# Patient Record
Sex: Male | Born: 1940 | Race: White | Hispanic: No | Marital: Married | Smoking: Former smoker
Health system: Southern US, Community
[De-identification: ages and names within clinical notes are randomized; demographics above are authoritative.]

## PROBLEM LIST (undated history)

## (undated) DIAGNOSIS — M1990 Unspecified osteoarthritis, unspecified site: Secondary | ICD-10-CM

## (undated) DIAGNOSIS — F909 Attention-deficit hyperactivity disorder, unspecified type: Secondary | ICD-10-CM

## (undated) DIAGNOSIS — F32A Depression, unspecified: Secondary | ICD-10-CM

## (undated) DIAGNOSIS — K635 Polyp of colon: Secondary | ICD-10-CM

## (undated) DIAGNOSIS — N200 Calculus of kidney: Secondary | ICD-10-CM

## (undated) DIAGNOSIS — I10 Essential (primary) hypertension: Secondary | ICD-10-CM

## (undated) DIAGNOSIS — R7301 Impaired fasting glucose: Secondary | ICD-10-CM

## (undated) DIAGNOSIS — R972 Elevated prostate specific antigen [PSA]: Secondary | ICD-10-CM

## (undated) DIAGNOSIS — C159 Malignant neoplasm of esophagus, unspecified: Secondary | ICD-10-CM

## (undated) DIAGNOSIS — I6529 Occlusion and stenosis of unspecified carotid artery: Secondary | ICD-10-CM

## (undated) DIAGNOSIS — H543 Unqualified visual loss, both eyes: Secondary | ICD-10-CM

## (undated) DIAGNOSIS — F329 Major depressive disorder, single episode, unspecified: Secondary | ICD-10-CM

## (undated) DIAGNOSIS — N400 Enlarged prostate without lower urinary tract symptoms: Secondary | ICD-10-CM

## (undated) DIAGNOSIS — E782 Mixed hyperlipidemia: Secondary | ICD-10-CM

## (undated) HISTORY — DX: Occlusion and stenosis of unspecified carotid artery: I65.29

## (undated) HISTORY — DX: Polyp of colon: K63.5

## (undated) HISTORY — DX: Calculus of kidney: N20.0

## (undated) HISTORY — DX: Impaired fasting glucose: R73.01

## (undated) HISTORY — DX: Essential (primary) hypertension: I10

## (undated) HISTORY — DX: Benign prostatic hyperplasia without lower urinary tract symptoms: N40.0

## (undated) HISTORY — DX: Major depressive disorder, single episode, unspecified: F32.9

## (undated) HISTORY — DX: Mixed hyperlipidemia: E78.2

## (undated) HISTORY — DX: Unqualified visual loss, both eyes: H54.3

## (undated) HISTORY — DX: Elevated prostate specific antigen (PSA): R97.20

## (undated) HISTORY — PX: COLONOSCOPY: SHX174

## (undated) HISTORY — DX: Attention-deficit hyperactivity disorder, unspecified type: F90.9

## (undated) HISTORY — DX: Depression, unspecified: F32.A

## (undated) HISTORY — DX: Unspecified osteoarthritis, unspecified site: M19.90

---
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
## 1995-01-09 HISTORY — PX: LITHOTRIPSY: SUR834

## 1996-01-09 HISTORY — PX: CORONARY ANGIOGRAPHY: CATH118303

## 2002-05-14 ENCOUNTER — Encounter: Admission: RE | Admit: 2002-05-14 | Discharge: 2002-05-14 | Payer: Self-pay | Admitting: Family Medicine

## 2002-05-14 ENCOUNTER — Encounter: Payer: Self-pay | Admitting: Family Medicine

## 2002-05-21 ENCOUNTER — Ambulatory Visit (HOSPITAL_COMMUNITY): Admission: RE | Admit: 2002-05-21 | Discharge: 2002-05-21 | Payer: Self-pay | Admitting: Gastroenterology

## 2002-05-21 ENCOUNTER — Encounter (INDEPENDENT_AMBULATORY_CARE_PROVIDER_SITE_OTHER): Payer: Self-pay | Admitting: Specialist

## 2002-05-23 ENCOUNTER — Ambulatory Visit (HOSPITAL_BASED_OUTPATIENT_CLINIC_OR_DEPARTMENT_OTHER): Admission: RE | Admit: 2002-05-23 | Discharge: 2002-05-23 | Payer: Self-pay | Admitting: Family Medicine

## 2002-05-26 ENCOUNTER — Encounter: Admission: RE | Admit: 2002-05-26 | Discharge: 2002-05-26 | Payer: Self-pay | Admitting: Family Medicine

## 2002-05-26 ENCOUNTER — Encounter: Payer: Self-pay | Admitting: Family Medicine

## 2002-12-17 ENCOUNTER — Encounter: Admission: RE | Admit: 2002-12-17 | Discharge: 2002-12-17 | Payer: Self-pay | Admitting: Family Medicine

## 2008-01-03 ENCOUNTER — Encounter: Admission: RE | Admit: 2008-01-03 | Discharge: 2008-01-03 | Payer: Self-pay | Admitting: Family Medicine

## 2008-01-09 HISTORY — PX: REPLACEMENT TOTAL KNEE: SUR1224

## 2008-09-22 ENCOUNTER — Inpatient Hospital Stay (HOSPITAL_COMMUNITY): Admission: RE | Admit: 2008-09-22 | Discharge: 2008-09-25 | Payer: Self-pay | Admitting: Orthopedic Surgery

## 2009-11-15 ENCOUNTER — Encounter: Admission: RE | Admit: 2009-11-15 | Discharge: 2009-11-15 | Payer: Self-pay | Admitting: Family Medicine

## 2010-04-14 LAB — CBC
HCT: 24.5 % — ABNORMAL LOW (ref 39.0–52.0)
HCT: 28.1 % — ABNORMAL LOW (ref 39.0–52.0)
HCT: 30.4 % — ABNORMAL LOW (ref 39.0–52.0)
HCT: 41.5 % (ref 39.0–52.0)
Hemoglobin: 10.6 g/dL — ABNORMAL LOW (ref 13.0–17.0)
Hemoglobin: 8.7 g/dL — ABNORMAL LOW (ref 13.0–17.0)
Hemoglobin: 9.8 g/dL — ABNORMAL LOW (ref 13.0–17.0)
Hemoglobin: 14.3 g/dL (ref 13.0–17.0)
MCHC: 34.9 g/dL (ref 30.0–36.0)
MCHC: 34.9 g/dL (ref 30.0–36.0)
MCHC: 35.5 g/dL (ref 30.0–36.0)
MCHC: 34.3 g/dL (ref 30.0–36.0)
MCV: 92.8 fL (ref 78.0–100.0)
MCV: 94.2 fL (ref 78.0–100.0)
MCV: 95.2 fL (ref 78.0–100.0)
MCV: 95.2 fL (ref 78.0–100.0)
Platelets: 137 10*3/uL — ABNORMAL LOW (ref 150–400)
Platelets: 146 10*3/uL — ABNORMAL LOW (ref 150–400)
Platelets: 159 10*3/uL (ref 150–400)
Platelets: 230 10*3/uL (ref 150–400)
RBC: 2.6 MIL/uL — ABNORMAL LOW (ref 4.22–5.81)
RBC: 3.03 MIL/uL — ABNORMAL LOW (ref 4.22–5.81)
RBC: 3.19 MIL/uL — ABNORMAL LOW (ref 4.22–5.81)
RBC: 4.36 MIL/uL (ref 4.22–5.81)
RDW: 13.4 % (ref 11.5–15.5)
RDW: 13.7 % (ref 11.5–15.5)
RDW: 14.4 % (ref 11.5–15.5)
RDW: 13.5 % (ref 11.5–15.5)
WBC: 10.2 10*3/uL (ref 4.0–10.5)
WBC: 8.8 10*3/uL (ref 4.0–10.5)
WBC: 8.9 10*3/uL (ref 4.0–10.5)
WBC: 6.8 10*3/uL (ref 4.0–10.5)

## 2010-04-14 LAB — URINALYSIS, ROUTINE W REFLEX MICROSCOPIC
Bilirubin Urine: NEGATIVE
Glucose, UA: NEGATIVE mg/dL
Hgb urine dipstick: NEGATIVE
Ketones, ur: NEGATIVE mg/dL
Nitrite: NEGATIVE
Protein, ur: NEGATIVE mg/dL
Specific Gravity, Urine: 1.016 (ref 1.005–1.030)
Urobilinogen, UA: 1 mg/dL (ref 0.0–1.0)
pH: 5.5 (ref 5.0–8.0)

## 2010-04-14 LAB — BASIC METABOLIC PANEL
BUN: 13 mg/dL (ref 6–23)
BUN: 14 mg/dL (ref 6–23)
BUN: 15 mg/dL (ref 6–23)
CO2: 28 mEq/L (ref 19–32)
CO2: 28 mEq/L (ref 19–32)
CO2: 28 mEq/L (ref 19–32)
Calcium: 8.1 mg/dL — ABNORMAL LOW (ref 8.4–10.5)
Calcium: 8.1 mg/dL — ABNORMAL LOW (ref 8.4–10.5)
Calcium: 8.5 mg/dL (ref 8.4–10.5)
Chloride: 102 mEq/L (ref 96–112)
Chloride: 104 mEq/L (ref 96–112)
Chloride: 96 mEq/L (ref 96–112)
Creatinine, Ser: 1.04 mg/dL (ref 0.4–1.5)
Creatinine, Ser: 1.11 mg/dL (ref 0.4–1.5)
Creatinine, Ser: 1.12 mg/dL (ref 0.4–1.5)
GFR calc Af Amer: >60 mL/min (ref >60)
GFR calc Af Amer: >60 mL/min (ref >60)
GFR calc Af Amer: >60 mL/min (ref >60)
GFR calc non Af Amer: >60 mL/min (ref >60)
GFR calc non Af Amer: >60 mL/min (ref >60)
GFR calc non Af Amer: >60 mL/min (ref >60)
Glucose, Bld: 122 mg/dL — ABNORMAL HIGH (ref 70–99)
Glucose, Bld: 138 mg/dL — ABNORMAL HIGH (ref 70–99)
Glucose, Bld: 146 mg/dL — ABNORMAL HIGH (ref 70–99)
Potassium: 3.3 mEq/L — ABNORMAL LOW (ref 3.5–5.1)
Potassium: 3.7 mEq/L (ref 3.5–5.1)
Potassium: 3.9 mEq/L (ref 3.5–5.1)
Sodium: 132 mEq/L — ABNORMAL LOW (ref 135–145)
Sodium: 138 mEq/L (ref 135–145)
Sodium: 138 mEq/L (ref 135–145)

## 2010-04-14 LAB — COMPREHENSIVE METABOLIC PANEL
ALT: 36 U/L (ref 0–53)
AST: 37 U/L (ref 0–37)
Albumin: 4.4 g/dL (ref 3.5–5.2)
Alkaline Phosphatase: 72 U/L (ref 39–117)
BUN: 12 mg/dL (ref 6–23)
CO2: 27 mEq/L (ref 19–32)
Calcium: 9.5 mg/dL (ref 8.4–10.5)
Chloride: 103 mEq/L (ref 96–112)
Creatinine, Ser: 0.99 mg/dL (ref 0.4–1.5)
GFR calc Af Amer: >60 mL/min (ref >60)
GFR calc non Af Amer: >60 mL/min (ref >60)
Glucose, Bld: 116 mg/dL — ABNORMAL HIGH (ref 70–99)
Potassium: 4.4 mEq/L (ref 3.5–5.1)
Sodium: 137 mEq/L (ref 135–145)
Total Bilirubin: 0.7 mg/dL (ref 0.3–1.2)
Total Protein: 7.1 g/dL (ref 6.0–8.3)

## 2010-04-14 LAB — TYPE AND SCREEN
ABO/RH(D): O NEG
Antibody Screen: NEGATIVE

## 2010-04-14 LAB — PREPARE RBC (CROSSMATCH)

## 2010-04-14 LAB — PROTIME-INR
INR: 1.1 (ref 0.00–1.49)
Prothrombin Time: 14.2 seconds (ref 11.6–15.2)

## 2010-04-14 LAB — APTT: aPTT: 30 seconds (ref 24–37)

## 2010-04-14 LAB — ABO/RH: ABO/RH(D): O NEG

## 2010-05-26 NOTE — Op Note (Signed)
NAME:  Colin Villa, Colin Villa                          ACCOUNT NO.:  [id number]   MEDICAL RECORD NO.:  [id number]                   PATIENT TYPE:  AMB   LOCATION:  ENDO                                 FACILITY:  [facility]   PHYSICIAN:  Petra Kuba, M.D.                 DATE OF BIRTH:  November 06, 1940   DATE OF PROCEDURE:  05/21/2002  DATE OF DISCHARGE:                                 OPERATIVE REPORT   PROCEDURE:  Colonoscopy and polypectomy.   INDICATION:  Mild anemia.  Consent was signed after risks, benefits,  methods, options thoroughly discussed in the office.   MEDICINES USED:  1. Demerol 50.  2. Versed 5.   DESCRIPTION OF PROCEDURE:  Rectal inspection was pertinent for external  hemorrhoids, small.  Digital exam was negative.  The video colonoscope was  inserted, easily advanced around the colon to the cecum.  This did require  some left-sided pressure but no position changes.  No obvious abnormalities  were seen on insertion.  The cecum was identified by the appendiceal orifice  as well as the ileocecal valve.  In fact, the scope was inserted a short  ways into the terminal ileum which was normal.  Photodocumentation was  obtained.  The scope was slowly withdrawn.  Prep was adequate.  There was  some liquid stool that required washing and suctioning but on slow  withdrawal through the colon, the cecum, ascending, and transverse were  normal.  The scope was withdrawn around the descending.  Three tiny to small  polyps were seen and were all hot biopsied x 1.  The scope was further  withdrawn.  No sigmoid abnormalities were seen.  However, in the proximal  rectum, another small polyp was seen and was hot biopsied x 2.  The scope  was then retroflexed, pertinent for some internal hemorrhoids.  The scope  was straightened and readvanced a short ways up the left side of the colon;  air was suctioned and the scope removed.  The patient tolerated the  procedure well.  There was no obvious  immediate complication.   ENDOSCOPIC DIAGNOSES:  1. Internal-external hemorrhoids.  2. Four tiny to small polyps, hot biopsied; one in the proximal rectum,     three in the descending.  3. Otherwise within normal limits to the terminal ileum.    PLAN:  1. Await pathology to determine future colonic screening.  2. Follow up p.r.n. or in two months to recheck guaiacs, CBC, symptoms, make     sure no further work-up plans are needed.  In the meantime, await CAT     scan to be done next week for his abnormal kidney ultrasound.  Petra Kuba, M.D.    MEM/MEDQ  D:  05/21/2002  T:  05/21/2002  Job:  [id number]   cc:   Chales Salmon. Abigail Miyamoto, M.D.  [address]  [place]  [place] [address]  Fax: [phone]

## 2011-01-31 DIAGNOSIS — F322 Major depressive disorder, single episode, severe without psychotic features: Secondary | ICD-10-CM | POA: Diagnosis not present

## 2011-03-07 DIAGNOSIS — F322 Major depressive disorder, single episode, severe without psychotic features: Secondary | ICD-10-CM | POA: Diagnosis not present

## 2011-04-04 DIAGNOSIS — R7301 Impaired fasting glucose: Secondary | ICD-10-CM | POA: Diagnosis not present

## 2011-04-20 DIAGNOSIS — T8489XA Other specified complication of internal orthopedic prosthetic devices, implants and grafts, initial encounter: Secondary | ICD-10-CM | POA: Diagnosis not present

## 2011-05-22 DIAGNOSIS — T8489XA Other specified complication of internal orthopedic prosthetic devices, implants and grafts, initial encounter: Secondary | ICD-10-CM | POA: Diagnosis not present

## 2011-05-22 DIAGNOSIS — F322 Major depressive disorder, single episode, severe without psychotic features: Secondary | ICD-10-CM | POA: Diagnosis not present

## 2011-06-11 DIAGNOSIS — R972 Elevated prostate specific antigen [PSA]: Secondary | ICD-10-CM | POA: Diagnosis not present

## 2011-07-18 DIAGNOSIS — N401 Enlarged prostate with lower urinary tract symptoms: Secondary | ICD-10-CM | POA: Diagnosis not present

## 2011-07-18 DIAGNOSIS — R972 Elevated prostate specific antigen [PSA]: Secondary | ICD-10-CM | POA: Diagnosis not present

## 2011-08-14 DIAGNOSIS — F322 Major depressive disorder, single episode, severe without psychotic features: Secondary | ICD-10-CM | POA: Diagnosis not present

## 2011-08-22 DIAGNOSIS — F322 Major depressive disorder, single episode, severe without psychotic features: Secondary | ICD-10-CM | POA: Diagnosis not present

## 2011-09-25 DIAGNOSIS — R7301 Impaired fasting glucose: Secondary | ICD-10-CM | POA: Diagnosis not present

## 2011-09-25 DIAGNOSIS — Z125 Encounter for screening for malignant neoplasm of prostate: Secondary | ICD-10-CM | POA: Diagnosis not present

## 2011-09-25 DIAGNOSIS — R799 Abnormal finding of blood chemistry, unspecified: Secondary | ICD-10-CM | POA: Diagnosis not present

## 2011-09-25 DIAGNOSIS — I10 Essential (primary) hypertension: Secondary | ICD-10-CM | POA: Diagnosis not present

## 2011-09-26 DIAGNOSIS — Z0000 Encounter for general adult medical examination without abnormal findings: Secondary | ICD-10-CM | POA: Diagnosis not present

## 2011-09-26 DIAGNOSIS — R5381 Other malaise: Secondary | ICD-10-CM | POA: Diagnosis not present

## 2011-09-26 DIAGNOSIS — I10 Essential (primary) hypertension: Secondary | ICD-10-CM | POA: Diagnosis not present

## 2011-09-26 DIAGNOSIS — Z23 Encounter for immunization: Secondary | ICD-10-CM | POA: Diagnosis not present

## 2011-09-26 DIAGNOSIS — F322 Major depressive disorder, single episode, severe without psychotic features: Secondary | ICD-10-CM | POA: Diagnosis not present

## 2011-09-26 DIAGNOSIS — E782 Mixed hyperlipidemia: Secondary | ICD-10-CM | POA: Diagnosis not present

## 2011-09-26 DIAGNOSIS — R972 Elevated prostate specific antigen [PSA]: Secondary | ICD-10-CM | POA: Diagnosis not present

## 2011-09-26 DIAGNOSIS — R7301 Impaired fasting glucose: Secondary | ICD-10-CM | POA: Diagnosis not present

## 2012-01-16 DIAGNOSIS — R972 Elevated prostate specific antigen [PSA]: Secondary | ICD-10-CM | POA: Diagnosis not present

## 2012-03-13 DIAGNOSIS — F322 Major depressive disorder, single episode, severe without psychotic features: Secondary | ICD-10-CM | POA: Diagnosis not present

## 2012-04-10 DIAGNOSIS — H2510 Age-related nuclear cataract, unspecified eye: Secondary | ICD-10-CM | POA: Diagnosis not present

## 2012-04-30 DIAGNOSIS — N200 Calculus of kidney: Secondary | ICD-10-CM | POA: Diagnosis not present

## 2012-04-30 DIAGNOSIS — R5383 Other fatigue: Secondary | ICD-10-CM | POA: Diagnosis not present

## 2012-04-30 DIAGNOSIS — E782 Mixed hyperlipidemia: Secondary | ICD-10-CM | POA: Diagnosis not present

## 2012-04-30 DIAGNOSIS — N400 Enlarged prostate without lower urinary tract symptoms: Secondary | ICD-10-CM | POA: Diagnosis not present

## 2012-04-30 DIAGNOSIS — I10 Essential (primary) hypertension: Secondary | ICD-10-CM | POA: Diagnosis not present

## 2012-04-30 DIAGNOSIS — R7301 Impaired fasting glucose: Secondary | ICD-10-CM | POA: Diagnosis not present

## 2012-04-30 DIAGNOSIS — R5381 Other malaise: Secondary | ICD-10-CM | POA: Diagnosis not present

## 2012-08-13 DIAGNOSIS — L821 Other seborrheic keratosis: Secondary | ICD-10-CM | POA: Diagnosis not present

## 2012-08-13 DIAGNOSIS — L738 Other specified follicular disorders: Secondary | ICD-10-CM | POA: Diagnosis not present

## 2012-08-13 DIAGNOSIS — L219 Seborrheic dermatitis, unspecified: Secondary | ICD-10-CM | POA: Diagnosis not present

## 2012-08-13 DIAGNOSIS — L570 Actinic keratosis: Secondary | ICD-10-CM | POA: Diagnosis not present

## 2012-08-13 DIAGNOSIS — D485 Neoplasm of uncertain behavior of skin: Secondary | ICD-10-CM | POA: Diagnosis not present

## 2012-09-03 DIAGNOSIS — F322 Major depressive disorder, single episode, severe without psychotic features: Secondary | ICD-10-CM | POA: Diagnosis not present

## 2012-10-10 DIAGNOSIS — N200 Calculus of kidney: Secondary | ICD-10-CM | POA: Diagnosis not present

## 2012-10-10 DIAGNOSIS — I10 Essential (primary) hypertension: Secondary | ICD-10-CM | POA: Diagnosis not present

## 2012-10-10 DIAGNOSIS — M1990 Unspecified osteoarthritis, unspecified site: Secondary | ICD-10-CM | POA: Diagnosis not present

## 2012-10-10 DIAGNOSIS — Z0000 Encounter for general adult medical examination without abnormal findings: Secondary | ICD-10-CM | POA: Diagnosis not present

## 2012-10-10 DIAGNOSIS — Z125 Encounter for screening for malignant neoplasm of prostate: Secondary | ICD-10-CM | POA: Diagnosis not present

## 2012-10-10 DIAGNOSIS — Z1331 Encounter for screening for depression: Secondary | ICD-10-CM | POA: Diagnosis not present

## 2012-10-10 DIAGNOSIS — D126 Benign neoplasm of colon, unspecified: Secondary | ICD-10-CM | POA: Diagnosis not present

## 2012-10-10 DIAGNOSIS — E782 Mixed hyperlipidemia: Secondary | ICD-10-CM | POA: Diagnosis not present

## 2012-10-10 DIAGNOSIS — R7301 Impaired fasting glucose: Secondary | ICD-10-CM | POA: Diagnosis not present

## 2012-10-10 DIAGNOSIS — F329 Major depressive disorder, single episode, unspecified: Secondary | ICD-10-CM | POA: Diagnosis not present

## 2012-10-16 DIAGNOSIS — Z23 Encounter for immunization: Secondary | ICD-10-CM | POA: Diagnosis not present

## 2012-10-28 DIAGNOSIS — R972 Elevated prostate specific antigen [PSA]: Secondary | ICD-10-CM | POA: Diagnosis not present

## 2012-11-18 DIAGNOSIS — R972 Elevated prostate specific antigen [PSA]: Secondary | ICD-10-CM | POA: Diagnosis not present

## 2012-11-27 DIAGNOSIS — R972 Elevated prostate specific antigen [PSA]: Secondary | ICD-10-CM | POA: Diagnosis not present

## 2012-12-23 DIAGNOSIS — Z299 Encounter for prophylactic measures, unspecified: Secondary | ICD-10-CM | POA: Diagnosis not present

## 2012-12-23 DIAGNOSIS — R972 Elevated prostate specific antigen [PSA]: Secondary | ICD-10-CM | POA: Diagnosis not present

## 2013-02-05 DIAGNOSIS — F322 Major depressive disorder, single episode, severe without psychotic features: Secondary | ICD-10-CM | POA: Diagnosis not present

## 2013-05-19 DIAGNOSIS — F322 Major depressive disorder, single episode, severe without psychotic features: Secondary | ICD-10-CM | POA: Diagnosis not present

## 2013-06-16 DIAGNOSIS — R972 Elevated prostate specific antigen [PSA]: Secondary | ICD-10-CM | POA: Diagnosis not present

## 2013-06-16 DIAGNOSIS — N401 Enlarged prostate with lower urinary tract symptoms: Secondary | ICD-10-CM | POA: Diagnosis not present

## 2013-06-16 DIAGNOSIS — N139 Obstructive and reflux uropathy, unspecified: Secondary | ICD-10-CM | POA: Diagnosis not present

## 2013-06-23 DIAGNOSIS — N401 Enlarged prostate with lower urinary tract symptoms: Secondary | ICD-10-CM | POA: Diagnosis not present

## 2013-06-23 DIAGNOSIS — N139 Obstructive and reflux uropathy, unspecified: Secondary | ICD-10-CM | POA: Diagnosis not present

## 2013-06-23 DIAGNOSIS — R351 Nocturia: Secondary | ICD-10-CM | POA: Diagnosis not present

## 2013-07-12 DIAGNOSIS — R05 Cough: Secondary | ICD-10-CM | POA: Diagnosis not present

## 2013-07-12 DIAGNOSIS — R059 Cough, unspecified: Secondary | ICD-10-CM | POA: Diagnosis not present

## 2013-07-23 DIAGNOSIS — F322 Major depressive disorder, single episode, severe without psychotic features: Secondary | ICD-10-CM | POA: Diagnosis not present

## 2013-08-11 DIAGNOSIS — H2510 Age-related nuclear cataract, unspecified eye: Secondary | ICD-10-CM | POA: Diagnosis not present

## 2013-10-06 DIAGNOSIS — D126 Benign neoplasm of colon, unspecified: Secondary | ICD-10-CM | POA: Diagnosis not present

## 2013-10-06 DIAGNOSIS — Z09 Encounter for follow-up examination after completed treatment for conditions other than malignant neoplasm: Secondary | ICD-10-CM | POA: Diagnosis not present

## 2013-10-06 DIAGNOSIS — Z86010 Personal history of colonic polyps: Secondary | ICD-10-CM | POA: Diagnosis not present

## 2013-10-06 DIAGNOSIS — Z1211 Encounter for screening for malignant neoplasm of colon: Secondary | ICD-10-CM | POA: Diagnosis not present

## 2013-10-21 DIAGNOSIS — N200 Calculus of kidney: Secondary | ICD-10-CM | POA: Diagnosis not present

## 2013-10-21 DIAGNOSIS — K635 Polyp of colon: Secondary | ICD-10-CM | POA: Diagnosis not present

## 2013-10-21 DIAGNOSIS — M1990 Unspecified osteoarthritis, unspecified site: Secondary | ICD-10-CM | POA: Diagnosis not present

## 2013-10-21 DIAGNOSIS — E782 Mixed hyperlipidemia: Secondary | ICD-10-CM | POA: Diagnosis not present

## 2013-10-21 DIAGNOSIS — I10 Essential (primary) hypertension: Secondary | ICD-10-CM | POA: Diagnosis not present

## 2013-10-21 DIAGNOSIS — R7301 Impaired fasting glucose: Secondary | ICD-10-CM | POA: Diagnosis not present

## 2013-10-21 DIAGNOSIS — Z0000 Encounter for general adult medical examination without abnormal findings: Secondary | ICD-10-CM | POA: Diagnosis not present

## 2013-10-21 DIAGNOSIS — F329 Major depressive disorder, single episode, unspecified: Secondary | ICD-10-CM | POA: Diagnosis not present

## 2013-10-21 DIAGNOSIS — Z23 Encounter for immunization: Secondary | ICD-10-CM | POA: Diagnosis not present

## 2013-11-12 DIAGNOSIS — F322 Major depressive disorder, single episode, severe without psychotic features: Secondary | ICD-10-CM | POA: Diagnosis not present

## 2013-12-15 DIAGNOSIS — L853 Xerosis cutis: Secondary | ICD-10-CM | POA: Diagnosis not present

## 2013-12-15 DIAGNOSIS — L218 Other seborrheic dermatitis: Secondary | ICD-10-CM | POA: Diagnosis not present

## 2013-12-15 DIAGNOSIS — L821 Other seborrheic keratosis: Secondary | ICD-10-CM | POA: Diagnosis not present

## 2013-12-15 DIAGNOSIS — Z85828 Personal history of other malignant neoplasm of skin: Secondary | ICD-10-CM | POA: Diagnosis not present

## 2013-12-15 DIAGNOSIS — L570 Actinic keratosis: Secondary | ICD-10-CM | POA: Diagnosis not present

## 2013-12-31 ENCOUNTER — Encounter (HOSPITAL_BASED_OUTPATIENT_CLINIC_OR_DEPARTMENT_OTHER): Payer: Self-pay

## 2013-12-31 DIAGNOSIS — Y998 Other external cause status: Secondary | ICD-10-CM | POA: Diagnosis not present

## 2013-12-31 DIAGNOSIS — Y9289 Other specified places as the place of occurrence of the external cause: Secondary | ICD-10-CM | POA: Insufficient documentation

## 2013-12-31 DIAGNOSIS — Y9389 Activity, other specified: Secondary | ICD-10-CM | POA: Diagnosis not present

## 2013-12-31 DIAGNOSIS — S6991XA Unspecified injury of right wrist, hand and finger(s), initial encounter: Secondary | ICD-10-CM | POA: Diagnosis present

## 2013-12-31 DIAGNOSIS — I10 Essential (primary) hypertension: Secondary | ICD-10-CM | POA: Insufficient documentation

## 2013-12-31 DIAGNOSIS — S61200A Unspecified open wound of right index finger without damage to nail, initial encounter: Secondary | ICD-10-CM | POA: Diagnosis not present

## 2013-12-31 DIAGNOSIS — W452XXA Lid of can entering through skin, initial encounter: Secondary | ICD-10-CM | POA: Diagnosis not present

## 2013-12-31 DIAGNOSIS — S61210A Laceration without foreign body of right index finger without damage to nail, initial encounter: Secondary | ICD-10-CM | POA: Diagnosis not present

## 2013-12-31 NOTE — ED Notes (Addendum)
Right index finger lac-cut on can approx 30 min PTA-slight bleeding noted-gauze/kling dsg applied in triage

## 2014-01-01 ENCOUNTER — Emergency Department (HOSPITAL_BASED_OUTPATIENT_CLINIC_OR_DEPARTMENT_OTHER)
Admission: EM | Admit: 2014-01-01 | Discharge: 2014-01-01 | Disposition: A | Discharge status: Home / Self Care | Payer: Medicare Other | Attending: Emergency Medicine | Admitting: Emergency Medicine

## 2014-01-01 DIAGNOSIS — S61210A Laceration without foreign body of right index finger without damage to nail, initial encounter: Secondary | ICD-10-CM | POA: Diagnosis not present

## 2014-01-01 HISTORY — DX: Essential (primary) hypertension: I10

## 2014-01-01 MED ORDER — LIDOCAINE HCL (PF) 1 % IJ SOLN
5.0000 mL | Freq: Once | INTRAMUSCULAR | Status: AC
  Administered 2014-01-01: 5 mL

## 2014-01-01 MED ORDER — LIDOCAINE HCL (PF) 1 % IJ SOLN
INTRAMUSCULAR | Status: AC
  Administered 2014-01-01: 5 mL
  Filled 2014-01-01: qty 5

## 2014-01-01 NOTE — ED Provider Notes (Signed)
CSN: [id number]     Arrival date & time 12/31/13  2234 History   First MD Initiated Contact with Patient 01/01/14 0133     Chief Complaint  Patient presents with  . Extremity Laceration     (Consider location/radiation/quality/duration/timing/severity/associated sxs/prior Treatment) The history is provided by the patient.   73 year old male suffered a laceration to his right second finger from the lid of a can. He is up-to-date on tetanus immunizations. He was unable to get bleeding controlled at home so he came to the ED.  Past Medical History  Diagnosis Date  . Hypertension    Past Surgical History  Procedure Laterality Date  . Knee surgery     No family history on file. History  Substance Use Topics  . Smoking status: Never Smoker   . Smokeless tobacco: Not on file  . Alcohol Use: No    Review of Systems  All other systems reviewed and are negative.     Allergies  Sulfa antibiotics  Home Medications   Prior to Admission medications   Medication Sig Start Date End Date Taking? Authorizing Provider  AMLODIPINE BESYLATE PO Take by mouth.   Yes Historical Provider, MD  CLONIDINE HCL PO Take by mouth.   Yes Historical Provider, MD  PARoxetine HCl (PAXIL PO) Take by mouth.   Yes Historical Provider, MD   BP 146/74 mmHg  Pulse 56  Temp(Src) 97.9 F (36.6 C) (Oral)  Resp 16  Ht 5\' 7"  (1.702 m)  Wt 185 lb (83.915 kg)  BMI 28.97 kg/m2  SpO2 99% Physical Exam  Nursing note and vitals reviewed.  73 year old male, resting comfortably and in no acute distress. Vital signs are significant for mild hypertension and mild bradycardia. Oxygen saturation is 99%, which is normal. Head is normocephalic and atraumatic. PERRLA, EOMI. Oropharynx is clear. Neck is nontender and supple without adenopathy or JVD. Back is nontender and there is no CVA tenderness. Lungs are clear without rales, wheezes, or rhonchi. Chest is nontender. Heart has regular rate and rhythm without  murmur. Abdomen is soft, flat, nontender without masses or hepatosplenomegaly and peristalsis is normoactive. Extremities laceration is present over the volar surface of the right second finger middle phalanx. Laceration is oriented transversely. Distal sensation is normal and flexor tendon function is normal. Skin is warm and dry without rash. Neurologic: Mental status is normal, cranial nerves are intact, there are no motor or sensory deficits.  ED Course  Procedures (including critical care time) LACERATION REPAIR Performed by: TOIZT,IWPYK Authorized by: DXIPJ,ASNKN Consent: Verbal consent obtained. Risks and benefits: risks, benefits and alternatives were discussed Consent given by: patient Patient identity confirmed: provided demographic data Prepped and Draped in normal sterile fashion Wound explored  Laceration Location: Left second finger  Laceration Length: 4.0 cm  No Foreign Bodies seen or palpated  Anesthesia: local infiltration  Local anesthetic: lidocaine 1% without epinephrine  Anesthetic total: 3 ml  IAmount of cleaning: standard  Skin closure: Close   Number of sutures: 9  Technique: Simple interrupted sutures with 5-0 nylon   Patient tolerance: Patient tolerated the procedure well with no immediate complications.   MDM   Final diagnoses:  Laceration of second finger, right, initial encounter    Laceration of right second finger with suture repair.    Delora Fuel, MD 39/76/73 [id number]

## 2014-01-01 NOTE — Discharge Instructions (Signed)
Stitches need to be removed in 7-10 days. This can be done at her doctor's office, at an urgent care center, or at the emergency department.  Laceration Care, Adult A laceration is a cut or lesion that goes through all layers of the skin and into the tissue just beneath the skin. TREATMENT  Some lacerations may not require closure. Some lacerations may not be able to be closed due to an increased risk of infection. It is important to see your caregiver as soon as possible after an injury to minimize the risk of infection and maximize the opportunity for successful closure. If closure is appropriate, pain medicines may be given, if needed. The wound will be cleaned to help prevent infection. Your caregiver will use stitches (sutures), staples, wound glue (adhesive), or skin adhesive strips to repair the laceration. These tools bring the skin edges together to allow for faster healing and a better cosmetic outcome. However, all wounds will heal with a scar. Once the wound has healed, scarring can be minimized by covering the wound with sunscreen during the day for 1 full year. HOME CARE INSTRUCTIONS  For sutures or staples:  Keep the wound clean and dry.  If you were given a bandage (dressing), you should change it at least once a day. Also, change the dressing if it becomes wet or dirty, or as directed by your caregiver.  Wash the wound with soap and water 2 times a day. Rinse the wound off with water to remove all soap. Pat the wound dry with a clean towel.  After cleaning, apply a thin layer of the antibiotic ointment as recommended by your caregiver. This will help prevent infection and keep the dressing from sticking.  You may shower as usual after the first 24 hours. Do not soak the wound in water until the sutures are removed.  Only take over-the-counter or prescription medicines for pain, discomfort, or fever as directed by your caregiver.  Get your sutures or staples removed as directed  by your caregiver. For skin adhesive strips:  Keep the wound clean and dry.  Do not get the skin adhesive strips wet. You may bathe carefully, using caution to keep the wound dry.  If the wound gets wet, pat it dry with a clean towel.  Skin adhesive strips will fall off on their own. You may trim the strips as the wound heals. Do not remove skin adhesive strips that are still stuck to the wound. They will fall off in time. For wound adhesive:  You may briefly wet your wound in the shower or bath. Do not soak or scrub the wound. Do not swim. Avoid periods of heavy perspiration until the skin adhesive has fallen off on its own. After showering or bathing, gently pat the wound dry with a clean towel.  Do not apply liquid medicine, cream medicine, or ointment medicine to your wound while the skin adhesive is in place. This may loosen the film before your wound is healed.  If a dressing is placed over the wound, be careful not to apply tape directly over the skin adhesive. This may cause the adhesive to be pulled off before the wound is healed.  Avoid prolonged exposure to sunlight or tanning lamps while the skin adhesive is in place. Exposure to ultraviolet light in the first year will darken the scar.  The skin adhesive will usually remain in place for 5 to 10 days, then naturally fall off the skin. Do not pick at the  adhesive film. You may need a tetanus shot if:  You cannot remember when you had your last tetanus shot.  You have never had a tetanus shot. If you get a tetanus shot, your arm may swell, get red, and feel warm to the touch. This is common and not a problem. If you need a tetanus shot and you choose not to have one, there is a rare chance of getting tetanus. Sickness from tetanus can be serious. SEEK MEDICAL CARE IF:   You have redness, swelling, or increasing pain in the wound.  You see a red line that goes away from the wound.  You have yellowish-white fluid (pus) coming  from the wound.  You have a fever.  You notice a bad smell coming from the wound or dressing.  Your wound breaks open before or after sutures have been removed.  You notice something coming out of the wound such as wood or glass.  Your wound is on your hand or foot and you cannot move a finger or toe. SEEK IMMEDIATE MEDICAL CARE IF:   Your pain is not controlled with prescribed medicine.  You have severe swelling around the wound causing pain and numbness or a change in color in your arm, hand, leg, or foot.  Your wound splits open and starts bleeding.  You have worsening numbness, weakness, or loss of function of any joint around or beyond the wound.  You develop painful lumps near the wound or on the skin anywhere on your body. MAKE SURE YOU:   Understand these instructions.  Will watch your condition.  Will get help right away if you are not doing well or get worse. Document Released: 12/25/2004 Document Revised: 03/19/2011 Document Reviewed: 06/20/2010 [facility] Patient Information 2015 [place], [place]. This information is not intended to replace advice given to you by your health care provider. Make sure you discuss any questions you have with your health care provider.

## 2014-01-01 NOTE — ED Notes (Signed)
MD at bedside. 

## 2014-01-04 DIAGNOSIS — Z23 Encounter for immunization: Secondary | ICD-10-CM | POA: Diagnosis not present

## 2014-05-19 DIAGNOSIS — F322 Major depressive disorder, single episode, severe without psychotic features: Secondary | ICD-10-CM | POA: Diagnosis not present

## 2014-06-17 DIAGNOSIS — R351 Nocturia: Secondary | ICD-10-CM | POA: Diagnosis not present

## 2014-06-17 DIAGNOSIS — N401 Enlarged prostate with lower urinary tract symptoms: Secondary | ICD-10-CM | POA: Diagnosis not present

## 2014-06-17 DIAGNOSIS — Z125 Encounter for screening for malignant neoplasm of prostate: Secondary | ICD-10-CM | POA: Diagnosis not present

## 2014-06-24 DIAGNOSIS — R972 Elevated prostate specific antigen [PSA]: Secondary | ICD-10-CM | POA: Diagnosis not present

## 2014-06-24 DIAGNOSIS — N401 Enlarged prostate with lower urinary tract symptoms: Secondary | ICD-10-CM | POA: Diagnosis not present

## 2014-06-24 DIAGNOSIS — R351 Nocturia: Secondary | ICD-10-CM | POA: Diagnosis not present

## 2014-09-07 DIAGNOSIS — F322 Major depressive disorder, single episode, severe without psychotic features: Secondary | ICD-10-CM | POA: Diagnosis not present

## 2014-11-09 DIAGNOSIS — H40012 Open angle with borderline findings, low risk, left eye: Secondary | ICD-10-CM | POA: Diagnosis not present

## 2014-11-09 DIAGNOSIS — H2513 Age-related nuclear cataract, bilateral: Secondary | ICD-10-CM | POA: Diagnosis not present

## 2014-11-09 DIAGNOSIS — D2312 Other benign neoplasm of skin of left eyelid, including canthus: Secondary | ICD-10-CM | POA: Diagnosis not present

## 2014-11-09 DIAGNOSIS — H43811 Vitreous degeneration, right eye: Secondary | ICD-10-CM | POA: Diagnosis not present

## 2014-11-09 DIAGNOSIS — H25013 Cortical age-related cataract, bilateral: Secondary | ICD-10-CM | POA: Diagnosis not present

## 2014-11-09 DIAGNOSIS — H40011 Open angle with borderline findings, low risk, right eye: Secondary | ICD-10-CM | POA: Diagnosis not present

## 2014-11-17 DIAGNOSIS — Z0000 Encounter for general adult medical examination without abnormal findings: Secondary | ICD-10-CM | POA: Diagnosis not present

## 2014-11-17 DIAGNOSIS — N200 Calculus of kidney: Secondary | ICD-10-CM | POA: Diagnosis not present

## 2014-11-17 DIAGNOSIS — N400 Enlarged prostate without lower urinary tract symptoms: Secondary | ICD-10-CM | POA: Diagnosis not present

## 2014-11-17 DIAGNOSIS — I10 Essential (primary) hypertension: Secondary | ICD-10-CM | POA: Diagnosis not present

## 2014-11-17 DIAGNOSIS — Z23 Encounter for immunization: Secondary | ICD-10-CM | POA: Diagnosis not present

## 2014-11-17 DIAGNOSIS — R7301 Impaired fasting glucose: Secondary | ICD-10-CM | POA: Diagnosis not present

## 2014-11-17 DIAGNOSIS — F329 Major depressive disorder, single episode, unspecified: Secondary | ICD-10-CM | POA: Diagnosis not present

## 2014-11-17 DIAGNOSIS — L570 Actinic keratosis: Secondary | ICD-10-CM | POA: Diagnosis not present

## 2014-11-17 DIAGNOSIS — E786 Lipoprotein deficiency: Secondary | ICD-10-CM | POA: Diagnosis not present

## 2014-11-17 DIAGNOSIS — Z8052 Family history of malignant neoplasm of bladder: Secondary | ICD-10-CM | POA: Diagnosis not present

## 2014-11-17 DIAGNOSIS — D126 Benign neoplasm of colon, unspecified: Secondary | ICD-10-CM | POA: Diagnosis not present

## 2014-11-17 DIAGNOSIS — F909 Attention-deficit hyperactivity disorder, unspecified type: Secondary | ICD-10-CM | POA: Diagnosis not present

## 2014-11-29 DIAGNOSIS — F322 Major depressive disorder, single episode, severe without psychotic features: Secondary | ICD-10-CM | POA: Diagnosis not present

## 2014-12-21 DIAGNOSIS — L218 Other seborrheic dermatitis: Secondary | ICD-10-CM | POA: Diagnosis not present

## 2014-12-21 DIAGNOSIS — L281 Prurigo nodularis: Secondary | ICD-10-CM | POA: Diagnosis not present

## 2014-12-21 DIAGNOSIS — L720 Epidermal cyst: Secondary | ICD-10-CM | POA: Diagnosis not present

## 2014-12-21 DIAGNOSIS — L821 Other seborrheic keratosis: Secondary | ICD-10-CM | POA: Diagnosis not present

## 2014-12-21 DIAGNOSIS — L570 Actinic keratosis: Secondary | ICD-10-CM | POA: Diagnosis not present

## 2014-12-21 DIAGNOSIS — L814 Other melanin hyperpigmentation: Secondary | ICD-10-CM | POA: Diagnosis not present

## 2014-12-21 DIAGNOSIS — Z85828 Personal history of other malignant neoplasm of skin: Secondary | ICD-10-CM | POA: Diagnosis not present

## 2014-12-21 DIAGNOSIS — L853 Xerosis cutis: Secondary | ICD-10-CM | POA: Diagnosis not present

## 2015-02-21 DIAGNOSIS — H40013 Open angle with borderline findings, low risk, bilateral: Secondary | ICD-10-CM | POA: Diagnosis not present

## 2015-02-21 DIAGNOSIS — F322 Major depressive disorder, single episode, severe without psychotic features: Secondary | ICD-10-CM | POA: Diagnosis not present

## 2015-03-24 DIAGNOSIS — E782 Mixed hyperlipidemia: Secondary | ICD-10-CM | POA: Diagnosis not present

## 2015-03-24 DIAGNOSIS — Z79899 Other long term (current) drug therapy: Secondary | ICD-10-CM | POA: Diagnosis not present

## 2015-03-24 DIAGNOSIS — R7301 Impaired fasting glucose: Secondary | ICD-10-CM | POA: Diagnosis not present

## 2015-08-22 DIAGNOSIS — F322 Major depressive disorder, single episode, severe without psychotic features: Secondary | ICD-10-CM | POA: Diagnosis not present

## 2015-10-17 DIAGNOSIS — F322 Major depressive disorder, single episode, severe without psychotic features: Secondary | ICD-10-CM | POA: Diagnosis not present

## 2015-11-10 DIAGNOSIS — I10 Essential (primary) hypertension: Secondary | ICD-10-CM | POA: Diagnosis not present

## 2015-11-10 DIAGNOSIS — R42 Dizziness and giddiness: Secondary | ICD-10-CM | POA: Diagnosis not present

## 2015-11-10 DIAGNOSIS — Z23 Encounter for immunization: Secondary | ICD-10-CM | POA: Diagnosis not present

## 2015-11-25 ENCOUNTER — Ambulatory Visit: Payer: Self-pay | Admitting: Diagnostic Neuroimaging

## 2015-12-14 DIAGNOSIS — Z0000 Encounter for general adult medical examination without abnormal findings: Secondary | ICD-10-CM | POA: Diagnosis not present

## 2015-12-14 DIAGNOSIS — F338 Other recurrent depressive disorders: Secondary | ICD-10-CM | POA: Diagnosis not present

## 2015-12-14 DIAGNOSIS — Z86010 Personal history of colonic polyps: Secondary | ICD-10-CM | POA: Diagnosis not present

## 2015-12-14 DIAGNOSIS — I10 Essential (primary) hypertension: Secondary | ICD-10-CM | POA: Diagnosis not present

## 2015-12-14 DIAGNOSIS — N401 Enlarged prostate with lower urinary tract symptoms: Secondary | ICD-10-CM | POA: Diagnosis not present

## 2015-12-14 DIAGNOSIS — L570 Actinic keratosis: Secondary | ICD-10-CM | POA: Diagnosis not present

## 2015-12-14 DIAGNOSIS — E782 Mixed hyperlipidemia: Secondary | ICD-10-CM | POA: Diagnosis not present

## 2015-12-14 DIAGNOSIS — Z131 Encounter for screening for diabetes mellitus: Secondary | ICD-10-CM | POA: Diagnosis not present

## 2015-12-14 DIAGNOSIS — R8290 Unspecified abnormal findings in urine: Secondary | ICD-10-CM | POA: Diagnosis not present

## 2015-12-14 DIAGNOSIS — R972 Elevated prostate specific antigen [PSA]: Secondary | ICD-10-CM | POA: Diagnosis not present

## 2015-12-14 DIAGNOSIS — R7301 Impaired fasting glucose: Secondary | ICD-10-CM | POA: Diagnosis not present

## 2015-12-14 DIAGNOSIS — F909 Attention-deficit hyperactivity disorder, unspecified type: Secondary | ICD-10-CM | POA: Diagnosis not present

## 2015-12-14 DIAGNOSIS — Z87442 Personal history of urinary calculi: Secondary | ICD-10-CM | POA: Diagnosis not present

## 2015-12-15 ENCOUNTER — Ambulatory Visit: Payer: Self-pay | Admitting: Neurology

## 2015-12-28 DIAGNOSIS — L570 Actinic keratosis: Secondary | ICD-10-CM | POA: Diagnosis not present

## 2015-12-28 DIAGNOSIS — L853 Xerosis cutis: Secondary | ICD-10-CM | POA: Diagnosis not present

## 2015-12-28 DIAGNOSIS — D485 Neoplasm of uncertain behavior of skin: Secondary | ICD-10-CM | POA: Diagnosis not present

## 2015-12-28 DIAGNOSIS — L821 Other seborrheic keratosis: Secondary | ICD-10-CM | POA: Diagnosis not present

## 2015-12-28 DIAGNOSIS — L814 Other melanin hyperpigmentation: Secondary | ICD-10-CM | POA: Diagnosis not present

## 2015-12-28 DIAGNOSIS — D692 Other nonthrombocytopenic purpura: Secondary | ICD-10-CM | POA: Diagnosis not present

## 2015-12-28 DIAGNOSIS — C44519 Basal cell carcinoma of skin of other part of trunk: Secondary | ICD-10-CM | POA: Diagnosis not present

## 2015-12-29 ENCOUNTER — Ambulatory Visit: Payer: Self-pay | Admitting: Neurology

## 2016-01-04 ENCOUNTER — Telehealth: Payer: Self-pay | Admitting: Neurology

## 2016-01-04 ENCOUNTER — Telehealth: Payer: Self-pay | Admitting: Diagnostic Neuroimaging

## 2016-01-04 NOTE — Telephone Encounter (Signed)
appt scheduled for 01/27/16  [facility]

## 2016-01-04 NOTE — Telephone Encounter (Signed)
Per Preston Fleeting, new pt coordinator, patient can be rescheduled if he has not been dismissed from the practice by a provider.  New pt appt scheduled by phone staff with Dr Leta Baptist, 01/27/16.

## 2016-01-04 NOTE — Telephone Encounter (Signed)
error 

## 2016-01-04 NOTE — Telephone Encounter (Signed)
Pt called to r/s appt. He was advised he c/a 2 appt's. He said 12/7 was c/a by the office as Dr Viona Gilmore was out of the office that day.  He said he's had some unforseen circumstances that have resolved. He would like to r/s with Dr Mamie Nick. Is this ok?

## 2016-01-10 DIAGNOSIS — C44519 Basal cell carcinoma of skin of other part of trunk: Secondary | ICD-10-CM | POA: Diagnosis not present

## 2016-01-27 ENCOUNTER — Encounter: Payer: Self-pay | Admitting: Diagnostic Neuroimaging

## 2016-01-27 ENCOUNTER — Ambulatory Visit (INDEPENDENT_AMBULATORY_CARE_PROVIDER_SITE_OTHER): Payer: Medicare Other | Admitting: Diagnostic Neuroimaging

## 2016-01-27 VITALS — BP 129/69 | HR 74 | Ht 67.5 in | Wt 184.0 lb

## 2016-01-27 DIAGNOSIS — R454 Irritability and anger: Secondary | ICD-10-CM

## 2016-01-27 DIAGNOSIS — R42 Dizziness and giddiness: Secondary | ICD-10-CM

## 2016-01-27 DIAGNOSIS — R292 Abnormal reflex: Secondary | ICD-10-CM

## 2016-01-27 DIAGNOSIS — R7309 Other abnormal glucose: Secondary | ICD-10-CM | POA: Diagnosis not present

## 2016-01-27 DIAGNOSIS — R269 Unspecified abnormalities of gait and mobility: Secondary | ICD-10-CM | POA: Diagnosis not present

## 2016-01-27 DIAGNOSIS — R4689 Other symptoms and signs involving appearance and behavior: Secondary | ICD-10-CM
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
## 2016-01-27 NOTE — Patient Instructions (Signed)
Thank you for coming to see [place] at [facility]. I hope we have been able to provide you high quality care today.  You may receive a patient satisfaction survey over the next few weeks. We would appreciate your feedback and comments so that we may continue to improve ourselves and the health of our patients.  - I will check MRI brain and cervical spine  - consider physical therapy   ~~~~~~~~~~~~~~~~~~~~~~~~~~~~~~~~~~~~~~~~~~~~~~~~~~~~~~~~~~~~~~~~~  DR. Brisa Auth'S GUIDE TO HAPPY AND HEALTHY LIVING These are some of my general health and wellness recommendations. Some of them may apply to you better than others. Please use common sense as you try these suggestions and feel free to ask me any questions.   ACTIVITY/FITNESS Mental, social, emotional and physical stimulation are very important for brain and body health. Try learning a new activity (arts, music, language, sports, games).  Keep moving your body to the best of your abilities. You can do this at home, inside or outside, the park, community center, gym or anywhere you like. Consider a physical therapist or personal trainer to get started. Consider the app Sworkit. Fitness trackers such as smart-watches, smart-phones or Fitbits can help as well.   NUTRITION Eat more plants: colorful vegetables, nuts, seeds and berries.  Eat less sugar, salt, preservatives and processed foods.  Avoid toxins such as cigarettes and alcohol.  Drink water when you are thirsty. Warm water with a slice of lemon is an excellent morning drink to start the day.  Consider these websites for more information The Nutrition Source (https://www.henry-hernandez.biz/) Precision Nutrition (WindowBlog.ch)   RELAXATION Consider practicing mindfulness meditation or other relaxation techniques such as deep breathing, prayer, yoga, tai chi, massage. See website mindful.org or the apps Headspace or Calm to  help get started.   SLEEP Try to get at least 7-8+ hours sleep per day. Regular exercise and reduced caffeine will help you sleep better. Practice good sleep hygeine techniques. See website sleep.org for more information.   PLANNING Prepare estate planning, living will, healthcare POA documents. Sometimes this is best planned with the help of an attorney. Theconversationproject.org and agingwithdignity.org are excellent resources.

## 2016-01-28 LAB — VITAMIN B12: VITAMIN B 12: 475 pg/mL (ref 232–1245)

## 2016-01-28 LAB — HEMOGLOBIN A1C
ESTIMATED AVERAGE GLUCOSE: 88 mg/dL
HEMOGLOBIN A1C: 4.7 % — AB (ref 4.8–5.6)

## 2016-01-31 ENCOUNTER — Ambulatory Visit
Admission: RE | Admit: 2016-01-31 | Discharge: 2016-01-31 | Disposition: A | Discharge status: Home / Self Care | Payer: Medicare Other | Source: Ambulatory Visit | Attending: Diagnostic Neuroimaging | Admitting: Diagnostic Neuroimaging

## 2016-01-31 ENCOUNTER — Telehealth: Payer: Self-pay | Admitting: *Deleted

## 2016-01-31 DIAGNOSIS — R42 Dizziness and giddiness: Secondary | ICD-10-CM

## 2016-01-31 DIAGNOSIS — R4689 Other symptoms and signs involving appearance and behavior: Secondary | ICD-10-CM

## 2016-01-31 DIAGNOSIS — M4802 Spinal stenosis, cervical region: Secondary | ICD-10-CM | POA: Diagnosis not present

## 2016-01-31 DIAGNOSIS — R454 Irritability and anger: Secondary | ICD-10-CM

## 2016-01-31 DIAGNOSIS — R269 Unspecified abnormalities of gait and mobility: Secondary | ICD-10-CM

## 2016-01-31 DIAGNOSIS — R292 Abnormal reflex: Secondary | ICD-10-CM

## 2016-01-31 DIAGNOSIS — R2689 Other abnormalities of gait and mobility: Secondary | ICD-10-CM | POA: Diagnosis not present

## 2016-01-31 NOTE — Telephone Encounter (Signed)
Spoke with patient and informed him his lab results are unremarkable. He had MRIs today; advised him he will get a call after images are read, in several days. He verbalized understanding, appreciation.

## 2016-02-04 ENCOUNTER — Other Ambulatory Visit: Payer: Medicare Other

## 2016-02-06 ENCOUNTER — Other Ambulatory Visit: Payer: Medicare Other

## 2016-02-08 ENCOUNTER — Telehealth: Payer: Self-pay | Admitting: Diagnostic Neuroimaging

## 2016-02-08 NOTE — Telephone Encounter (Signed)
Patient requesting MRI results

## 2016-02-09 ENCOUNTER — Telehealth: Payer: Self-pay | Admitting: *Deleted

## 2016-02-09 NOTE — Telephone Encounter (Signed)
Per Dr Leta Baptist, spoke with patient and informed him his MRI brain results show some mild spots of scar tissue, but no major findings. 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 Dr Leta Baptist recommends conservative management for now such as physical therapy and pain management. Advised him that he could consider spine surgeon evaluation in the future if needed. Advised him Dr Leta Baptist will continue with his current treatment plan. Reminded him of his FU in April and advised he call for any concerns prior to that date. He verbalized understanding, appreciation of call.

## 2016-04-02 DIAGNOSIS — F322 Major depressive disorder, single episode, severe without psychotic features: Secondary | ICD-10-CM | POA: Diagnosis not present

## 2016-04-09 DIAGNOSIS — H25043 Posterior subcapsular polar age-related cataract, bilateral: Secondary | ICD-10-CM | POA: Diagnosis not present

## 2016-05-07 ENCOUNTER — Ambulatory Visit: Payer: Medicare Other | Admitting: Diagnostic Neuroimaging

## 2016-07-07 DIAGNOSIS — Z882 Allergy status to sulfonamides status: Secondary | ICD-10-CM | POA: Diagnosis not present

## 2016-07-07 DIAGNOSIS — Z833 Family history of diabetes mellitus: Secondary | ICD-10-CM | POA: Diagnosis not present

## 2016-07-07 DIAGNOSIS — R0602 Shortness of breath: Secondary | ICD-10-CM | POA: Diagnosis not present

## 2016-07-07 DIAGNOSIS — Z8249 Family history of ischemic heart disease and other diseases of the circulatory system: Secondary | ICD-10-CM | POA: Diagnosis not present

## 2016-07-07 DIAGNOSIS — R0789 Other chest pain: Secondary | ICD-10-CM | POA: Diagnosis not present

## 2016-07-07 DIAGNOSIS — E119 Type 2 diabetes mellitus without complications: Secondary | ICD-10-CM | POA: Diagnosis not present

## 2016-07-07 DIAGNOSIS — R202 Paresthesia of skin: Secondary | ICD-10-CM | POA: Diagnosis not present

## 2016-07-07 DIAGNOSIS — Z79899 Other long term (current) drug therapy: Secondary | ICD-10-CM | POA: Diagnosis not present

## 2016-07-07 DIAGNOSIS — F329 Major depressive disorder, single episode, unspecified: Secondary | ICD-10-CM | POA: Diagnosis not present

## 2016-07-07 DIAGNOSIS — I517 Cardiomegaly: Secondary | ICD-10-CM | POA: Diagnosis not present

## 2016-07-07 DIAGNOSIS — I10 Essential (primary) hypertension: Secondary | ICD-10-CM | POA: Diagnosis not present

## 2016-07-07 DIAGNOSIS — R9431 Abnormal electrocardiogram [ECG] [EKG]: Secondary | ICD-10-CM | POA: Diagnosis not present

## 2016-07-07 DIAGNOSIS — F419 Anxiety disorder, unspecified: Secondary | ICD-10-CM | POA: Diagnosis not present

## 2016-07-07 DIAGNOSIS — I959 Hypotension, unspecified: Secondary | ICD-10-CM | POA: Diagnosis not present

## 2016-07-07 DIAGNOSIS — N400 Enlarged prostate without lower urinary tract symptoms: Secondary | ICD-10-CM | POA: Diagnosis not present

## 2016-07-25 DIAGNOSIS — F4323 Adjustment disorder with mixed anxiety and depressed mood: Secondary | ICD-10-CM | POA: Diagnosis not present

## 2016-07-26 DIAGNOSIS — Z6828 Body mass index (BMI) 28.0-28.9, adult: Secondary | ICD-10-CM | POA: Diagnosis not present

## 2016-07-26 DIAGNOSIS — E663 Overweight: Secondary | ICD-10-CM | POA: Diagnosis not present

## 2016-07-26 DIAGNOSIS — R0609 Other forms of dyspnea: Secondary | ICD-10-CM | POA: Diagnosis not present

## 2016-07-26 DIAGNOSIS — M5412 Radiculopathy, cervical region: Secondary | ICD-10-CM | POA: Diagnosis not present

## 2016-07-27 ENCOUNTER — Telehealth: Payer: Self-pay

## 2016-07-27 NOTE — Telephone Encounter (Signed)
SENT NOTES TO SCHEDULING 

## 2016-07-30 DIAGNOSIS — M1990 Unspecified osteoarthritis, unspecified site: Secondary | ICD-10-CM | POA: Insufficient documentation

## 2016-07-31 ENCOUNTER — Encounter (INDEPENDENT_AMBULATORY_CARE_PROVIDER_SITE_OTHER): Payer: Self-pay

## 2016-07-31 ENCOUNTER — Ambulatory Visit (INDEPENDENT_AMBULATORY_CARE_PROVIDER_SITE_OTHER): Payer: Medicare Other | Admitting: Cardiology

## 2016-07-31 ENCOUNTER — Encounter: Payer: Self-pay | Admitting: Cardiology

## 2016-07-31 VITALS — BP 138/76 | HR 66 | Ht 67.5 in | Wt 186.4 lb

## 2016-07-31 DIAGNOSIS — R0600 Dyspnea, unspecified: Secondary | ICD-10-CM | POA: Diagnosis not present

## 2016-07-31 DIAGNOSIS — R0609 Other forms of dyspnea: Secondary | ICD-10-CM

## 2016-07-31 DIAGNOSIS — I10 Essential (primary) hypertension: Secondary | ICD-10-CM

## 2016-07-31 DIAGNOSIS — E7800 Pure hypercholesterolemia, unspecified: Secondary | ICD-10-CM | POA: Diagnosis not present

## 2016-07-31 NOTE — Patient Instructions (Signed)
Medication Instructions:  Your physician recommends that you continue on your current medications as directed. Please refer to the Current Medication list given to you today.   Labwork: None  Testing/Procedures: Your physician has requested that you have an echocardiogram. Echocardiography is a painless test that uses sound waves to create images of your heart. It provides your doctor with information about the size and shape of your heart and how well your heart's chambers and valves are working. This procedure takes approximately one hour. There are no restrictions for this procedure.  Your physician has requested that you have en exercise stress myoview. For further information please visit [email or web address]. Please follow instruction sheet, as given.    Follow-Up: As needed.      If you need a refill on your cardiac medications before your next appointment, please call your pharmacy.

## 2016-07-31 NOTE — Progress Notes (Signed)
Cardiology Office Note:    Date:  07/31/2016   ID:  Colin Villa, DOB 03-03-1940, MRN [id number]  PCP:  Hulan Fess, MD  Cardiologist:  Candee Furbish, MD    Referring MD: Hulan Fess, MD     History of Present Illness:    Colin Villa is a 76 y.o. male with a hx of Hypertension, hyperlipidemia with mother who died of myocardial infarction and former smoker quit 1986 here for evaluation of dyspnea on exertion at the request of Dr. Hulan Fess.  Over the last several weeks he is noted more shortness of breath especially when performing exertional activity. This [place], dragging it and after he stood up he felt quite short of breath, no associated chest pain, no diaphoresis. Took longer to get breath back. Overnight stay, neg troponin.  When he was there he ended up going to the emergency room because of his concerns and he was told that he had a normal EKG. Over 30 years ago he had a stress test which was normal.  Tingling in left arm - nerve. Little dizzy this AM.   Paxil for depression. Dr. Clovis Pu.   Many years ago he smoked, started 72 and quit in 1986 after 2 pack-a-day history. He has a daughter who battles with schizophrenia. One son is in good health.  Past Medical History:  Diagnosis Date  . ADHD   . BPH (benign prostatic hyperplasia)   . Colonic polyp   . Depression   . Elevated fasting glucose   . Elevated PSA   . Essential hypertension, benign   . Hypertension   . Kidney stones   . Mixed dyslipidemia   . Osteoarthritis    Especially of the left kneee which end stage     Past Surgical History:  Procedure Laterality Date  . COLONOSCOPY    . CORONARY ANGIOGRAPHY  1998  . KNEE SURGERY  1959   medial collateral ligament  . LITHOTRIPSY  1997  . REPLACEMENT TOTAL KNEE Left 2010    Current Medications: Current Meds  Medication Sig  . AMLODIPINE BESYLATE PO Take by mouth.  . clonazePAM (KLONOPIN) 0.5  MG tablet Take 0.5 mg by mouth 2 (two) times daily.  Marland Kitchen CLONIDINE HCL PO Take by mouth.  . modafinil (PROVIGIL) 200 MG tablet Take 200 mg by mouth daily.  Marland Kitchen PARoxetine HCl (PAXIL PO) Take 1 tablet by mouth daily.   . rosuvastatin (CRESTOR) 5 MG tablet Take 5 mg by mouth daily.  . tamsulosin (FLOMAX) 0.4 MG CAPS capsule 0.4 mg daily.     Allergies:   Sulfa antibiotics   Social History   Social History  . Marital status: Married    Spouse name: [name]  . Number of children: 2  . Years of education: 13   Occupational History  .      retired   Social History Main Topics  . Smoking status: Former Smoker    Quit date: 01/27/1996  . Smokeless tobacco: Never Used  . Alcohol use Yes     Comment: 4 beers weekly  . Drug use: No  . Sexual activity: Not Asked   Other Topics Concern  . None   Social History Narrative  . None     Family History: The patient's family history includes Cancer in his brother; Diabetes in his brother; Heart failure in his father and mother; Melanoma in his mother; Mental illness in his mother. No  early heart disease ROS:   Please see the history of present illness.   Denies any fevers, chills, orthopnea, PND, syncope, bleeding  All other systems reviewed and are negative.  EKGs/Labs/Other Studies Reviewed:    The following studies were reviewed today: Prior office notes reviewed, LDL 55, TSH normal at 2.13, creatinine 0.83, sodium 140, potassium 4.4, ALT 17, hemoglobin 15.4, hemoglobin A1c 4.8.  EKG:  EKG is  ordered today.  The ekg ordered today demonstrates Sinus rhythm 66 with subtle J-point elevation personally viewed, 07/31/2016. EKG performed on 07/26/16 demonstrates sinus rhythm with subtle J-point elevation noted in the anterior precordial leads, likely repolarization abnormality. Personally viewed. Recent Labs: No results found for requested labs within last 8760 hours.  Recent Lipid Panel No results found for: CHOL, TRIG, HDL, CHOLHDL, VLDL,  LDLCALC, LDLDIRECT  Physical Exam:    VS:  BP 138/76   Pulse 66   Ht 5' 7.5" (1.715 m)   Wt 186 lb 6.4 oz (84.6 kg)   SpO2 96%   BMI 28.76 kg/m     Wt Readings from Last 3 Encounters:  07/31/16 186 lb 6.4 oz (84.6 kg)  01/27/16 184 lb (83.5 kg)  12/31/13 185 lb (83.9 kg)     GEN:  Well nourished, well developed in no acute distress HEENT: Normal NECK: No JVD; No carotid bruits LYMPHATICS: No lymphadenopathy CARDIAC: RRR, no murmurs, rubs, gallops RESPIRATORY:  Clear to auscultation without rales, wheezing or rhonchi  ABDOMEN: Soft, non-tender, non-distended MUSCULOSKELETAL:  No edema; No deformity  SKIN: Warm and dry NEUROLOGIC:  Alert and oriented x 3 PSYCHIATRIC:  Normal affect   ASSESSMENT:    1. Dyspnea on exertion   2. Essential hypertension   3. Pure hypercholesterolemia   4. Dyspnea, unspecified type    PLAN:    In order of problems listed above:  Dyspnea on exertion  - Former smoker, hypertension, well-controlled hyperlipidemia. His dyspnea on exertion may be an anginal equivalent. We will go ahead and check a nuclear stress test to ensure that he does not have any high risk ischemia. I will also check an echocardiogram to ensure proper structure of his heart. EKG shows subtle J-point elevation in the anterior precordial leads however this is likely normal variant with repolarization abnormality. Thankfully, troponins were normal apparently at outside hospital in [facility]. To complete the workup, we will proceed with testing as above.  - If cardiac workup is unremarkable, consider further evaluation for pulmonary condition, perhaps check PFTs if not already performed given his prior extensive tobacco history.  Former smoker  - Quit many years ago after extensive history as above.  Essential hypertension  - Very well controlled on medications reviewed as above. No changes.  Hyperlipidemia  - Crestor. Excellent LDL. 50s.  Medication Adjustments/Labs and  Tests Ordered: Current medicines are reviewed at length with the patient today.  Concerns regarding medicines are outlined above.  Orders Placed This Encounter  Procedures  . Myocardial Perfusion Imaging  . EKG 12-Lead  . ECHOCARDIOGRAM COMPLETE   No orders of the defined types were placed in this encounter.   Signed, Candee Furbish, MD  07/31/2016 9:14 AM    [facility]

## 2016-08-01 DIAGNOSIS — D485 Neoplasm of uncertain behavior of skin: Secondary | ICD-10-CM | POA: Diagnosis not present

## 2016-08-01 DIAGNOSIS — C44729 Squamous cell carcinoma of skin of left lower limb, including hip: Secondary | ICD-10-CM | POA: Diagnosis not present

## 2016-08-02 ENCOUNTER — Telehealth (HOSPITAL_COMMUNITY): Payer: Self-pay | Admitting: *Deleted

## 2016-08-02 NOTE — Telephone Encounter (Signed)
Patient given detailed instructions per Myocardial Perfusion Study Information Sheet for the test on 08/07/16. Patient notified to arrive 15 minutes early and that it is imperative to arrive on time for appointment to keep from having the test rescheduled.  If you need to cancel or reschedule your appointment, please call the office within 24 hours of your appointment. . Patient verbalized understanding. Kirstie Peri

## 2016-08-07 ENCOUNTER — Other Ambulatory Visit: Payer: Self-pay

## 2016-08-07 ENCOUNTER — Ambulatory Visit (HOSPITAL_COMMUNITY): Payer: Medicare Other | Attending: Cardiology

## 2016-08-07 ENCOUNTER — Ambulatory Visit (HOSPITAL_BASED_OUTPATIENT_CLINIC_OR_DEPARTMENT_OTHER): Payer: Medicare Other

## 2016-08-07 DIAGNOSIS — R0609 Other forms of dyspnea: Secondary | ICD-10-CM | POA: Insufficient documentation

## 2016-08-07 DIAGNOSIS — E7800 Pure hypercholesterolemia, unspecified: Secondary | ICD-10-CM | POA: Insufficient documentation

## 2016-08-07 DIAGNOSIS — I351 Nonrheumatic aortic (valve) insufficiency: Secondary | ICD-10-CM | POA: Insufficient documentation

## 2016-08-07 DIAGNOSIS — R0600 Dyspnea, unspecified: Secondary | ICD-10-CM | POA: Diagnosis not present

## 2016-08-07 DIAGNOSIS — I10 Essential (primary) hypertension: Secondary | ICD-10-CM | POA: Insufficient documentation

## 2016-08-07 DIAGNOSIS — I253 Aneurysm of heart: Secondary | ICD-10-CM | POA: Insufficient documentation

## 2016-08-07 LAB — ECHOCARDIOGRAM COMPLETE
Height: 67.5 in
Weight: 2976 oz

## 2016-08-07 LAB — MYOCARDIAL PERFUSION IMAGING
CHL CUP MPHR: 146 {beats}/min
Estimated workload: 10 METS
Exercise duration (min): 8 min
LHR: 0.33
LV sys vol: 35 mL
LVDIAVOL: 109 mL (ref 62–150)
NUC STRESS TID: 1.03
Peak HR: 134 {beats}/min
Percent HR: 91 %
RPE: 15
Rest HR: 58 {beats}/min
SDS: 0
SRS: 2
SSS: 2

## 2016-08-07 MED ORDER — TECHNETIUM TC 99M TETROFOSMIN IV KIT
32.5000 | PACK | Freq: Once | INTRAVENOUS | Status: AC | PRN
  Administered 2016-08-07: 32.5 via INTRAVENOUS
  Filled 2016-08-07: qty 33

## 2016-08-07 MED ORDER — TECHNETIUM TC 99M TETROFOSMIN IV KIT
11.0000 | PACK | Freq: Once | INTRAVENOUS | Status: AC | PRN
  Administered 2016-08-07: 11 via INTRAVENOUS
  Filled 2016-08-07: qty 11

## 2016-08-08 ENCOUNTER — Encounter: Payer: Self-pay | Admitting: *Deleted

## 2016-08-08 ENCOUNTER — Telehealth: Payer: Self-pay | Admitting: *Deleted

## 2016-08-08 DIAGNOSIS — Z01812 Encounter for preprocedural laboratory examination: Secondary | ICD-10-CM

## 2016-08-08 DIAGNOSIS — F4323 Adjustment disorder with mixed anxiety and depressed mood: Secondary | ICD-10-CM | POA: Diagnosis not present

## 2016-08-08 NOTE — Telephone Encounter (Signed)
F/u message ° °Pt returning Rn call .please call back to discuss  °

## 2016-08-08 NOTE — Telephone Encounter (Signed)
-----   Message from Jerline Pain, MD sent at 08/07/2016  6:00 PM EDT ----- Even though perfusion portion of nuclear study did not show any imaging defects, his stress test was markedly abnormal with diffuse ST segment depression concerning for severe coronary artery disease. Given his symptoms of worsening shortness of breath with activity, I would like for him to get set up for a cardiac catheterization. I tried to call with result but went straight to voicemail. Candee Furbish, MD'

## 2016-08-08 NOTE — Telephone Encounter (Signed)
Cath scheduled for 08-10-16 at 1:30 pt to be at short stay at 11:30  Labs will be done in am  Of 08-09-16 ./cy

## 2016-08-09 ENCOUNTER — Other Ambulatory Visit: Payer: Medicare Other

## 2016-08-09 ENCOUNTER — Telehealth: Payer: Self-pay

## 2016-08-09 NOTE — Telephone Encounter (Signed)
Patient contacted pre-catheterization at [facility] scheduled for:  08/10/2016 @ 1330 Verified arrival time and place:  NT @ 1130 Confirmed AM meds to be taken pre-cath with sip of water:  Notified Pt to take a ASA 81 mg prior to arrival Confirmed patient has responsible person to drive home post procedure and observe patient for 24 hours:  yes Addl concerns:   None noted

## 2016-08-10 ENCOUNTER — Encounter (HOSPITAL_COMMUNITY)
Admission: RE | Disposition: A | Discharge status: Home / Self Care | Payer: Self-pay | Source: Ambulatory Visit | Attending: Cardiovascular Disease

## 2016-08-10 ENCOUNTER — Ambulatory Visit (HOSPITAL_COMMUNITY)
Admission: RE | Admit: 2016-08-10 | Discharge: 2016-08-10 | Disposition: A | Discharge status: Home / Self Care | Payer: Medicare Other | Source: Ambulatory Visit | Attending: Cardiovascular Disease | Admitting: Cardiovascular Disease

## 2016-08-10 DIAGNOSIS — R0609 Other forms of dyspnea: Secondary | ICD-10-CM | POA: Diagnosis not present

## 2016-08-10 DIAGNOSIS — R9439 Abnormal result of other cardiovascular function study: Secondary | ICD-10-CM

## 2016-08-10 DIAGNOSIS — I2510 Atherosclerotic heart disease of native coronary artery without angina pectoris: Secondary | ICD-10-CM | POA: Insufficient documentation

## 2016-08-10 HISTORY — PX: THORACIC AORTOGRAM: CATH118269

## 2016-08-10 HISTORY — PX: LEFT HEART CATH AND CORONARY ANGIOGRAPHY: CATH118249

## 2016-08-10 LAB — CBC
HEMATOCRIT: 39.2 % (ref 39.0–52.0)
HEMOGLOBIN: 14.3 g/dL (ref 13.0–17.0)
MCH: 33.2 pg (ref 26.0–34.0)
MCHC: 36.5 g/dL — AB (ref 30.0–36.0)
MCV: 91 fL (ref 78.0–100.0)
Platelets: 114 10*3/uL — ABNORMAL LOW (ref 150–400)
RBC: 4.31 MIL/uL (ref 4.22–5.81)
RDW: 13.4 % (ref 11.5–15.5)
WBC: 6.3 10*3/uL (ref 4.0–10.5)

## 2016-08-10 LAB — PROTIME-INR
INR: 1.05
Prothrombin Time: 13.7 seconds (ref 11.4–15.2)

## 2016-08-10 LAB — BASIC METABOLIC PANEL
ANION GAP: 7 (ref 5–15)
BUN: 9 mg/dL (ref 6–20)
CHLORIDE: 106 mmol/L (ref 101–111)
CO2: 24 mmol/L (ref 22–32)
CREATININE: 0.9 mg/dL (ref 0.61–1.24)
Calcium: 9.1 mg/dL (ref 8.9–10.3)
GFR calc non Af Amer: >60 mL/min (ref >60)
Glucose, Bld: 138 mg/dL — ABNORMAL HIGH (ref 65–99)
Potassium: 4.1 mmol/L (ref 3.5–5.1)
Sodium: 137 mmol/L (ref 135–145)

## 2016-08-10 SURGERY — LEFT HEART CATH AND CORONARY ANGIOGRAPHY
Anesthesia: LOCAL

## 2016-08-10 MED ORDER — ROSUVASTATIN CALCIUM 40 MG PO TABS: 40.0000 mg | ORAL_TABLET | Freq: Every day | ORAL | Status: DC

## 2016-08-10 MED ORDER — IOPAMIDOL (ISOVUE-370) INJECTION 76%
INTRAVENOUS | Status: DC | PRN
  Administered 2016-08-10: 100 mL via INTRA_ARTERIAL

## 2016-08-10 MED ORDER — IOPAMIDOL (ISOVUE-370) INJECTION 76%
INTRAVENOUS | Status: AC
  Filled 2016-08-10: qty 100

## 2016-08-10 MED ORDER — FENTANYL CITRATE (PF) 100 MCG/2ML IJ SOLN
INTRAMUSCULAR | Status: DC | PRN
  Administered 2016-08-10: 25 ug via INTRAVENOUS

## 2016-08-10 MED ORDER — LIDOCAINE HCL (PF) 1 % IJ SOLN
INTRAMUSCULAR | Status: AC
  Filled 2016-08-10: qty 30

## 2016-08-10 MED ORDER — SODIUM CHLORIDE 0.9% FLUSH: 3.0000 mL | INTRAVENOUS | Status: DC | PRN

## 2016-08-10 MED ORDER — ASPIRIN 81 MG PO CHEW
CHEWABLE_TABLET | ORAL | Status: AC
  Filled 2016-08-10: qty 1

## 2016-08-10 MED ORDER — MIDAZOLAM HCL 2 MG/2ML IJ SOLN
INTRAMUSCULAR | Status: AC
  Filled 2016-08-10: qty 2

## 2016-08-10 MED ORDER — SODIUM CHLORIDE 0.9% FLUSH
3.0000 mL | INTRAVENOUS | Status: DC | PRN
Start: 2016-08-10 — End: 2016-08-12

## 2016-08-10 MED ORDER — SODIUM CHLORIDE 0.9 % WEIGHT BASED INFUSION: 1.0000 mL/kg/h | INTRAVENOUS | Status: DC

## 2016-08-10 MED ORDER — CARVEDILOL 3.125 MG PO TABS: 3.1250 mg | ORAL_TABLET | Freq: Two times a day (BID) | ORAL | Status: DC

## 2016-08-10 MED ORDER — ONDANSETRON HCL 4 MG/2ML IJ SOLN: 4.0000 mg | Freq: Four times a day (QID) | INTRAMUSCULAR | Status: DC | PRN

## 2016-08-10 MED ORDER — SODIUM CHLORIDE 0.9 % IV SOLN: 250.0000 mL | INTRAVENOUS | Status: DC | PRN

## 2016-08-10 MED ORDER — SODIUM CHLORIDE 0.9 % IV SOLN: INTRAVENOUS | Status: AC

## 2016-08-10 MED ORDER — ASPIRIN 81 MG PO CHEW: 81.0000 mg | CHEWABLE_TABLET | Freq: Every day | ORAL | Status: DC

## 2016-08-10 MED ORDER — SODIUM CHLORIDE 0.9 % WEIGHT BASED INFUSION
3.0000 mL/kg/h | INTRAVENOUS | Status: AC
  Administered 2016-08-10: 3 mL/kg/h via INTRAVENOUS

## 2016-08-10 MED ORDER — SODIUM CHLORIDE 0.9% FLUSH: 3.0000 mL | Freq: Two times a day (BID) | INTRAVENOUS | Status: DC

## 2016-08-10 MED ORDER — HEPARIN (PORCINE) IN NACL 2-0.9 UNIT/ML-% IJ SOLN
INTRAMUSCULAR | Status: AC | PRN
  Administered 2016-08-10: 1000 mL

## 2016-08-10 MED ORDER — LIDOCAINE HCL (PF) 1 % IJ SOLN
INTRAMUSCULAR | Status: DC | PRN
  Administered 2016-08-10: 15 mL
  Administered 2016-08-10: 2 mL

## 2016-08-10 MED ORDER — HEPARIN SODIUM (PORCINE) 1000 UNIT/ML IJ SOLN
INTRAMUSCULAR | Status: AC
  Filled 2016-08-10: qty 1

## 2016-08-10 MED ORDER — VERAPAMIL HCL 2.5 MG/ML IV SOLN
INTRAVENOUS | Status: AC
  Filled 2016-08-10: qty 2

## 2016-08-10 MED ORDER — FENTANYL CITRATE (PF) 100 MCG/2ML IJ SOLN
INTRAMUSCULAR | Status: AC
  Filled 2016-08-10: qty 2

## 2016-08-10 MED ORDER — MIDAZOLAM HCL 2 MG/2ML IJ SOLN
INTRAMUSCULAR | Status: DC | PRN
  Administered 2016-08-10: 2 mg via INTRAVENOUS

## 2016-08-10 MED ORDER — ACETAMINOPHEN 325 MG PO TABS: 650.0000 mg | ORAL_TABLET | ORAL | Status: DC | PRN

## 2016-08-10 MED ORDER — ASPIRIN 81 MG PO CHEW
81.0000 mg | CHEWABLE_TABLET | ORAL | Status: AC
  Administered 2016-08-10: 81 mg via ORAL

## 2016-08-10 MED ORDER — HEPARIN (PORCINE) IN NACL 2-0.9 UNIT/ML-% IJ SOLN
INTRAMUSCULAR | Status: AC
  Filled 2016-08-10: qty 1000

## 2016-08-10 SURGICAL SUPPLY — 11 items
CATH INFINITI 5FR MULTPACK ANG (CATHETERS) ×2 IMPLANT
GLIDESHEATH SLEND SS 6F .021 (SHEATH) ×2 IMPLANT
GUIDEWIRE INQWIRE 1.5J.035X260 (WIRE) ×1 IMPLANT
INQWIRE 1.5J .035X260CM (WIRE) ×2
KIT HEART LEFT (KITS) ×2 IMPLANT
PACK CARDIAC CATHETERIZATION (CUSTOM PROCEDURE TRAY) ×2 IMPLANT
SHEATH PINNACLE 5F 10CM (SHEATH) ×2 IMPLANT
SYR MEDRAD MARK V 150ML (SYRINGE) ×2 IMPLANT
TRANSDUCER W/STOPCOCK (MISCELLANEOUS) ×2 IMPLANT
TUBING CIL FLEX 10 FLL-RA (TUBING) ×2 IMPLANT
WIRE EMERALD 3MM-J .035X150CM (WIRE) ×2 IMPLANT

## 2016-08-10 NOTE — Interval H&P Note (Signed)
Cath Lab Visit (complete for each Cath Lab visit)  Clinical Evaluation Leading to the Procedure:   ACS: No.  Non-ACS:    Anginal Classification: CCS II  Anti-ischemic medical therapy: Minimal Therapy (1 class of medications)  Non-Invasive Test Results: Low-risk stress test findings: cardiac mortality <1%/year  Prior CABG: No previous CABG      History and Physical Interval Note:  08/10/2016 2:58 PM  [name]  has presented today for surgery, with the diagnosis of abn myoview   sob  The various methods of treatment have been discussed with the patient and family. After consideration of risks, benefits and other options for treatment, the patient has consented to  Procedure(s): LEFT HEART CATH AND CORONARY ANGIOGRAPHY (N/A) as a surgical intervention .  The patient's history has been reviewed, patient examined, no change in status, stable for surgery.  I have reviewed the patient's chart and labs.  Questions were answered to the patient's satisfaction.     Colin Villa

## 2016-08-10 NOTE — Progress Notes (Signed)
71fr sheath aspirated and removed from rfa, manual pressure applied for 20 minutes. Groin level 0 right dp and pt easily palpable. Left dp and pt weaker but palpable.  Tegaderm dressing applied, bedrest instructions given.   Bedrest begins at 17:05:00

## 2016-08-10 NOTE — Progress Notes (Addendum)
Pt states that he has been throwing up his morning pills this past week.  States that he is not nauseated, but shortly after he takes his medicines he "feels like they are stuck in his esophogus or something" and then he throws them up 15-20 minutes after taking them.  This has happened 3 or 4 times this past week including this morning. I spoke with Rennis Harding RN from cath lab and we felt he should have another aspirin in case he did not get a full dose in.  Pt plans on talking with his MD about what might be the cause of this.

## 2016-08-10 NOTE — Discharge Instructions (Signed)

## 2016-08-10 NOTE — H&P (View-Only) (Signed)
Pt states that he has been throwing up his morning pills this past week.  States that he is not nauseated, but shortly after he takes his medicines he "feels like they are stuck in his esophogus or something" and then he throws them up 15-20 minutes after taking them.  This has happened 3 or 4 times this past week including this morning. I spoke with Rennis Harding RN from cath lab and we felt he should have another aspirin in case he did not get a full dose in.  Pt plans on talking with his MD about what might be the cause of this.

## 2016-08-11 MED ORDER — ROSUVASTATIN CALCIUM 40 MG PO TABS: 40.0000 mg | ORAL_TABLET | ORAL | 5 refills | Status: DC

## 2016-08-11 MED ORDER — CARVEDILOL 3.125 MG PO TABS: 3.1250 mg | ORAL_TABLET | Freq: Two times a day (BID) | ORAL | 5 refills | Status: DC

## 2016-08-13 ENCOUNTER — Encounter (HOSPITAL_COMMUNITY): Payer: Self-pay | Admitting: Cardiovascular Disease

## 2016-08-13 ENCOUNTER — Telehealth: Payer: Self-pay

## 2016-08-13 NOTE — Telephone Encounter (Signed)
Call placed to Pt to notify of post cath f/u appt.  Left detailed message per DPR.   Scheduled appt with [name] 09/05/2016 at 1:45 pm at [facility] office.   This was first available APP appt @ [facility] office.  Per [employer] nurse, Pt to f/u 2-3 weeks post cath. Left office # if Pt has any questions or needs to reschedule.

## 2016-08-15 ENCOUNTER — Telehealth: Payer: Self-pay | Admitting: *Deleted

## 2016-08-15 DIAGNOSIS — C44729 Squamous cell carcinoma of skin of left lower limb, including hip: Secondary | ICD-10-CM | POA: Diagnosis not present

## 2016-08-15 DIAGNOSIS — L821 Other seborrheic keratosis: Secondary | ICD-10-CM | POA: Diagnosis not present

## 2016-08-15 NOTE — Telephone Encounter (Signed)
-----   Message from Consuelo Pandy, [place] sent at 08/12/2016  3:55 PM EDT ----- Regarding: contact pt re med changes Geronimo Boot,  I was on call this weekend and was notified by a hospital pharmacist that this patient was released from short stay without discharge instruction or new prescriptions. He was still in the hospital pharmacist's work que and he caught it. Pt came in for an outpatient cath with Dr. Claiborne Billings. He has CAD but nonobstructive. Dr. Claiborne Billings recommended increasing his Crestor to 40 mg and also recommended that we add Coreg, 3.25 mg BID. I sent Rx to his outpatient pharmacy electronically and tried contacting the patient regarding Dr. Evette Georges recs but could not reach him. Can you please f/u and contact pt. Rx should be at his pharmacy. I'm not sure how this got mixed up with short stay on Friday.  Thanks

## 2016-08-15 NOTE — Telephone Encounter (Signed)
Patient returned a call informing me that he did pick up the prescriptions from the pharmacy.

## 2016-08-15 NOTE — Telephone Encounter (Signed)
Left message that I am following up on his discharge to making sure he has picked up his prescriptions ordered at discharge. If not please contact the office. Otherwise no need to return a call.

## 2016-08-16 ENCOUNTER — Telehealth: Payer: Self-pay | Admitting: Cardiovascular Disease

## 2016-08-16 NOTE — Telephone Encounter (Signed)
New message    Pt called yesterday during the time when Epic was down. He was returning call to [place].

## 2016-08-16 NOTE — Telephone Encounter (Signed)
Left message to call back if needed-- from message yesterday - making sure medication was picked up .   will forward in case another matter is needed to be addresss

## 2016-08-16 NOTE — Telephone Encounter (Signed)
Spoke with pt he states that they told him no heavy lifting  For 5 days he states that he was just calling to make sure. I informed pt that anything hurts or does not "feel right" stop and continue the next day or when he feels better and to call if needed. Pt verbalizes understanding.

## 2016-08-16 NOTE — Telephone Encounter (Signed)
Colin Villa is calling to see if he can spread some gravel down . Wants to know if that will be too much activity after his cath . Does he needs to wait a little while longer . Please call

## 2016-08-22 DIAGNOSIS — F4323 Adjustment disorder with mixed anxiety and depressed mood: Secondary | ICD-10-CM | POA: Diagnosis not present

## 2016-09-05 ENCOUNTER — Ambulatory Visit (INDEPENDENT_AMBULATORY_CARE_PROVIDER_SITE_OTHER): Payer: Medicare Other | Admitting: Physician Assistant

## 2016-09-05 ENCOUNTER — Encounter: Payer: Self-pay | Admitting: Physician Assistant

## 2016-09-05 VITALS — BP 136/54 | HR 70 | Ht 68.0 in | Wt 189.4 lb

## 2016-09-05 DIAGNOSIS — I2510 Atherosclerotic heart disease of native coronary artery without angina pectoris: Secondary | ICD-10-CM | POA: Insufficient documentation

## 2016-09-05 DIAGNOSIS — R0609 Other forms of dyspnea: Secondary | ICD-10-CM | POA: Diagnosis not present

## 2016-09-05 DIAGNOSIS — E785 Hyperlipidemia, unspecified: Secondary | ICD-10-CM | POA: Diagnosis not present

## 2016-09-05 DIAGNOSIS — I10 Essential (primary) hypertension: Secondary | ICD-10-CM | POA: Diagnosis not present

## 2016-09-05 NOTE — Patient Instructions (Addendum)
Medication Instructions:  1. Your physician recommends that you continue on your current medications as directed. Please refer to the Current Medication list given to you today.   Labwork: FASTING LIPID AND LIVER PANEL TO BE DONE ON 12/06/16, COME IN ANYTIME FIRST THING IN THE MORNING FASTING , NOTHING TO EAT OR DRINK AFTER MIDNIGHT THE NIGHT BEFORE LAB WORK   Testing/Procedures: NONE ORDERED   Follow-Up: Your physician wants you to follow-up in: 6 MONTHS WITH DR. Marlou Porch You will receive a reminder letter in the mail two months in advance. If you don't receive a letter, please call our office to schedule the follow-up appointment.   Any Other Special Instructions Will Be Listed Below (If Applicable).     If you need a refill on your cardiac medications before your next appointment, please call your pharmacy.

## 2016-09-05 NOTE — Progress Notes (Signed)
Cardiology Office Note:    Date:  09/05/2016   ID:  Colin Villa, DOB 1940-07-29, MRN [id number]  PCP:  Colin Fess, MD  Cardiologist:  Dr. Candee Furbish    Referring MD: Colin Fess, MD   Chief Complaint  Patient presents with  . Follow-up    s/p cardiac cath    History of Present Illness:    Colin Villa is a 76 y.o. male with a hx of HTN, HL, FHx of CAD, former smoker.  He was evaluated by Dr. Candee Furbish last month for dyspnea on exertion.  Echo was obtained and demonstrated EF 55-60 with mild diastolic dysfunction.  A Nuclear stress test demonstrated normal perfusion but he had 2 mm inf-lat ST depression. There was a concern for balanced ischemia.  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  Med Rx was recommended.    [name] returns for follow up.  He is here with his wife.  He has been doing well since the procedure.  He denies any chest pain.  He only notices dyspnea with more extreme activities.  He denies wheezing or cough. He denies orthopnea, paroxysmal nocturnal dyspnea, edema, syncope.    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 ADHD   . BPH (benign prostatic hyperplasia)   . Colonic polyp   . Depression   . Elevated fasting glucose   . Elevated PSA   . Essential hypertension, benign   . Hypertension   . Kidney stones   . Mixed dyslipidemia   . Osteoarthritis    Especially of  the left kneee which end stage     Past Surgical History:  Procedure Laterality Date  . COLONOSCOPY    . CORONARY ANGIOGRAPHY  1998  . KNEE SURGERY  1959   medial collateral ligament  . LEFT HEART CATH AND CORONARY ANGIOGRAPHY N/A 08/10/2016   Procedure: LEFT HEART CATH AND CORONARY ANGIOGRAPHY;  Surgeon: Troy Sine, MD;  Location: [facility];  Service: Cardiovascular;  Laterality: N/A;  . LITHOTRIPSY  1997  . REPLACEMENT TOTAL KNEE Left 2010  . THORACIC AORTOGRAM N/A 08/10/2016   Procedure: Thoracic Aortogram;  Surgeon: Troy Sine, MD;  Location: [facility];  Service: Cardiovascular;  Laterality: N/A;    Current Medications: Current Meds  Medication Sig  . amLODipine (NORVASC) 5 MG tablet Take 5 mg by mouth daily.   Marland Kitchen aspirin 81 MG tablet Take 81 mg by mouth daily.  . carvedilol (COREG) 3.125 MG tablet Take 1 tablet (3.125 mg total) by mouth 2 (two) times daily with a meal.  . clonazePAM (KLONOPIN) 0.5 MG tablet Take 0.5 mg by mouth 2 (two) times daily.  . cloNIDine (CATAPRES) 0.1 MG tablet Take 0.1 mg by mouth 2 (two) times daily.   . modafinil (PROVIGIL) 200 MG tablet Take 300 mg by mouth daily.   Marland Kitchen PARoxetine (PAXIL) 30 MG tablet Take 2 tablets by mouth  daily.   . rosuvastatin (CRESTOR) 40 MG tablet Take 1 tablet (40 mg total) by mouth every morning.  . tamsulosin (FLOMAX) 0.4 MG CAPS capsule Take 0.4 mg by mouth daily.      Allergies:   Sulfa antibiotics   Social History   Social History  . Marital status: Married    Spouse name: [name]  . Number of children: 2  . Years of education: 13   Occupational History  .      retired   Social History Main Topics  . Smoking status: Former Smoker    Quit date: 01/27/1996  . Smokeless tobacco: Never Used  . Alcohol use Yes     Comment: 4 beers weekly  . Drug use: No  . Sexual activity: Not Asked   Other Topics Concern  . None   Social History Narrative  . None     Family Hx: The patient's  family history includes Cancer in his brother; Diabetes in his brother; Heart failure in his father and mother; Melanoma in his mother; Mental illness in his mother.  ROS:   Please see the history of present illness.    Review of Systems  Cardiovascular: Positive for dyspnea on exertion.  Neurological: Positive for dizziness.  Psychiatric/Behavioral: Positive for depression. The patient is nervous/anxious.    All other systems reviewed and are negative.   EKGs/Labs/Other Test Reviewed:    EKG:  EKG is  ordered today.  The ekg ordered today demonstrates NSR, HR 70, normal axis, QTc 412 ms  Recent Labs: 08/10/2016: BUN 9; Creatinine, Ser 0.90; Hemoglobin 14.3; Platelets 114; Potassium 4.1; Sodium 137   Recent Lipid Panel No results found for: CHOL, TRIG, HDL, CHOLHDL, LDLCALC, LDLDIRECT  Physical Exam:    VS:  BP (!) 136/54   Pulse 70   Ht _0  (1.727 m)   Wt 189 lb 6.4 oz (85.9 kg)   BMI 28.80 kg/m     Wt Readings from Last 3 Encounters:  09/05/16 189 lb 6.4 oz (85.9 kg)  08/10/16 185 lb (83.9 kg)  08/07/16 186 lb (84.4 kg)     Physical Exam  Constitutional: He is oriented to person, place, and time. He appears well-developed and well-nourished. No distress.  HENT:  Head: Normocephalic and atraumatic.  Eyes: No scleral icterus.  Neck: No JVD present.  Cardiovascular: Normal rate and regular rhythm.   No murmur heard. Pulmonary/Chest: Effort normal. He has no rales.  Abdominal: Soft. There is no tenderness.  Musculoskeletal: He exhibits no edema or deformity (R wrist and R FA site without hematoma; no R FA bruit noted).  Neurological: He is alert and oriented to person, place, and time.  Skin: Skin is warm and dry.  Psychiatric: He has a normal mood and affect.    ASSESSMENT:    1. Coronary artery disease involving native coronary artery of native heart without angina pectoris   2. Exertional dyspnea   3. Essential hypertension   4. Hyperlipidemia, unspecified  hyperlipidemia type    PLAN:    In order of problems listed above:  1. Coronary artery disease involving native coronary artery of native heart without angina pectoris  Mild to mod non-obs CAD noted on Cardiac Catheterization.  Med Rx recommended.  He denies angina. Continue ASA, statin, beta-blocker.  2. Exertional dyspnea This is overall mild.  He has a significant smoking hx.  However, he really does not have a lot of symptoms c/w COPD.  If his breathing worsens, he could  have PFTs with his PCP to investigate for COPD/emphysema.  3. Essential hypertension The patient's blood pressure is controlled on his current regimen.  Continue current therapy.    4. Hyperlipidemia, unspecified hyperlipidemia type  Continue higher dose statin.  Check Lipids and LFTs in 3 mos.   Dispo:  Return in about 6 months (around 03/07/2017) for Routine Follow Up w/ Dr. Marlou Porch.   Medication Adjustments/Labs and Tests Ordered: Current medicines are reviewed at length with the patient today.  Concerns regarding medicines are outlined above.  Tests Ordered: Orders Placed This Encounter  Procedures  . Lipid panel  . Hepatic function panel  . EKG 12-Lead   Medication Changes: No orders of the defined types were placed in this encounter.   Signed, [name], PA-C  09/05/2016 2:26 PM    [facility] [place], [place],   [address] Phone: [phone]; Fax: [phone]

## 2016-10-03 DIAGNOSIS — F4323 Adjustment disorder with mixed anxiety and depressed mood: Secondary | ICD-10-CM | POA: Diagnosis not present

## 2016-10-17 DIAGNOSIS — F322 Major depressive disorder, single episode, severe without psychotic features: Secondary | ICD-10-CM | POA: Diagnosis not present

## 2016-10-26 DIAGNOSIS — H2513 Age-related nuclear cataract, bilateral: Secondary | ICD-10-CM | POA: Diagnosis not present

## 2016-11-05 DIAGNOSIS — Z23 Encounter for immunization: Secondary | ICD-10-CM | POA: Diagnosis not present

## 2016-11-19 DIAGNOSIS — I10 Essential (primary) hypertension: Secondary | ICD-10-CM | POA: Diagnosis not present

## 2016-12-06 ENCOUNTER — Encounter (INDEPENDENT_AMBULATORY_CARE_PROVIDER_SITE_OTHER): Payer: Self-pay

## 2016-12-06 ENCOUNTER — Other Ambulatory Visit: Payer: Medicare Other | Admitting: *Deleted

## 2016-12-06 DIAGNOSIS — E785 Hyperlipidemia, unspecified: Secondary | ICD-10-CM | POA: Diagnosis not present

## 2016-12-06 DIAGNOSIS — I2510 Atherosclerotic heart disease of native coronary artery without angina pectoris: Secondary | ICD-10-CM | POA: Diagnosis not present

## 2016-12-06 LAB — LIPID PANEL
CHOLESTEROL TOTAL: 115 mg/dL (ref 100–199)
Chol/HDL Ratio: 3.3 ratio (ref 0.0–5.0)
HDL: 35 mg/dL — AB (ref >39)
LDL CALC: 50 mg/dL (ref 0–99)
TRIGLYCERIDES: 151 mg/dL — AB (ref 0–149)
VLDL Cholesterol Cal: 30 mg/dL (ref 5–40)

## 2016-12-06 LAB — HEPATIC FUNCTION PANEL
ALK PHOS: 77 IU/L (ref 39–117)
ALT: 21 IU/L (ref 0–44)
AST: 24 IU/L (ref 0–40)
Albumin: 4.4 g/dL (ref 3.5–4.8)
Bilirubin Total: 0.6 mg/dL (ref 0.0–1.2)
Bilirubin, Direct: 0.14 mg/dL (ref 0.00–0.40)
TOTAL PROTEIN: 6.6 g/dL (ref 6.0–8.5)

## 2016-12-07 ENCOUNTER — Telehealth: Payer: Self-pay | Admitting: *Deleted

## 2016-12-07 NOTE — Telephone Encounter (Signed)
Left message to go over lab results.  

## 2016-12-07 NOTE — Telephone Encounter (Signed)
-----   Message from Burtis Junes, NP sent at 12/07/2016  7:30 AM EST ----- Reviewed for [name], PA Ok to report. Labs are stable - lipids are great. LFTS normal. No change in current regimen.

## 2016-12-10 NOTE — Telephone Encounter (Signed)
Lmtcb x 2 to go over lab results 

## 2016-12-10 NOTE — Telephone Encounter (Signed)
Follow Up:; ° ° °Returning your call. °

## 2016-12-10 NOTE — Telephone Encounter (Signed)
-----   Message from Burtis Junes, NP sent at 12/07/2016  7:30 AM EST ----- Reviewed for [name], PA Ok to report. Labs are stable - lipids are great. LFTS normal. No change in current regimen.

## 2016-12-11 NOTE — Telephone Encounter (Signed)
-----   Message from Burtis Junes, NP sent at 12/07/2016  7:30 AM EST ----- Reviewed for [name], PA Ok to report. Labs are stable - lipids are great. LFTS normal. No change in current regimen.

## 2016-12-11 NOTE — Telephone Encounter (Signed)
Pt has been notified of lab results by phone with verbal understanding. Pt thanked me for my call today.   

## 2017-01-11 DIAGNOSIS — F338 Other recurrent depressive disorders: Secondary | ICD-10-CM | POA: Diagnosis not present

## 2017-01-11 DIAGNOSIS — E782 Mixed hyperlipidemia: Secondary | ICD-10-CM | POA: Diagnosis not present

## 2017-01-11 DIAGNOSIS — Z125 Encounter for screening for malignant neoplasm of prostate: Secondary | ICD-10-CM | POA: Diagnosis not present

## 2017-01-11 DIAGNOSIS — E663 Overweight: Secondary | ICD-10-CM | POA: Diagnosis not present

## 2017-01-11 DIAGNOSIS — R7301 Impaired fasting glucose: Secondary | ICD-10-CM | POA: Diagnosis not present

## 2017-01-11 DIAGNOSIS — Z87442 Personal history of urinary calculi: Secondary | ICD-10-CM | POA: Diagnosis not present

## 2017-01-11 DIAGNOSIS — N401 Enlarged prostate with lower urinary tract symptoms: Secondary | ICD-10-CM | POA: Diagnosis not present

## 2017-01-11 DIAGNOSIS — F909 Attention-deficit hyperactivity disorder, unspecified type: Secondary | ICD-10-CM | POA: Diagnosis not present

## 2017-01-11 DIAGNOSIS — R8290 Unspecified abnormal findings in urine: Secondary | ICD-10-CM | POA: Diagnosis not present

## 2017-01-11 DIAGNOSIS — Z0000 Encounter for general adult medical examination without abnormal findings: Secondary | ICD-10-CM | POA: Diagnosis not present

## 2017-01-11 DIAGNOSIS — Z86010 Personal history of colonic polyps: Secondary | ICD-10-CM | POA: Diagnosis not present

## 2017-01-11 DIAGNOSIS — I10 Essential (primary) hypertension: Secondary | ICD-10-CM | POA: Diagnosis not present

## 2017-01-15 DIAGNOSIS — H2513 Age-related nuclear cataract, bilateral: Secondary | ICD-10-CM | POA: Diagnosis not present

## 2017-01-15 DIAGNOSIS — H25013 Cortical age-related cataract, bilateral: Secondary | ICD-10-CM | POA: Diagnosis not present

## 2017-01-15 DIAGNOSIS — H2512 Age-related nuclear cataract, left eye: Secondary | ICD-10-CM | POA: Diagnosis not present

## 2017-01-15 DIAGNOSIS — H25043 Posterior subcapsular polar age-related cataract, bilateral: Secondary | ICD-10-CM | POA: Diagnosis not present

## 2017-01-15 DIAGNOSIS — H02839 Dermatochalasis of unspecified eye, unspecified eyelid: Secondary | ICD-10-CM | POA: Diagnosis not present

## 2017-02-06 ENCOUNTER — Other Ambulatory Visit: Payer: Self-pay | Admitting: Cardiology

## 2017-02-06 NOTE — Telephone Encounter (Signed)
REFILL 

## 2017-02-11 DIAGNOSIS — H2513 Age-related nuclear cataract, bilateral: Secondary | ICD-10-CM | POA: Diagnosis not present

## 2017-02-11 DIAGNOSIS — H2512 Age-related nuclear cataract, left eye: Secondary | ICD-10-CM | POA: Diagnosis not present

## 2017-02-12 DIAGNOSIS — H2511 Age-related nuclear cataract, right eye: Secondary | ICD-10-CM | POA: Diagnosis not present

## 2017-02-13 ENCOUNTER — Other Ambulatory Visit: Payer: Self-pay | Admitting: Internal Medicine

## 2017-03-04 DIAGNOSIS — H2513 Age-related nuclear cataract, bilateral: Secondary | ICD-10-CM | POA: Diagnosis not present

## 2017-03-04 DIAGNOSIS — H2511 Age-related nuclear cataract, right eye: Secondary | ICD-10-CM | POA: Diagnosis not present

## 2017-04-07 ENCOUNTER — Other Ambulatory Visit: Payer: Self-pay | Admitting: Cardiology

## 2017-04-08 ENCOUNTER — Other Ambulatory Visit: Payer: Self-pay | Admitting: Cardiology

## 2017-04-08 NOTE — Telephone Encounter (Signed)
REFILL 

## 2017-05-02 DIAGNOSIS — L821 Other seborrheic keratosis: Secondary | ICD-10-CM | POA: Diagnosis not present

## 2017-05-02 DIAGNOSIS — Z85828 Personal history of other malignant neoplasm of skin: Secondary | ICD-10-CM | POA: Diagnosis not present

## 2017-05-02 DIAGNOSIS — L820 Inflamed seborrheic keratosis: Secondary | ICD-10-CM | POA: Diagnosis not present

## 2017-05-02 DIAGNOSIS — L570 Actinic keratosis: Secondary | ICD-10-CM | POA: Diagnosis not present

## 2017-05-02 DIAGNOSIS — L738 Other specified follicular disorders: Secondary | ICD-10-CM | POA: Diagnosis not present

## 2017-05-03 ENCOUNTER — Other Ambulatory Visit: Payer: Self-pay

## 2017-05-03 MED ORDER — ROSUVASTATIN CALCIUM 40 MG PO TABS: ORAL_TABLET | ORAL | 0 refills | Status: DC

## 2017-06-17 DIAGNOSIS — F322 Major depressive disorder, single episode, severe without psychotic features: Secondary | ICD-10-CM | POA: Diagnosis not present

## 2017-08-23 DIAGNOSIS — F338 Other recurrent depressive disorders: Secondary | ICD-10-CM | POA: Diagnosis not present

## 2017-09-06 ENCOUNTER — Other Ambulatory Visit: Payer: Self-pay | Admitting: Cardiology

## 2017-09-11 DIAGNOSIS — F419 Anxiety disorder, unspecified: Secondary | ICD-10-CM | POA: Diagnosis not present

## 2017-09-19 DIAGNOSIS — F419 Anxiety disorder, unspecified: Secondary | ICD-10-CM | POA: Diagnosis not present

## 2017-09-25 DIAGNOSIS — F419 Anxiety disorder, unspecified: Secondary | ICD-10-CM | POA: Diagnosis not present

## 2017-10-01 DIAGNOSIS — Z85828 Personal history of other malignant neoplasm of skin: Secondary | ICD-10-CM | POA: Diagnosis not present

## 2017-10-01 DIAGNOSIS — D1801 Hemangioma of skin and subcutaneous tissue: Secondary | ICD-10-CM | POA: Diagnosis not present

## 2017-10-01 DIAGNOSIS — L821 Other seborrheic keratosis: Secondary | ICD-10-CM | POA: Diagnosis not present

## 2017-10-01 DIAGNOSIS — L570 Actinic keratosis: Secondary | ICD-10-CM | POA: Diagnosis not present

## 2017-10-10 ENCOUNTER — Other Ambulatory Visit: Payer: Self-pay | Admitting: Psychiatry

## 2017-10-10 ENCOUNTER — Other Ambulatory Visit: Payer: Self-pay

## 2017-10-10 MED ORDER — MODAFINIL 200 MG PO TABS: 300.0000 mg | ORAL_TABLET | Freq: Every day | ORAL | 0 refills | Status: DC

## 2017-10-10 NOTE — Progress Notes (Signed)
Tried to refill modafinil but it is a stimulant. Will wait for nurse to refill.

## 2017-10-10 NOTE — Telephone Encounter (Signed)
Called into pharmacy, controlled substance.

## 2017-10-11 ENCOUNTER — Other Ambulatory Visit: Payer: Self-pay

## 2017-10-11 MED ORDER — MODAFINIL 200 MG PO TABS: 300.0000 mg | ORAL_TABLET | Freq: Every day | ORAL | 0 refills | Status: DC

## 2017-10-24 ENCOUNTER — Other Ambulatory Visit: Payer: Self-pay | Admitting: Cardiology

## 2017-10-26 ENCOUNTER — Other Ambulatory Visit: Payer: Self-pay | Admitting: Cardiology

## 2017-10-28 ENCOUNTER — Other Ambulatory Visit: Payer: Self-pay | Admitting: Cardiology

## 2017-10-28 MED ORDER — CARVEDILOL 3.125 MG PO TABS: 3.1250 mg | ORAL_TABLET | Freq: Two times a day (BID) | ORAL | 0 refills | Status: DC

## 2017-10-28 NOTE — Telephone Encounter (Signed)
New Message:       *STAT* If patient is at the pharmacy, call can be transferred to refill team.   1. Which medications need to be refilled? (please list name of each medication and dose if known) carvedilol (COREG) 3.125 MG tablet  2. Which pharmacy/location (including street and city if local pharmacy) is medication to be sent to?[facility] [place], [place] - [address]  3. Do they need a 30 day or 90 day supply? Enough to make it to appointment on 11/14/17

## 2017-10-30 DIAGNOSIS — F419 Anxiety disorder, unspecified: Secondary | ICD-10-CM | POA: Diagnosis not present

## 2017-10-30 DIAGNOSIS — R1310 Dysphagia, unspecified: Secondary | ICD-10-CM | POA: Diagnosis not present

## 2017-10-30 DIAGNOSIS — Z23 Encounter for immunization: Secondary | ICD-10-CM | POA: Diagnosis not present

## 2017-11-06 DIAGNOSIS — F419 Anxiety disorder, unspecified: Secondary | ICD-10-CM | POA: Diagnosis not present

## 2017-11-08 ENCOUNTER — Encounter: Payer: Self-pay | Admitting: Emergency Medicine

## 2017-11-08 DIAGNOSIS — F988 Other specified behavioral and emotional disorders with onset usually occurring in childhood and adolescence: Secondary | ICD-10-CM

## 2017-11-08 DIAGNOSIS — F411 Generalized anxiety disorder: Secondary | ICD-10-CM | POA: Insufficient documentation

## 2017-11-14 ENCOUNTER — Ambulatory Visit (INDEPENDENT_AMBULATORY_CARE_PROVIDER_SITE_OTHER): Payer: Medicare Other | Admitting: Cardiology

## 2017-11-14 ENCOUNTER — Encounter: Payer: Self-pay | Admitting: Cardiology

## 2017-11-14 VITALS — BP 140/70 | HR 68 | Ht 68.0 in | Wt 182.2 lb

## 2017-11-14 DIAGNOSIS — R0609 Other forms of dyspnea: Secondary | ICD-10-CM | POA: Diagnosis not present

## 2017-11-14 DIAGNOSIS — I2510 Atherosclerotic heart disease of native coronary artery without angina pectoris: Secondary | ICD-10-CM | POA: Diagnosis not present

## 2017-11-14 DIAGNOSIS — I10 Essential (primary) hypertension: Secondary | ICD-10-CM

## 2017-11-14 NOTE — Patient Instructions (Signed)
Medication Instructions:  The current medical regimen is effective;  continue present plan and medications.  If you need a refill on your cardiac medications before your next appointment, please call your pharmacy.   Follow-Up: At [facility], you and your health needs are our priority.  As part of our continuing mission to provide you with exceptional heart care, we have created designated Provider Care Teams.  These Care Teams include your primary Cardiologist (physician) and Advanced Practice Providers (APPs -  Physician Assistants and Nurse Practitioners) who all work together to provide you with the care you need, when you need it. You will need a follow up appointment in 12 months.  Please call our office 2 months in advance to schedule this appointment.  You may see Dr Skains. or one of the following Advanced Practice Providers on your designated Care Team:   Lori Gerhardt, NP Laura Ingold, NP . Jill McDaniel, NP  Thank you for choosing [facility]!!      

## 2017-11-14 NOTE — Progress Notes (Signed)
Cardiology Office Note:    Date:  11/14/2017   ID:  Colin Villa, DOB 09/20/1940, MRN [id number]  PCP:  Hulan Fess, MD  Cardiologist:  Candee Furbish, MD  Electrophysiologist:  None   Referring MD: Hulan Fess, MD     History of Present Illness:    Colin Villa is a 77 y.o. male here for follow-up of hyperlipidemia with family history of CAD, former smoker.  Last seen on 09/04/2016 by Colin Villa.  Previously there was a nuclear stress test that showed normal perfusion but inferolateral ST segment depression on exercise treadmill test.  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  Medical therapy.  Overall been doing quite well.  No fevers chills nausea vomiting syncope bleeding. Did have some trouble swallowing. Dr. Watt Climes. Feels better.  Thought maybe it was GERD.  He also stopped on his own volition his aspirin 81 mg.  He does have occasional left upper shoulder/chest discomfort.  Mild exertional dyspnea as well.  Has exceptionally dry skin.   Past Medical History:  Diagnosis Date  . ADHD   . BPH (benign prostatic hyperplasia)   . Colonic polyp   . Depression   . Elevated fasting glucose   . Elevated PSA   . Essential hypertension, benign   . Hypertension   . Kidney stones   . Mixed dyslipidemia   . Osteoarthritis    Especially of the left kneee which end stage     Past Surgical History:  Procedure Laterality Date  . COLONOSCOPY    . CORONARY ANGIOGRAPHY  1998  . KNEE SURGERY  1959   medial collateral ligament  . LEFT HEART CATH AND CORONARY ANGIOGRAPHY N/A 08/10/2016   Procedure: LEFT HEART CATH AND CORONARY ANGIOGRAPHY;  Surgeon: Troy Sine, MD;  Location: [place] [facility] [facility];  Service: Cardiovascular;  Laterality: N/A;  . LITHOTRIPSY  1997  . REPLACEMENT TOTAL KNEE Left 2010  . THORACIC AORTOGRAM N/A 08/10/2016   Procedure: Thoracic Aortogram;  Surgeon: Troy Sine, MD;  Location: [place] [facility];  Service: Cardiovascular;  Laterality: N/A;    Current Medications: Current Meds  Medication Sig  . amLODipine (NORVASC) 5 MG tablet Take 5 mg by mouth daily.   . carvedilol (COREG) 3.125 MG tablet Take 1 tablet (3.125 mg total) by mouth 2 (two) times daily. Please keep upcoming appt in November for future refills. Thank you  . clonazePAM (KLONOPIN) 0.5 MG tablet Take 0.5 mg by mouth 2 (two) times daily.  . cloNIDine (CATAPRES) 0.1 MG tablet Take 0.1 mg by mouth 2 (two) times daily.   . modafinil (PROVIGIL) 200 MG tablet Take 200 mg by mouth daily.  Marland Kitchen PARoxetine (PAXIL) 30 MG tablet Take 2 tablets by mouth daily.   . rosuvastatin (CRESTOR) 40 MG tablet TAKE 1 TABLET BY MOUTH DAILY  . tamsulosin (FLOMAX) 0.4 MG CAPS capsule Take 0.4 mg by mouth daily.      Allergies:   Sulfa antibiotics   Social History   Socioeconomic History  . Marital status: Married    Spouse name: Colin Villa  . Number of children: 2  . Years of education: 64  . Highest education level: Not on file  Occupational History    Comment: retired  Scientific laboratory technician  . Financial resource strain: Not on file  . Food insecurity:    Worry: Not on file    Inability: Not on file  .  Transportation needs:    Medical: Not on file    Non-medical: Not on file  Tobacco Use  . Smoking status: Former Smoker    Last attempt to quit: 01/27/1996    Years since quitting: 21.8  . Smokeless tobacco: Never Used  Substance and Sexual Activity  . Alcohol use: Yes    Comment: 4 beers weekly  . Drug use: No  . Sexual activity: Not on file  Lifestyle  . Physical activity:    Days per week: Not on file    Minutes per session: Not on file  . Stress: Not on file  Relationships  . Social connections:    Talks on phone: Not on file    Gets together: Not on file    Attends religious service: Not on file    Active member of club or organization: Not on file    Attends meetings of clubs or  organizations: Not on file    Relationship status: Not on file  Other Topics Concern  . Not on file  Social History Narrative  . Not on file     Family History: The patient's family history includes Cancer in his brother; Diabetes in his brother; Heart failure in his father and mother; Melanoma in his mother; Mental illness in his mother.  ROS:   Please see the history of present illness.     All other systems reviewed and are negative.  EKGs/Labs/Other Studies Reviewed:    The following studies were reviewed today:  Cardiac Catheterization 08/10/16 LAD proximal 15 LCx diminutive and <1 mm arose from ostial region of the RCA RCA proximal 50 LVEDP 22  Nuclear stress test 08/07/16 2 mm inferolateral ST depression, normal perfusion, EF 68 - false positive versus balanced ischemia  Echocardiogram 08/07/16 EF 55-60, normal wall motion, grade 1 diastolic dysfunction, mild AI, mildly dilated aortic root (4.2 cm), MAC, atrial septal aneurysm  EKG:  EKG is  ordered today.  The ekg ordered today demonstrates 11/14/2017-sinus rhythm 68 with no other significant abnormalities.  Recent Labs: 12/06/2016: ALT 21  Recent Lipid Panel    Component Value Date/Time   CHOL 115 12/06/2016 0812   TRIG 151 (H) 12/06/2016 0812   HDL 35 (L) 12/06/2016 0812   CHOLHDL 3.3 12/06/2016 0812   LDLCALC 50 12/06/2016 0812    Physical Exam:    VS:  BP 140/70   Pulse 68   Ht _0  (1.727 m)   Wt 182 lb 3.2 oz (82.6 kg)   BMI 27.70 kg/m     Wt Readings from Last 3 Encounters:  11/14/17 182 lb 3.2 oz (82.6 kg)  09/05/16 189 lb 6.4 oz (85.9 kg)  08/10/16 185 lb (83.9 kg)     GEN:  Well nourished, well developed in no acute distress HEENT: Normal NECK: No JVD; No carotid bruits LYMPHATICS: No lymphadenopathy CARDIAC: RRR, no murmurs, rubs, gallops RESPIRATORY:  Clear to auscultation without rales, wheezing or rhonchi  ABDOMEN: Soft, non-tender, non-distended MUSCULOSKELETAL:  No edema; No  deformity  SKIN: Warm and dry, very dry NEUROLOGIC:  Alert and oriented x 3 PSYCHIATRIC:  Normal affect   ASSESSMENT:    1. Coronary artery disease involving native coronary artery of native heart without angina pectoris   2. Essential hypertension   3. Exertional dyspnea    PLAN:    In order of problems listed above:  Mild nonobstructive CAD -Continue with aggressive secondary risk factor prevention.  Aspirin statin beta-blocker.  Essential hypertension -Currently doing well.  Mildly elevated today.  Continue with current medicines.  Hyperlipidemia - Doing well on current statin therapy.  Crestor 40 mg.  High intensity dose.  LDL 50.  Exertional dyspnea - Significant prior smoking history.  Consider PFTs in the future.  Reassuring heart catheterization   Medication Adjustments/Labs and Tests Ordered: Current medicines are reviewed at length with the patient today.  Concerns regarding medicines are outlined above.  Orders Placed This Encounter  Procedures  . EKG 12-Lead   No orders of the defined types were placed in this encounter.   Patient Instructions  Medication Instructions:  The current medical regimen is effective;  continue present plan and medications.  If you need a refill on your cardiac medications before your next appointment, please call your pharmacy.   Follow-Up: At [facility], you and your health needs are our priority.  As part of our continuing mission to provide you with exceptional heart care, we have created designated Provider Care Teams.  These Care Teams include your primary Cardiologist (physician) and Advanced Practice Providers (APPs -  Physician Assistants and Nurse Practitioners) who all work together to provide you with the care you need, when you need it. You will need a follow up appointment in 12 months.  Please call our office 2 months in advance to schedule this appointment.  You may see Dr Marlou Porch or one of the following Advanced  Practice Providers on your designated Care Team:   Truitt Merle, NP Cecilie Kicks, NP . Kathyrn Drown, NP  Thank you for choosing [facility]!!         Signed, Candee Furbish, MD  11/14/2017 3:42 PM    [place]

## 2017-11-25 ENCOUNTER — Encounter: Payer: Self-pay | Admitting: Psychiatry

## 2017-11-25 ENCOUNTER — Ambulatory Visit (INDEPENDENT_AMBULATORY_CARE_PROVIDER_SITE_OTHER): Payer: Medicare Other | Admitting: Psychiatry

## 2017-11-25 DIAGNOSIS — I2510 Atherosclerotic heart disease of native coronary artery without angina pectoris: Secondary | ICD-10-CM

## 2017-11-25 DIAGNOSIS — F900 Attention-deficit hyperactivity disorder, predominantly inattentive type: Secondary | ICD-10-CM

## 2017-11-25 DIAGNOSIS — F331 Major depressive disorder, recurrent, moderate: Secondary | ICD-10-CM

## 2017-11-25 DIAGNOSIS — F411 Generalized anxiety disorder: Secondary | ICD-10-CM

## 2017-11-25 NOTE — Patient Instructions (Addendum)
Reduce the morning clonazepam to 1/2 tablet each morning.  Give it a couple of weeks to see if the anxiety remains under good control with the paroxetine.  Also try 1/2 modafinil in the morninig to evaluate it's effectiveness for alertness and concentration.  If it is not effective for either then stop it.  Call back if you have any uncertainty as to the effects or how to proceed with the use medicine changes or if there are any other new problems.  Screening for dementia does not suggest any evidence for dementia.

## 2017-11-25 NOTE — Progress Notes (Signed)
Colin Villa [id number] 23-Aug-1940 77 y.o.  Subjective:   Patient ID:  Colin Villa is a 77 y.o. (DOB [birth year]) male.  Chief Complaint:  Chief Complaint  Patient presents with  . Family Problem    chronic marital stress and conflict  . Anxiety  . Medication Problem    medication questions    HPI Colin Villa presents to the office today for follow-up of chronic anxiety and marital stress.  Disc phone call from wife from last week.  Over the years she's voiced chronic complaints of his anger, no physical violence.  She's very provocative and chronically critical.  Dr. Clovis Pu has informed patient's wife after several fruitless conversations and meetings that he would not speak with her again.  If there were emergencies to dial 911.  His wife chronically complains about him.  Do to chronic marital problems he lives off  And on with wife or son.  D says he should not live with  Wife.  His wife alleges that he has dementia.  Does not allege any physical violence.  They do continue to argue.  Taking modafinil accidentally at night did not interfere with sleep.  Switched it back to the am.  Not sure how well it is working for daytime alertness and focus. Not sleepy daytime but less energy mid afternoon.    Patient reports stable mood and denies depressed or irritable moods.  Patient denies any recent difficulty with anxiety other than related to periodic conflict with wife..  Patient denies difficulty with sleep initiation or maintenance. Denies appetite disturbance.  Patient reports that energy and motivation have been good.  Patient denies any difficulty with concentration.  Patient denies any suicidal ideation.   No accidents.  Got lost once going to a school that he had not been to before.  No concerns about his driving.  No driving tickets.  No fender benders.  Says memory is not impaired more than others at 77 yo.  Drinking less than 2 beers once or twice a week.  Prior  psychiatric medication trials include Wellbutrin 400 mg a day, olanzapine, buspirone, Ritalin, lithium 900 mg daily, Depakote, and Strattera.  Review of Systems:  Review of Systems  Gastrointestinal: Positive for abdominal pain.  Neurological: Negative for tremors and weakness.  Psychiatric/Behavioral: Negative for agitation, behavioral problems, confusion, decreased concentration, dysphoric mood, hallucinations, self-injury, sleep disturbance and suicidal ideas. The patient is nervous/anxious. The patient is not hyperactive.    Sees PCP tomorrow. Medications: I have reviewed the patient's current medications.  Current Outpatient Medications  Medication Sig Dispense Refill  . PARoxetine (PAXIL) 30 MG tablet Take 2 tablets by mouth daily.     Marland Kitchen amLODipine (NORVASC) 5 MG tablet Take 5 mg by mouth daily.     . carvedilol (COREG) 3.125 MG tablet Take 1 tablet (3.125 mg total) by mouth 2 (two) times daily. Please keep upcoming appt in November for future refills. Thank you 30 tablet 0  . clonazePAM (KLONOPIN) 0.5 MG tablet Take 0.5 mg by mouth 2 (two) times daily.    . cloNIDine (CATAPRES) 0.1 MG tablet Take 0.1 mg by mouth 2 (two) times daily.     . modafinil (PROVIGIL) 200 MG tablet Take 200 mg by mouth daily.    . rosuvastatin (CRESTOR) 40 MG tablet TAKE 1 TABLET BY MOUTH DAILY 90 tablet 0  . tamsulosin (FLOMAX) 0.4 MG CAPS capsule Take 0.4 mg by mouth daily.   11   No current facility-administered medications  for this visit.     Medication Side Effects: None, no sleepiness.  Allergies:  Allergies  Allergen Reactions  . Sulfa Antibiotics Other (See Comments)    Childhood allergy    Past Medical History:  Diagnosis Date  . ADHD   . BPH (benign prostatic hyperplasia)   . Colonic polyp   . Depression   . Elevated fasting glucose   . Elevated PSA   . Essential hypertension, benign   . Hypertension   . Kidney stones   . Mixed dyslipidemia   . Osteoarthritis    Especially of  the left kneee which end stage     Family History  Problem Relation Age of Onset  . Heart failure Mother   . Melanoma Mother   . Mental illness Mother   . Heart failure Father   . Cancer Brother        bladder  . Diabetes Brother     Social History   Socioeconomic History  . Marital status: Married    Spouse name: [name]  . Number of children: 2  . Years of education: 67  . Highest education level: Not on file  Occupational History    Comment: retired  Scientific laboratory technician  . Financial resource strain: Not on file  . Food insecurity:    Worry: Not on file    Inability: Not on file  . Transportation needs:    Medical: Not on file    Non-medical: Not on file  Tobacco Use  . Smoking status: Former Smoker    Last attempt to quit: 01/27/1996    Years since quitting: 21.8  . Smokeless tobacco: Never Used  Substance and Sexual Activity  . Alcohol use: Yes    Comment: 4 beers weekly  . Drug use: No  . Sexual activity: Not on file  Lifestyle  . Physical activity:    Days per week: Not on file    Minutes per session: Not on file  . Stress: Not on file  Relationships  . Social connections:    Talks on phone: Not on file    Gets together: Not on file    Attends religious service: Not on file    Active member of club or organization: Not on file    Attends meetings of clubs or organizations: Not on file    Relationship status: Not on file  . Intimate partner violence:    Fear of current or ex partner: Not on file    Emotionally abused: Not on file    Physically abused: Not on file    Forced sexual activity: Not on file  Other Topics Concern  . Not on file  Social History Narrative  . Not on file    Past Medical History, Surgical history, Social history, and Family history were reviewed and updated as appropriate.   Please see review of systems for further details on the patient's review from today.   Objective:   Physical Exam:  There were no vitals taken for this  visit.  Physical Exam  Constitutional: He is oriented to person, place, and time. He appears well-developed. No distress.  Musculoskeletal: He exhibits no deformity.  Neurological: He is alert and oriented to person, place, and time. He displays no tremor. Coordination and gait normal.  Psychiatric: He has a normal mood and affect. His speech is normal and behavior is normal. Judgment and thought content normal. His mood appears not anxious. His affect is not angry, not blunt, not labile and  not inappropriate. Cognition and memory are normal. He does not exhibit a depressed mood. He expresses no homicidal and no suicidal ideation. He expresses no suicidal plans and no homicidal plans.  Insight intact. No auditory or visual hallucinations. No delusions.  Brief periods of depression, not sustained and usually reactive. Oriented in all spheres.  Immed memory 3/3.  Recall at 2'=3/3. Serial 7's quickly and correctly. He is attentive.    Lab Review:     Component Value Date/Time   NA 137 08/10/2016 1202   K 4.1 08/10/2016 1202   CL 106 08/10/2016 1202   CO2 24 08/10/2016 1202   GLUCOSE 138 (H) 08/10/2016 1202   BUN 9 08/10/2016 1202   CREATININE 0.90 08/10/2016 1202   CALCIUM 9.1 08/10/2016 1202   PROT 6.6 12/06/2016 0812   ALBUMIN 4.4 12/06/2016 0812   AST 24 12/06/2016 0812   ALT 21 12/06/2016 0812   ALKPHOS 77 12/06/2016 0812   BILITOT 0.6 12/06/2016 0812   GFRNONAA >60 08/10/2016 1202   GFRAA >60 08/10/2016 1202       Component Value Date/Time   WBC 6.3 08/10/2016 1202   RBC 4.31 08/10/2016 1202   HGB 14.3 08/10/2016 1202   HCT 39.2 08/10/2016 1202   PLT 114 (L) 08/10/2016 1202   MCV 91.0 08/10/2016 1202   MCH 33.2 08/10/2016 1202   MCHC 36.5 (H) 08/10/2016 1202   RDW 13.4 08/10/2016 1202    No results found for: POCLITH, LITHIUM   No results found for: PHENYTOIN, PHENOBARB, VALPROATE, CBMZ   .res Assessment: Plan:    Major depressive disorder, recurrent episode,  moderate (HCC)  Generalized anxiety disorder  Attention deficit hyperactivity disorder (ADHD), predominantly inattentive type   Disc W's desire to meet with me and my refusal to meet with her again.  I have made this clear for several years.  I have met with her and the patient together several times in the past and it has never been useful.  They are welcome to seek another psychiatrist is they are not satisfied with this arrangement.  I have offered this multiple times.  I do not believe it is in the patient's interest for me to meet with his wife.  Disc his codependence and chronic marital problems.  "I don't know why I keep trying.  When I'm there she doesn't want me, when I'm gone, she does."  They are in therapy again.    Disc timing of the modafinil and the dosage.    We discussed the short-term risks associated with benzodiazepines including sedation and increased fall risk among others.  Discussed long-term side effect risk including dependence, potential withdrawal symptoms, and the potential eventual dose-related risk of dementia.  Recommend reducing the am dosage of clonazepam to help protect memory.  Try to reduce the morning clonazepam to 1/2 tablet in the morning.  Be careful about any alcohol with the clonazepam.  Disc high dosage paroxetine.  He tolerates it and benefits.  He remains under a lot of stress.  Screening for dementia was entirely negative.  FU 6 mos.  This appt was 35 mins.  Please see After Visit Summary for patient specific instructions.: Reduce the morning clonazepam to 1/2 tablet each morning.  Give it a couple of weeks to see if the anxiety remains under good control with the paroxetine.  Also try 1/2 modafinil in the morninig to evaluate it's effectiveness for alertness and concentration.  If it is not effective for either then stop it.  Call back if you have any uncertainty as to the effects or how to proceed with the use medicine changes or if there are  any other new problems.  Screening for dementia does not suggest any evidence for dementia.  Lynder Parents, MD, DFAPA   Future Appointments  Date Time Provider [place]  05/26/2018  9:00 AM Cottle, Billey Co., MD CP-CP None    No orders of the defined types were placed in this encounter.     -------------------------------

## 2017-11-26 DIAGNOSIS — R1310 Dysphagia, unspecified: Secondary | ICD-10-CM | POA: Diagnosis not present

## 2017-11-26 DIAGNOSIS — F419 Anxiety disorder, unspecified: Secondary | ICD-10-CM | POA: Diagnosis not present

## 2017-11-27 ENCOUNTER — Other Ambulatory Visit: Payer: Self-pay | Admitting: Cardiology

## 2017-11-29 ENCOUNTER — Other Ambulatory Visit: Payer: Self-pay | Admitting: Gastroenterology

## 2017-11-29 DIAGNOSIS — C159 Malignant neoplasm of esophagus, unspecified: Secondary | ICD-10-CM

## 2017-11-29 DIAGNOSIS — R1310 Dysphagia, unspecified: Secondary | ICD-10-CM | POA: Diagnosis not present

## 2017-12-02 ENCOUNTER — Ambulatory Visit
Admission: RE | Admit: 2017-12-02 | Discharge: 2017-12-02 | Disposition: A | Discharge status: Home / Self Care | Payer: Medicare Other | Source: Ambulatory Visit | Attending: Gastroenterology | Admitting: Gastroenterology

## 2017-12-02 ENCOUNTER — Other Ambulatory Visit: Payer: Self-pay | Admitting: Gastroenterology

## 2017-12-02 ENCOUNTER — Ambulatory Visit (HOSPITAL_COMMUNITY): Payer: Medicare Other

## 2017-12-02 DIAGNOSIS — C159 Malignant neoplasm of esophagus, unspecified: Secondary | ICD-10-CM

## 2017-12-02 MED ORDER — IOPAMIDOL (ISOVUE-300) INJECTION 61%
100.0000 mL | Freq: Once | INTRAVENOUS | Status: AC | PRN
  Administered 2017-12-02: 100 mL via INTRAVENOUS

## 2017-12-10 DIAGNOSIS — C159 Malignant neoplasm of esophagus, unspecified: Secondary | ICD-10-CM | POA: Diagnosis not present

## 2017-12-13 DIAGNOSIS — C155 Malignant neoplasm of lower third of esophagus: Secondary | ICD-10-CM | POA: Diagnosis not present

## 2017-12-13 DIAGNOSIS — R1310 Dysphagia, unspecified: Secondary | ICD-10-CM | POA: Diagnosis not present

## 2017-12-13 DIAGNOSIS — Z87891 Personal history of nicotine dependence: Secondary | ICD-10-CM | POA: Diagnosis not present

## 2017-12-13 DIAGNOSIS — Z8052 Family history of malignant neoplasm of bladder: Secondary | ICD-10-CM | POA: Diagnosis not present

## 2017-12-13 DIAGNOSIS — Z808 Family history of malignant neoplasm of other organs or systems: Secondary | ICD-10-CM | POA: Diagnosis not present

## 2017-12-17 DIAGNOSIS — C159 Malignant neoplasm of esophagus, unspecified: Secondary | ICD-10-CM | POA: Diagnosis not present

## 2017-12-17 DIAGNOSIS — D125 Benign neoplasm of sigmoid colon: Secondary | ICD-10-CM | POA: Diagnosis not present

## 2017-12-17 DIAGNOSIS — D122 Benign neoplasm of ascending colon: Secondary | ICD-10-CM | POA: Diagnosis not present

## 2017-12-17 DIAGNOSIS — D120 Benign neoplasm of cecum: Secondary | ICD-10-CM | POA: Diagnosis not present

## 2017-12-18 DIAGNOSIS — C155 Malignant neoplasm of lower third of esophagus: Secondary | ICD-10-CM | POA: Diagnosis not present

## 2017-12-18 DIAGNOSIS — Z87891 Personal history of nicotine dependence: Secondary | ICD-10-CM | POA: Diagnosis not present

## 2017-12-18 DIAGNOSIS — K228 Other specified diseases of esophagus: Secondary | ICD-10-CM | POA: Diagnosis not present

## 2017-12-18 DIAGNOSIS — D490 Neoplasm of unspecified behavior of digestive system: Secondary | ICD-10-CM | POA: Diagnosis not present

## 2017-12-23 DIAGNOSIS — C155 Malignant neoplasm of lower third of esophagus: Secondary | ICD-10-CM | POA: Diagnosis not present

## 2017-12-25 DIAGNOSIS — C155 Malignant neoplasm of lower third of esophagus: Secondary | ICD-10-CM | POA: Diagnosis not present

## 2018-01-06 DIAGNOSIS — C155 Malignant neoplasm of lower third of esophagus: Secondary | ICD-10-CM | POA: Diagnosis not present

## 2018-01-09 DIAGNOSIS — C155 Malignant neoplasm of lower third of esophagus: Secondary | ICD-10-CM | POA: Diagnosis not present

## 2018-01-10 DIAGNOSIS — Z5111 Encounter for antineoplastic chemotherapy: Secondary | ICD-10-CM | POA: Diagnosis not present

## 2018-01-10 DIAGNOSIS — C155 Malignant neoplasm of lower third of esophagus: Secondary | ICD-10-CM | POA: Diagnosis not present

## 2018-01-10 DIAGNOSIS — C154 Malignant neoplasm of middle third of esophagus: Secondary | ICD-10-CM | POA: Diagnosis not present

## 2018-01-10 DIAGNOSIS — Z510 Encounter for antineoplastic radiation therapy: Secondary | ICD-10-CM | POA: Diagnosis not present

## 2018-01-13 DIAGNOSIS — C155 Malignant neoplasm of lower third of esophagus: Secondary | ICD-10-CM | POA: Diagnosis not present

## 2018-01-14 DIAGNOSIS — C155 Malignant neoplasm of lower third of esophagus: Secondary | ICD-10-CM | POA: Diagnosis not present

## 2018-01-15 DIAGNOSIS — C155 Malignant neoplasm of lower third of esophagus: Secondary | ICD-10-CM | POA: Diagnosis not present

## 2018-01-16 DIAGNOSIS — Z85828 Personal history of other malignant neoplasm of skin: Secondary | ICD-10-CM | POA: Diagnosis not present

## 2018-01-16 DIAGNOSIS — R110 Nausea: Secondary | ICD-10-CM | POA: Diagnosis not present

## 2018-01-16 DIAGNOSIS — Z79899 Other long term (current) drug therapy: Secondary | ICD-10-CM | POA: Diagnosis not present

## 2018-01-16 DIAGNOSIS — Z5111 Encounter for antineoplastic chemotherapy: Secondary | ICD-10-CM | POA: Diagnosis not present

## 2018-01-16 DIAGNOSIS — Z87891 Personal history of nicotine dependence: Secondary | ICD-10-CM | POA: Diagnosis not present

## 2018-01-16 DIAGNOSIS — C155 Malignant neoplasm of lower third of esophagus: Secondary | ICD-10-CM | POA: Diagnosis not present

## 2018-01-16 DIAGNOSIS — C159 Malignant neoplasm of esophagus, unspecified: Secondary | ICD-10-CM | POA: Diagnosis not present

## 2018-01-16 DIAGNOSIS — C154 Malignant neoplasm of middle third of esophagus: Secondary | ICD-10-CM | POA: Diagnosis not present

## 2018-01-17 DIAGNOSIS — C155 Malignant neoplasm of lower third of esophagus: Secondary | ICD-10-CM | POA: Diagnosis not present

## 2018-01-20 DIAGNOSIS — C155 Malignant neoplasm of lower third of esophagus: Secondary | ICD-10-CM | POA: Diagnosis not present

## 2018-01-21 DIAGNOSIS — C159 Malignant neoplasm of esophagus, unspecified: Secondary | ICD-10-CM | POA: Diagnosis not present

## 2018-01-21 DIAGNOSIS — I2510 Atherosclerotic heart disease of native coronary artery without angina pectoris: Secondary | ICD-10-CM | POA: Diagnosis not present

## 2018-01-21 DIAGNOSIS — I319 Disease of pericardium, unspecified: Secondary | ICD-10-CM | POA: Diagnosis not present

## 2018-01-21 DIAGNOSIS — C155 Malignant neoplasm of lower third of esophagus: Secondary | ICD-10-CM | POA: Diagnosis not present

## 2018-01-21 DIAGNOSIS — J9811 Atelectasis: Secondary | ICD-10-CM | POA: Diagnosis not present

## 2018-01-21 DIAGNOSIS — E785 Hyperlipidemia, unspecified: Secondary | ICD-10-CM | POA: Diagnosis not present

## 2018-01-21 DIAGNOSIS — R590 Localized enlarged lymph nodes: Secondary | ICD-10-CM | POA: Diagnosis not present

## 2018-01-21 DIAGNOSIS — I10 Essential (primary) hypertension: Secondary | ICD-10-CM | POA: Diagnosis not present

## 2018-01-21 DIAGNOSIS — R0789 Other chest pain: Secondary | ICD-10-CM | POA: Diagnosis not present

## 2018-01-21 DIAGNOSIS — Z87891 Personal history of nicotine dependence: Secondary | ICD-10-CM | POA: Diagnosis not present

## 2018-01-22 DIAGNOSIS — C159 Malignant neoplasm of esophagus, unspecified: Secondary | ICD-10-CM | POA: Diagnosis not present

## 2018-01-22 DIAGNOSIS — J9811 Atelectasis: Secondary | ICD-10-CM | POA: Diagnosis not present

## 2018-01-22 DIAGNOSIS — C155 Malignant neoplasm of lower third of esophagus: Secondary | ICD-10-CM | POA: Diagnosis not present

## 2018-01-22 DIAGNOSIS — R590 Localized enlarged lymph nodes: Secondary | ICD-10-CM | POA: Diagnosis not present

## 2018-01-23 DIAGNOSIS — C155 Malignant neoplasm of lower third of esophagus: Secondary | ICD-10-CM | POA: Diagnosis not present

## 2018-01-24 DIAGNOSIS — R1310 Dysphagia, unspecified: Secondary | ICD-10-CM | POA: Diagnosis not present

## 2018-01-24 DIAGNOSIS — Z87891 Personal history of nicotine dependence: Secondary | ICD-10-CM | POA: Diagnosis not present

## 2018-01-24 DIAGNOSIS — R110 Nausea: Secondary | ICD-10-CM | POA: Diagnosis not present

## 2018-01-24 DIAGNOSIS — Z79899 Other long term (current) drug therapy: Secondary | ICD-10-CM | POA: Diagnosis not present

## 2018-01-24 DIAGNOSIS — C155 Malignant neoplasm of lower third of esophagus: Secondary | ICD-10-CM | POA: Diagnosis not present

## 2018-01-24 DIAGNOSIS — Z5111 Encounter for antineoplastic chemotherapy: Secondary | ICD-10-CM | POA: Diagnosis not present

## 2018-01-24 DIAGNOSIS — R079 Chest pain, unspecified: Secondary | ICD-10-CM | POA: Diagnosis not present

## 2018-01-24 DIAGNOSIS — I309 Acute pericarditis, unspecified: Secondary | ICD-10-CM | POA: Diagnosis not present

## 2018-01-24 DIAGNOSIS — I319 Disease of pericardium, unspecified: Secondary | ICD-10-CM | POA: Diagnosis not present

## 2018-01-24 DIAGNOSIS — C154 Malignant neoplasm of middle third of esophagus: Secondary | ICD-10-CM | POA: Diagnosis not present

## 2018-01-27 ENCOUNTER — Telehealth: Payer: Self-pay

## 2018-01-27 MED ORDER — PAROXETINE HCL 30 MG PO TABS: 60.0000 mg | ORAL_TABLET | Freq: Every day | ORAL | 1 refills | Status: DC

## 2018-01-27 NOTE — Telephone Encounter (Signed)
Refill request from [facility] for pt's paroxetine 30 mg two tablets daily #180, 1 refill Escribed per request      Last OV 11/25/2017 Next OV 05/26/2018

## 2018-01-28 DIAGNOSIS — C155 Malignant neoplasm of lower third of esophagus: Secondary | ICD-10-CM | POA: Diagnosis not present

## 2018-01-29 ENCOUNTER — Other Ambulatory Visit: Payer: Self-pay | Admitting: Cardiology

## 2018-01-29 DIAGNOSIS — C155 Malignant neoplasm of lower third of esophagus: Secondary | ICD-10-CM | POA: Diagnosis not present

## 2018-01-30 DIAGNOSIS — C155 Malignant neoplasm of lower third of esophagus: Secondary | ICD-10-CM | POA: Diagnosis not present

## 2018-01-31 DIAGNOSIS — R071 Chest pain on breathing: Secondary | ICD-10-CM | POA: Diagnosis not present

## 2018-01-31 DIAGNOSIS — Z5111 Encounter for antineoplastic chemotherapy: Secondary | ICD-10-CM | POA: Diagnosis not present

## 2018-01-31 DIAGNOSIS — Z8501 Personal history of malignant neoplasm of esophagus: Secondary | ICD-10-CM | POA: Diagnosis not present

## 2018-01-31 DIAGNOSIS — Z87891 Personal history of nicotine dependence: Secondary | ICD-10-CM | POA: Diagnosis not present

## 2018-01-31 DIAGNOSIS — C154 Malignant neoplasm of middle third of esophagus: Secondary | ICD-10-CM | POA: Diagnosis not present

## 2018-01-31 DIAGNOSIS — R590 Localized enlarged lymph nodes: Secondary | ICD-10-CM | POA: Diagnosis not present

## 2018-01-31 DIAGNOSIS — R42 Dizziness and giddiness: Secondary | ICD-10-CM | POA: Diagnosis not present

## 2018-01-31 DIAGNOSIS — R0781 Pleurodynia: Secondary | ICD-10-CM | POA: Diagnosis not present

## 2018-01-31 DIAGNOSIS — C155 Malignant neoplasm of lower third of esophagus: Secondary | ICD-10-CM | POA: Diagnosis not present

## 2018-01-31 DIAGNOSIS — Z510 Encounter for antineoplastic radiation therapy: Secondary | ICD-10-CM | POA: Diagnosis not present

## 2018-02-03 DIAGNOSIS — C155 Malignant neoplasm of lower third of esophagus: Secondary | ICD-10-CM | POA: Diagnosis not present

## 2018-02-04 DIAGNOSIS — C155 Malignant neoplasm of lower third of esophagus: Secondary | ICD-10-CM | POA: Diagnosis not present

## 2018-02-05 DIAGNOSIS — Z510 Encounter for antineoplastic radiation therapy: Secondary | ICD-10-CM | POA: Diagnosis not present

## 2018-02-05 DIAGNOSIS — C155 Malignant neoplasm of lower third of esophagus: Secondary | ICD-10-CM | POA: Diagnosis not present

## 2018-02-06 DIAGNOSIS — C155 Malignant neoplasm of lower third of esophagus: Secondary | ICD-10-CM | POA: Diagnosis not present

## 2018-02-07 DIAGNOSIS — C154 Malignant neoplasm of middle third of esophagus: Secondary | ICD-10-CM | POA: Diagnosis not present

## 2018-02-07 DIAGNOSIS — Z510 Encounter for antineoplastic radiation therapy: Secondary | ICD-10-CM | POA: Diagnosis not present

## 2018-02-07 DIAGNOSIS — R1310 Dysphagia, unspecified: Secondary | ICD-10-CM | POA: Diagnosis not present

## 2018-02-07 DIAGNOSIS — Z87891 Personal history of nicotine dependence: Secondary | ICD-10-CM | POA: Diagnosis not present

## 2018-02-07 DIAGNOSIS — E860 Dehydration: Secondary | ICD-10-CM | POA: Diagnosis not present

## 2018-02-07 DIAGNOSIS — C155 Malignant neoplasm of lower third of esophagus: Secondary | ICD-10-CM | POA: Diagnosis not present

## 2018-02-07 DIAGNOSIS — Z5111 Encounter for antineoplastic chemotherapy: Secondary | ICD-10-CM | POA: Diagnosis not present

## 2018-02-10 DIAGNOSIS — C155 Malignant neoplasm of lower third of esophagus: Secondary | ICD-10-CM | POA: Diagnosis not present

## 2018-02-11 DIAGNOSIS — C155 Malignant neoplasm of lower third of esophagus: Secondary | ICD-10-CM | POA: Diagnosis not present

## 2018-02-11 DIAGNOSIS — C154 Malignant neoplasm of middle third of esophagus: Secondary | ICD-10-CM | POA: Diagnosis not present

## 2018-02-14 DIAGNOSIS — C154 Malignant neoplasm of middle third of esophagus: Secondary | ICD-10-CM | POA: Diagnosis not present

## 2018-02-17 DIAGNOSIS — C154 Malignant neoplasm of middle third of esophagus: Secondary | ICD-10-CM | POA: Diagnosis not present

## 2018-02-17 DIAGNOSIS — R1310 Dysphagia, unspecified: Secondary | ICD-10-CM | POA: Diagnosis not present

## 2018-02-24 DIAGNOSIS — R1310 Dysphagia, unspecified: Secondary | ICD-10-CM | POA: Diagnosis not present

## 2018-02-24 DIAGNOSIS — C154 Malignant neoplasm of middle third of esophagus: Secondary | ICD-10-CM | POA: Diagnosis not present

## 2018-02-28 DIAGNOSIS — R739 Hyperglycemia, unspecified: Secondary | ICD-10-CM | POA: Diagnosis not present

## 2018-02-28 DIAGNOSIS — R531 Weakness: Secondary | ICD-10-CM | POA: Diagnosis not present

## 2018-02-28 DIAGNOSIS — Z85828 Personal history of other malignant neoplasm of skin: Secondary | ICD-10-CM | POA: Diagnosis not present

## 2018-02-28 DIAGNOSIS — Z923 Personal history of irradiation: Secondary | ICD-10-CM | POA: Diagnosis not present

## 2018-02-28 DIAGNOSIS — Z96652 Presence of left artificial knee joint: Secondary | ICD-10-CM | POA: Diagnosis present

## 2018-02-28 DIAGNOSIS — Z87891 Personal history of nicotine dependence: Secondary | ICD-10-CM | POA: Diagnosis not present

## 2018-02-28 DIAGNOSIS — R071 Chest pain on breathing: Secondary | ICD-10-CM | POA: Diagnosis not present

## 2018-02-28 DIAGNOSIS — G893 Neoplasm related pain (acute) (chronic): Secondary | ICD-10-CM | POA: Diagnosis present

## 2018-02-28 DIAGNOSIS — C154 Malignant neoplasm of middle third of esophagus: Secondary | ICD-10-CM | POA: Diagnosis not present

## 2018-02-28 DIAGNOSIS — E871 Hypo-osmolality and hyponatremia: Secondary | ICD-10-CM | POA: Diagnosis not present

## 2018-02-28 DIAGNOSIS — I10 Essential (primary) hypertension: Secondary | ICD-10-CM | POA: Diagnosis present

## 2018-02-28 DIAGNOSIS — F419 Anxiety disorder, unspecified: Secondary | ICD-10-CM | POA: Diagnosis not present

## 2018-02-28 DIAGNOSIS — R7989 Other specified abnormal findings of blood chemistry: Secondary | ICD-10-CM | POA: Diagnosis not present

## 2018-02-28 DIAGNOSIS — R062 Wheezing: Secondary | ICD-10-CM | POA: Diagnosis not present

## 2018-02-28 DIAGNOSIS — E785 Hyperlipidemia, unspecified: Secondary | ICD-10-CM | POA: Diagnosis present

## 2018-02-28 DIAGNOSIS — D696 Thrombocytopenia, unspecified: Secondary | ICD-10-CM | POA: Diagnosis not present

## 2018-02-28 DIAGNOSIS — Z8501 Personal history of malignant neoplasm of esophagus: Secondary | ICD-10-CM | POA: Diagnosis not present

## 2018-02-28 DIAGNOSIS — F418 Other specified anxiety disorders: Secondary | ICD-10-CM | POA: Diagnosis present

## 2018-02-28 DIAGNOSIS — I308 Other forms of acute pericarditis: Secondary | ICD-10-CM | POA: Diagnosis not present

## 2018-02-28 DIAGNOSIS — D61818 Other pancytopenia: Secondary | ICD-10-CM | POA: Diagnosis present

## 2018-02-28 DIAGNOSIS — D702 Other drug-induced agranulocytosis: Secondary | ICD-10-CM | POA: Diagnosis not present

## 2018-02-28 DIAGNOSIS — R0600 Dyspnea, unspecified: Secondary | ICD-10-CM | POA: Diagnosis not present

## 2018-02-28 DIAGNOSIS — I2510 Atherosclerotic heart disease of native coronary artery without angina pectoris: Secondary | ICD-10-CM | POA: Diagnosis present

## 2018-02-28 DIAGNOSIS — R0602 Shortness of breath: Secondary | ICD-10-CM | POA: Diagnosis not present

## 2018-02-28 DIAGNOSIS — T451X5A Adverse effect of antineoplastic and immunosuppressive drugs, initial encounter: Secondary | ICD-10-CM | POA: Diagnosis present

## 2018-02-28 DIAGNOSIS — I309 Acute pericarditis, unspecified: Secondary | ICD-10-CM | POA: Diagnosis present

## 2018-02-28 DIAGNOSIS — I4892 Unspecified atrial flutter: Secondary | ICD-10-CM | POA: Diagnosis present

## 2018-02-28 DIAGNOSIS — I517 Cardiomegaly: Secondary | ICD-10-CM | POA: Diagnosis not present

## 2018-02-28 DIAGNOSIS — R000 Tachycardia, unspecified: Secondary | ICD-10-CM | POA: Diagnosis not present

## 2018-02-28 DIAGNOSIS — I952 Hypotension due to drugs: Secondary | ICD-10-CM | POA: Diagnosis not present

## 2018-02-28 DIAGNOSIS — I959 Hypotension, unspecified: Secondary | ICD-10-CM | POA: Diagnosis not present

## 2018-02-28 DIAGNOSIS — I248 Other forms of acute ischemic heart disease: Secondary | ICD-10-CM | POA: Diagnosis not present

## 2018-02-28 DIAGNOSIS — N400 Enlarged prostate without lower urinary tract symptoms: Secondary | ICD-10-CM | POA: Diagnosis present

## 2018-02-28 DIAGNOSIS — F329 Major depressive disorder, single episode, unspecified: Secondary | ICD-10-CM | POA: Diagnosis not present

## 2018-02-28 DIAGNOSIS — I351 Nonrheumatic aortic (valve) insufficiency: Secondary | ICD-10-CM | POA: Diagnosis not present

## 2018-02-28 DIAGNOSIS — Z79899 Other long term (current) drug therapy: Secondary | ICD-10-CM | POA: Diagnosis not present

## 2018-02-28 DIAGNOSIS — D649 Anemia, unspecified: Secondary | ICD-10-CM | POA: Diagnosis not present

## 2018-02-28 DIAGNOSIS — T66XXXA Radiation sickness, unspecified, initial encounter: Secondary | ICD-10-CM | POA: Diagnosis present

## 2018-03-01 DIAGNOSIS — Z923 Personal history of irradiation: Secondary | ICD-10-CM | POA: Diagnosis not present

## 2018-03-01 DIAGNOSIS — D61818 Other pancytopenia: Secondary | ICD-10-CM | POA: Diagnosis present

## 2018-03-01 DIAGNOSIS — D696 Thrombocytopenia, unspecified: Secondary | ICD-10-CM | POA: Diagnosis not present

## 2018-03-01 DIAGNOSIS — E785 Hyperlipidemia, unspecified: Secondary | ICD-10-CM | POA: Diagnosis present

## 2018-03-01 DIAGNOSIS — I309 Acute pericarditis, unspecified: Secondary | ICD-10-CM | POA: Diagnosis present

## 2018-03-01 DIAGNOSIS — F418 Other specified anxiety disorders: Secondary | ICD-10-CM | POA: Diagnosis present

## 2018-03-01 DIAGNOSIS — F419 Anxiety disorder, unspecified: Secondary | ICD-10-CM | POA: Diagnosis not present

## 2018-03-01 DIAGNOSIS — R0600 Dyspnea, unspecified: Secondary | ICD-10-CM | POA: Diagnosis not present

## 2018-03-01 DIAGNOSIS — Z96652 Presence of left artificial knee joint: Secondary | ICD-10-CM | POA: Diagnosis present

## 2018-03-01 DIAGNOSIS — D702 Other drug-induced agranulocytosis: Secondary | ICD-10-CM | POA: Diagnosis not present

## 2018-03-01 DIAGNOSIS — I308 Other forms of acute pericarditis: Secondary | ICD-10-CM | POA: Diagnosis not present

## 2018-03-01 DIAGNOSIS — I959 Hypotension, unspecified: Secondary | ICD-10-CM | POA: Diagnosis not present

## 2018-03-01 DIAGNOSIS — R071 Chest pain on breathing: Secondary | ICD-10-CM | POA: Diagnosis not present

## 2018-03-01 DIAGNOSIS — Z79899 Other long term (current) drug therapy: Secondary | ICD-10-CM | POA: Diagnosis not present

## 2018-03-01 DIAGNOSIS — I4892 Unspecified atrial flutter: Secondary | ICD-10-CM | POA: Diagnosis present

## 2018-03-01 DIAGNOSIS — I517 Cardiomegaly: Secondary | ICD-10-CM | POA: Diagnosis not present

## 2018-03-01 DIAGNOSIS — T451X5A Adverse effect of antineoplastic and immunosuppressive drugs, initial encounter: Secondary | ICD-10-CM | POA: Diagnosis present

## 2018-03-01 DIAGNOSIS — I351 Nonrheumatic aortic (valve) insufficiency: Secondary | ICD-10-CM | POA: Diagnosis not present

## 2018-03-01 DIAGNOSIS — Z8501 Personal history of malignant neoplasm of esophagus: Secondary | ICD-10-CM | POA: Diagnosis not present

## 2018-03-01 DIAGNOSIS — Z85828 Personal history of other malignant neoplasm of skin: Secondary | ICD-10-CM | POA: Diagnosis not present

## 2018-03-01 DIAGNOSIS — F329 Major depressive disorder, single episode, unspecified: Secondary | ICD-10-CM | POA: Diagnosis not present

## 2018-03-01 DIAGNOSIS — T66XXXA Radiation sickness, unspecified, initial encounter: Secondary | ICD-10-CM | POA: Diagnosis present

## 2018-03-01 DIAGNOSIS — R531 Weakness: Secondary | ICD-10-CM | POA: Diagnosis not present

## 2018-03-01 DIAGNOSIS — E871 Hypo-osmolality and hyponatremia: Secondary | ICD-10-CM | POA: Diagnosis not present

## 2018-03-01 DIAGNOSIS — N400 Enlarged prostate without lower urinary tract symptoms: Secondary | ICD-10-CM | POA: Diagnosis present

## 2018-03-01 DIAGNOSIS — I248 Other forms of acute ischemic heart disease: Secondary | ICD-10-CM | POA: Diagnosis not present

## 2018-03-01 DIAGNOSIS — D649 Anemia, unspecified: Secondary | ICD-10-CM | POA: Diagnosis not present

## 2018-03-01 DIAGNOSIS — C154 Malignant neoplasm of middle third of esophagus: Secondary | ICD-10-CM | POA: Diagnosis not present

## 2018-03-01 DIAGNOSIS — R0602 Shortness of breath: Secondary | ICD-10-CM | POA: Diagnosis not present

## 2018-03-01 DIAGNOSIS — R7989 Other specified abnormal findings of blood chemistry: Secondary | ICD-10-CM | POA: Diagnosis not present

## 2018-03-01 DIAGNOSIS — Z87891 Personal history of nicotine dependence: Secondary | ICD-10-CM | POA: Diagnosis not present

## 2018-03-01 DIAGNOSIS — G893 Neoplasm related pain (acute) (chronic): Secondary | ICD-10-CM | POA: Diagnosis present

## 2018-03-01 DIAGNOSIS — I10 Essential (primary) hypertension: Secondary | ICD-10-CM | POA: Diagnosis present

## 2018-03-01 DIAGNOSIS — R739 Hyperglycemia, unspecified: Secondary | ICD-10-CM | POA: Diagnosis not present

## 2018-03-01 DIAGNOSIS — I952 Hypotension due to drugs: Secondary | ICD-10-CM | POA: Diagnosis not present

## 2018-03-01 DIAGNOSIS — I2510 Atherosclerotic heart disease of native coronary artery without angina pectoris: Secondary | ICD-10-CM | POA: Diagnosis present

## 2018-03-01 DIAGNOSIS — R000 Tachycardia, unspecified: Secondary | ICD-10-CM | POA: Diagnosis not present

## 2018-03-11 DIAGNOSIS — D61818 Other pancytopenia: Secondary | ICD-10-CM | POA: Diagnosis not present

## 2018-03-11 DIAGNOSIS — I483 Typical atrial flutter: Secondary | ICD-10-CM | POA: Diagnosis not present

## 2018-03-11 DIAGNOSIS — I10 Essential (primary) hypertension: Secondary | ICD-10-CM | POA: Diagnosis not present

## 2018-03-11 DIAGNOSIS — I2510 Atherosclerotic heart disease of native coronary artery without angina pectoris: Secondary | ICD-10-CM | POA: Diagnosis not present

## 2018-03-11 DIAGNOSIS — I309 Acute pericarditis, unspecified: Secondary | ICD-10-CM | POA: Diagnosis not present

## 2018-03-13 DIAGNOSIS — C154 Malignant neoplasm of middle third of esophagus: Secondary | ICD-10-CM | POA: Diagnosis not present

## 2018-03-21 ENCOUNTER — Telehealth: Payer: Self-pay | Admitting: Psychiatry

## 2018-03-21 NOTE — Telephone Encounter (Signed)
Colin Villa, is wife, called to get counsel, and handed the phone over to [place]. [name] is struggling after chemo and cancer treatment.  Trouble sleeping due to anxiety that he may not wake up or he'll stop breathing, feels very scared and anxious, violent thoughts at times.  Feels Paxil isn't helping any more.

## 2018-03-21 NOTE — Telephone Encounter (Signed)
Santiago Glad called stated that the patient is in a bad way has no energy, just finished readiation and chemotherapy, having shortness of breath and having thoughts, scared to be alone and the Paxil is not working, Santiago Glad stated that the patient was asleep so she wanted to call and let someone know what was going on.

## 2018-03-24 MED ORDER — MIRTAZAPINE 30 MG PO TABS: 30.0000 mg | ORAL_TABLET | Freq: Every day | ORAL | 1 refills | Status: DC

## 2018-03-24 NOTE — Telephone Encounter (Signed)
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  [place]  Last 2 weeks.  Low energy and worry over breathing.   Scared all the time.  Intrusive images of disaster.  They just come.  I'm as depressed as I've ever been.  Not suicidal.  Also had heart issues.  Talk to medical doctors about his symptoms.  At the highest dosage of paroxetine.  Change would be risky from this med.  Consideration could be given to Abilify which probably has the most potential to help the depression but paroxetine elevated blood levels of Abilify which would make dosing a little tricky.  Also occasionally it can increase anxiety.  Therefore will try more conservative approach of adding 30 mg mirtazapine 1 nightly.  Discussed side effects.  Recommend he make an earlier appointment.  He agreed.

## 2018-05-26 ENCOUNTER — Ambulatory Visit: Payer: Medicare Other | Admitting: Psychiatry

## 2018-05-29 ENCOUNTER — Other Ambulatory Visit: Payer: Self-pay | Admitting: Psychiatry

## 2018-05-29 ENCOUNTER — Telehealth: Payer: Self-pay | Admitting: Psychiatry

## 2018-05-29 NOTE — Telephone Encounter (Signed)
Pt wife Santiago Glad called. Need something to calm pt down PRN. Having a lot of anxiety and anger. They are now in [place] at [place] home. Pharm is [facility] Santiago Glad asked that she be notify.

## 2018-06-12 DIAGNOSIS — Z08 Encounter for follow-up examination after completed treatment for malignant neoplasm: Secondary | ICD-10-CM | POA: Diagnosis not present

## 2018-06-12 DIAGNOSIS — Z87891 Personal history of nicotine dependence: Secondary | ICD-10-CM | POA: Diagnosis not present

## 2018-06-12 DIAGNOSIS — C154 Malignant neoplasm of middle third of esophagus: Secondary | ICD-10-CM | POA: Diagnosis not present

## 2018-06-12 DIAGNOSIS — I700 Atherosclerosis of aorta: Secondary | ICD-10-CM | POA: Diagnosis not present

## 2018-06-12 DIAGNOSIS — J439 Emphysema, unspecified: Secondary | ICD-10-CM | POA: Diagnosis not present

## 2018-06-12 DIAGNOSIS — Z923 Personal history of irradiation: Secondary | ICD-10-CM | POA: Diagnosis not present

## 2018-06-12 DIAGNOSIS — N201 Calculus of ureter: Secondary | ICD-10-CM | POA: Diagnosis not present

## 2018-06-23 DIAGNOSIS — I480 Paroxysmal atrial fibrillation: Secondary | ICD-10-CM | POA: Diagnosis not present

## 2018-06-23 DIAGNOSIS — C154 Malignant neoplasm of middle third of esophagus: Secondary | ICD-10-CM | POA: Diagnosis not present

## 2018-06-23 DIAGNOSIS — I4892 Unspecified atrial flutter: Secondary | ICD-10-CM | POA: Diagnosis not present

## 2018-06-23 DIAGNOSIS — I10 Essential (primary) hypertension: Secondary | ICD-10-CM | POA: Diagnosis not present

## 2018-06-23 DIAGNOSIS — I2510 Atherosclerotic heart disease of native coronary artery without angina pectoris: Secondary | ICD-10-CM | POA: Diagnosis not present

## 2018-06-23 DIAGNOSIS — Z7901 Long term (current) use of anticoagulants: Secondary | ICD-10-CM | POA: Diagnosis not present

## 2018-06-29 DIAGNOSIS — Z01818 Encounter for other preprocedural examination: Secondary | ICD-10-CM | POA: Diagnosis not present

## 2018-06-29 DIAGNOSIS — Z1159 Encounter for screening for other viral diseases: Secondary | ICD-10-CM | POA: Diagnosis not present

## 2018-06-29 DIAGNOSIS — C154 Malignant neoplasm of middle third of esophagus: Secondary | ICD-10-CM | POA: Diagnosis not present

## 2018-06-30 DIAGNOSIS — Z87891 Personal history of nicotine dependence: Secondary | ICD-10-CM | POA: Diagnosis not present

## 2018-06-30 DIAGNOSIS — C154 Malignant neoplasm of middle third of esophagus: Secondary | ICD-10-CM | POA: Diagnosis not present

## 2018-06-30 DIAGNOSIS — I2510 Atherosclerotic heart disease of native coronary artery without angina pectoris: Secondary | ICD-10-CM | POA: Diagnosis not present

## 2018-06-30 DIAGNOSIS — Z79899 Other long term (current) drug therapy: Secondary | ICD-10-CM | POA: Diagnosis not present

## 2018-06-30 DIAGNOSIS — C158 Malignant neoplasm of overlapping sites of esophagus: Secondary | ICD-10-CM | POA: Diagnosis not present

## 2018-06-30 DIAGNOSIS — C159 Malignant neoplasm of esophagus, unspecified: Secondary | ICD-10-CM | POA: Diagnosis not present

## 2018-06-30 DIAGNOSIS — Z85828 Personal history of other malignant neoplasm of skin: Secondary | ICD-10-CM | POA: Diagnosis not present

## 2018-06-30 DIAGNOSIS — Z923 Personal history of irradiation: Secondary | ICD-10-CM | POA: Diagnosis not present

## 2018-06-30 DIAGNOSIS — I10 Essential (primary) hypertension: Secondary | ICD-10-CM | POA: Diagnosis not present

## 2018-06-30 DIAGNOSIS — D61818 Other pancytopenia: Secondary | ICD-10-CM | POA: Diagnosis not present

## 2018-06-30 DIAGNOSIS — I4892 Unspecified atrial flutter: Secondary | ICD-10-CM | POA: Diagnosis not present

## 2018-06-30 DIAGNOSIS — N400 Enlarged prostate without lower urinary tract symptoms: Secondary | ICD-10-CM | POA: Diagnosis not present

## 2018-07-03 ENCOUNTER — Other Ambulatory Visit: Payer: Self-pay

## 2018-07-03 ENCOUNTER — Encounter: Payer: Self-pay | Admitting: Psychiatry

## 2018-07-03 ENCOUNTER — Ambulatory Visit (INDEPENDENT_AMBULATORY_CARE_PROVIDER_SITE_OTHER): Payer: Medicare Other | Admitting: Psychiatry

## 2018-07-03 DIAGNOSIS — F331 Major depressive disorder, recurrent, moderate: Secondary | ICD-10-CM

## 2018-07-03 DIAGNOSIS — F900 Attention-deficit hyperactivity disorder, predominantly inattentive type: Secondary | ICD-10-CM | POA: Diagnosis not present

## 2018-07-03 DIAGNOSIS — F411 Generalized anxiety disorder: Secondary | ICD-10-CM

## 2018-07-03 MED ORDER — ARIPIPRAZOLE 2 MG PO TABS: 2.0000 mg | ORAL_TABLET | Freq: Every day | ORAL | 1 refills | Status: DC

## 2018-07-03 NOTE — Progress Notes (Signed)
Colin Villa [id number] January 26, 1940 78 y.o.   Virtual Visit via Telephone Note  I connected with pt by telephone and verified that I am speaking with the correct person using two identifiers.   I discussed the limitations, risks, security and privacy concerns of performing an evaluation and management service by telephone and the availability of in person appointments. I also discussed with the patient that there may be a patient responsible charge related to this service. The patient expressed understanding and agreed to proceed.  I discussed the assessment and treatment plan with the patient. The patient was provided an opportunity to ask questions and all were answered. The patient agreed with the plan and demonstrated an understanding of the instructions.   The patient was advised to call back or seek an in-person evaluation if the symptoms worsen or if the condition fails to improve as anticipated.  I provided 25 minutes of non-face-to-face time during this encounter. The call started at one third and ended at 155. [place] the provider was located office.   Subjective:   Patient ID:  Colin Villa is a 78 y.o. (DOB 12/18/1940) male.  Chief Complaint:  Chief Complaint  Patient presents with  . Depression  . Anxiety  . Follow-up    med management    HPI Colin Villa presents to the office today for follow-up of chronic anxiety and marital stress.  Last visit November 2019.  No meds were changed.  Since that appointment he was diagnosed with esophageal cancer.  He called in between appointments with more depression and anxiety and mirtazapine 30 mg nightly was added because he was on the maximum dosage of paroxetine.  Still dealing with CA tx at [facility] + heart problems.  Had counseling at church.  Counselor recommended he see Donnal Moat, PA.  Disc lack of clinical contact for 9 mos is more likely cause of lack of progress.    Took  mirtazapine for 2 weeks and stopped it bc reasons that aren't clear.  Not sure why he stopped.  Now sleep is OK.  No intrusive images or NM like before.  Sadness hangs on me.  Sleep at least 7 hours or more.  A lot of anxiety and a lot of anger.  More anger now.  F had anger outbursts when he was a kid also.    His wife chronically complains about him.  Do to chronic marital problems he lives off  And on with wife or son.  D says he should not live with  Wife.  His wife alleges that he has dementia.  Does not allege any physical violence.  They do continue to argue.  He continues to stay separate from wife at this time bc arguing with wife again. Appetite fair.  Weight stable.  Conc, energy, interest, enjoyment all down.  No SI   Taking modafinil accidentally at night did not interfere with sleep.  Switched it back to the am.  Not sure how well it is working for daytime alertness and focus. Not sleepy daytime but less energy mid afternoon.    No accidents.  Got lost once going to a school that he had not been to before.  No concerns about his driving.  No driving tickets.  No fender benders.  Says memory is not impaired more than others at 78 yo.  Drinking less than 2 beers once or twice a week.  Prior psychiatric medication trials  include Wellbutrin 400 mg a day, olanzapine, buspirone, Ritalin, lithium 900 mg daily, Depakote, and Strattera.  Review of Systems:  Review of Systems  Gastrointestinal: Positive for abdominal pain.  Neurological: Negative for tremors and weakness.  Psychiatric/Behavioral: Negative for agitation, behavioral problems, confusion, decreased concentration, dysphoric mood, hallucinations, self-injury, sleep disturbance and suicidal ideas. The patient is nervous/anxious. The patient is not hyperactive.    Sees PCP tomorrow. Medications: I have reviewed the patient's current medications.  Current Outpatient Medications  Medication Sig Dispense Refill  . amLODipine  (NORVASC) 5 MG tablet Take 5 mg by mouth daily.     . carvedilol (COREG) 3.125 MG tablet TAKE 1 TABLET BY MOUTH TWICE DAILY.(KEEP APPOINTMENT IN NOV FOR REFILLS). 60 tablet 1  . clonazePAM (KLONOPIN) 0.5 MG tablet Take 0.5 mg by mouth 2 (two) times daily.    . cloNIDine (CATAPRES) 0.1 MG tablet Take 0.1 mg by mouth 2 (two) times daily.     Marland Kitchen PARoxetine (PAXIL) 30 MG tablet Take 2 tablets (60 mg total) by mouth daily. 180 tablet 1  . rosuvastatin (CRESTOR) 40 MG tablet TAKE 1 TABLET BY MOUTH DAILY 90 tablet 2  . tamsulosin (FLOMAX) 0.4 MG CAPS capsule Take 0.4 mg by mouth daily.   11  . ARIPiprazole (ABILIFY) 2 MG tablet Take 1 tablet (2 mg total) by mouth daily. 30 tablet 1  . carvedilol (COREG) 3.125 MG tablet TAKE 1 TABLET(3.125 MG) BY MOUTH TWICE DAILY 60 tablet 11  . mirtazapine (REMERON) 30 MG tablet Take 1 tablet (30 mg total) by mouth at bedtime. (Patient not taking: Reported on 07/03/2018) 30 tablet 1  . modafinil (PROVIGIL) 200 MG tablet Take 200 mg by mouth daily.     No current facility-administered medications for this visit.     Medication Side Effects: None, no sleepiness.  Allergies:  Allergies  Allergen Reactions  . Sulfa Antibiotics Other (See Comments)    Childhood allergy    Past Medical History:  Diagnosis Date  . ADHD   . BPH (benign prostatic hyperplasia)   . Colonic polyp   . Depression   . Elevated fasting glucose   . Elevated PSA   . Essential hypertension, benign   . Hypertension   . Kidney stones   . Mixed dyslipidemia   . Osteoarthritis    Especially of the left kneee which end stage     Family History  Problem Relation Age of Onset  . Heart failure Mother   . Melanoma Mother   . Mental illness Mother   . Heart failure Father   . Cancer Brother        bladder  . Diabetes Brother     Social History   Socioeconomic History  . Marital status: Married    Spouse name: Colin Villa  . Number of children: 2  . Years of education: 88  . Highest  education level: Not on file  Occupational History    Comment: retired  Scientific laboratory technician  . Financial resource strain: Not on file  . Food insecurity    Worry: Not on file    Inability: Not on file  . Transportation needs    Medical: Not on file    Non-medical: Not on file  Tobacco Use  . Smoking status: Former Smoker    Quit date: 01/27/1996    Years since quitting: 22.4  . Smokeless tobacco: Never Used  Substance and Sexual Activity  . Alcohol use: Yes    Comment: 4 beers weekly  .  Drug use: No  . Sexual activity: Not on file  Lifestyle  . Physical activity    Days per week: Not on file    Minutes per session: Not on file  . Stress: Not on file  Relationships  . Social Herbalist on phone: Not on file    Gets together: Not on file    Attends religious service: Not on file    Active member of club or organization: Not on file    Attends meetings of clubs or organizations: Not on file    Relationship status: Not on file  . Intimate partner violence    Fear of current or ex partner: Not on file    Emotionally abused: Not on file    Physically abused: Not on file    Forced sexual activity: Not on file  Other Topics Concern  . Not on file  Social History Narrative  . Not on file    Past Medical History, Surgical history, Social history, and Family history were reviewed and updated as appropriate.   Please see review of systems for further details on the patient's review from today.   Objective:   Physical Exam:  There were no vitals taken for this visit.  Physical Exam Neurological:     Mental Status: He is alert and oriented to person, place, and time.     Cranial Nerves: No dysarthria.  Psychiatric:        Attention and Perception: Attention normal.        Mood and Affect: Mood is anxious and depressed.        Speech: Speech normal.        Behavior: Behavior is cooperative.        Thought Content: Thought content normal. Thought content is not  paranoid or delusional. Thought content does not include homicidal or suicidal ideation. Thought content does not include homicidal or suicidal plan.        Cognition and Memory: Cognition and memory normal.        Judgment: Judgment normal.     Comments: Insight fair.     Lab Review:     Component Value Date/Time   NA 137 08/10/2016 1202   K 4.1 08/10/2016 1202   CL 106 08/10/2016 1202   CO2 24 08/10/2016 1202   GLUCOSE 138 (H) 08/10/2016 1202   BUN 9 08/10/2016 1202   CREATININE 0.90 08/10/2016 1202   CALCIUM 9.1 08/10/2016 1202   PROT 6.6 12/06/2016 0812   ALBUMIN 4.4 12/06/2016 0812   AST 24 12/06/2016 0812   ALT 21 12/06/2016 0812   ALKPHOS 77 12/06/2016 0812   BILITOT 0.6 12/06/2016 0812   GFRNONAA >60 08/10/2016 1202   GFRAA >60 08/10/2016 1202       Component Value Date/Time   WBC 6.3 08/10/2016 1202   RBC 4.31 08/10/2016 1202   HGB 14.3 08/10/2016 1202   HCT 39.2 08/10/2016 1202   PLT 114 (L) 08/10/2016 1202   MCV 91.0 08/10/2016 1202   MCH 33.2 08/10/2016 1202   MCHC 36.5 (H) 08/10/2016 1202   RDW 13.4 08/10/2016 1202    No results found for: POCLITH, LITHIUM   No results found for: PHENYTOIN, PHENOBARB, VALPROATE, CBMZ   .res Assessment: Plan:    Kielan was seen today for depression, anxiety and follow-up.  Diagnoses and all orders for this visit:  Major depressive disorder, recurrent episode, moderate (HCC) -     ARIPiprazole (ABILIFY) 2 MG tablet; Take  1 tablet (2 mg total) by mouth daily.  Generalized anxiety disorder  Attention deficit hyperactivity disorder (ADHD), predominantly inattentive type   More depressed and anxious DT cancer and heart problems.  Option restart mirtazapine vs potentiate with Abilify.  He feels more desperate.  Abilify could benefit in a week so he wants to do that.  Discussed potential metabolic side effects associated with atypical antipsychotics, as well as potential risk for movement side effects. Advised pt to  contact office if movement side effects occur.    Consider mirtazapine if needed. Hold modafinil for now.  We discussed the short-term risks associated with benzodiazepines including sedation and increased fall risk among others.  Discussed long-term side effect risk including dependence, potential withdrawal symptoms, and the potential eventual dose-related risk of dementia.  Recommend reducing the am dosage of clonazepam to help protect memory.  Try to reduce the morning clonazepam to 1/2 tablet in the morning.  Be careful about any alcohol with the clonazepam.  Disc high dosage paroxetine.  He tolerates it and benefits.  He remains under a lot of stress.  Supportive and problem solving therapy for cancer and chronic marital problems and depression.  Could benefit from counseling again.  FU 6 weeks  This appt was 30 mins.  Lynder Parents, MD, DFAPA   No future appointments.  No orders of the defined types were placed in this encounter.     -------------------------------

## 2018-07-14 ENCOUNTER — Telehealth: Payer: Self-pay | Admitting: Psychiatry

## 2018-07-14 NOTE — Telephone Encounter (Signed)
Patient clled and said that he needs refill on his klonopin 0.5 mg 2 x daily to be sent to the [facility] in [place] on [address]

## 2018-07-15 ENCOUNTER — Other Ambulatory Visit: Payer: Self-pay

## 2018-07-15 MED ORDER — CLONAZEPAM 0.5 MG PO TABS: 0.5000 mg | ORAL_TABLET | Freq: Two times a day (BID) | ORAL | 0 refills | Status: DC | PRN

## 2018-07-15 NOTE — Telephone Encounter (Signed)
Last fill 10/2017 #180

## 2018-07-15 NOTE — Telephone Encounter (Signed)
Refill called in to pharmacy

## 2018-07-16 DIAGNOSIS — E782 Mixed hyperlipidemia: Secondary | ICD-10-CM | POA: Diagnosis not present

## 2018-07-16 DIAGNOSIS — R972 Elevated prostate specific antigen [PSA]: Secondary | ICD-10-CM | POA: Diagnosis not present

## 2018-07-16 DIAGNOSIS — Z8501 Personal history of malignant neoplasm of esophagus: Secondary | ICD-10-CM | POA: Diagnosis not present

## 2018-07-16 DIAGNOSIS — R7301 Impaired fasting glucose: Secondary | ICD-10-CM | POA: Diagnosis not present

## 2018-07-16 DIAGNOSIS — F338 Other recurrent depressive disorders: Secondary | ICD-10-CM | POA: Diagnosis not present

## 2018-07-16 DIAGNOSIS — N401 Enlarged prostate with lower urinary tract symptoms: Secondary | ICD-10-CM | POA: Diagnosis not present

## 2018-07-16 DIAGNOSIS — I4891 Unspecified atrial fibrillation: Secondary | ICD-10-CM | POA: Diagnosis not present

## 2018-07-16 DIAGNOSIS — I10 Essential (primary) hypertension: Secondary | ICD-10-CM | POA: Diagnosis not present

## 2018-08-19 ENCOUNTER — Ambulatory Visit: Payer: Medicare Other | Admitting: Psychiatry

## 2018-08-21 ENCOUNTER — Other Ambulatory Visit: Payer: Self-pay | Admitting: Psychiatry

## 2018-08-21 DIAGNOSIS — F331 Major depressive disorder, recurrent, moderate: Secondary | ICD-10-CM

## 2018-09-03 ENCOUNTER — Ambulatory Visit (INDEPENDENT_AMBULATORY_CARE_PROVIDER_SITE_OTHER): Payer: Medicare Other | Admitting: Psychiatry

## 2018-09-03 ENCOUNTER — Other Ambulatory Visit: Payer: Self-pay

## 2018-09-03 DIAGNOSIS — F411 Generalized anxiety disorder: Secondary | ICD-10-CM

## 2018-09-03 DIAGNOSIS — F900 Attention-deficit hyperactivity disorder, predominantly inattentive type: Secondary | ICD-10-CM

## 2018-09-03 DIAGNOSIS — Z630 Problems in relationship with spouse or partner: Secondary | ICD-10-CM

## 2018-09-03 NOTE — Progress Notes (Signed)
PROBLEM-FOCUSED INITIAL PSYCHOTHERAPY EVALUATION Colin Moore, PhD LP [facility]  Name: Colin Villa Date: 09/03/2018 Time spent: 60 min MRN: [id number] DOB: April 18, 1940 Guardian/Payee: self  PCP: Hulan Fess, MD Documentation requested on this visit: No  PROBLEM HISTORY Reason for Visit /Presenting Problem:  Chief Complaint  Patient presents with  . Establish Care  . Family Problem  Seen with wife, Colin Villa.  Narrative/History of Present Illness Former PT returning for marital problems and new effort to deal with his own anger.  Have continued to have conflict over time -- similar to when first known over 15 years ago -- been in a pattern of living together and going away.  PT confesses that his own anger reactions have been a central part of the problem a long time.  Recent history of walking off long periods, sequestering himself in resentment.  Built a 2nd (3rd) home in [place], completed after 20 years of dreaming, and then esophageal cancer found last fall.  2 spots, treated effectively at [facility], clear now.  [facility] psychosocial assessment for cancer treatment was helpful for unearthing stress, and PT made a considered decision to let [employer], who live in [place], be his accompaniment to treatment, not wife.  It was hard on Kay to be shut out, but she tried to abide, eventually did some digging at the hospital when she saw on his MyChart that he had a visit to the ICU.  Son Colin Villa was particularly protective during active treatment, pressing his mother to stay out of it, ostensibly both because PT wanted it that way and because family history holds that she has been frankly intrusive and provocative in the past.  Re. anger, wife says he has history of outbursts without memory of it, has led to a neighbor making a domestic call before.  PT himself admits to having used every vulgar name on the planet with her in the heat of arguments.  Have agreed together that  in 55 years together they have missed many years of peace and happiness that might have been possible and want to restore.  His confession recently of an anger problem means a lot to [place], clearly softened by it.  Affirms she has seen him be more controlled over anger expression and making shorter, calmer work of irritations.  Colin Villa, for her part, is trying to do better about letting up overtalking, let him walk when he needs to, which PT affirms.  Also they do find moments where humor can help, especially when he is free to make a joke on himself, or her.  Prior Psychiatric Assessment/Treatment:   Outpatient treatment: long history with this office -- Dr. Clovis Pu, prior episodes with [facility] hospitalization: see paper chart  Psychological assessment/testing: none stated   Abuse History: Victim of abuse: none stated.   Victim of neglect: none stated.   Perpetrator of abuse/neglect: No.   Witness / Exposure to Domestic Violence: No.   Witness to [employer] Violence:  No.   Protective Services Involvement: No.   Report needed: No.    Substance Abuse History: Current substance abuse: No.   History of impactful substance use/abuse: Not assessed at this time / none suspected.     FAMILY/SOCIAL HISTORY Family of origin Assessment deferred.  Mention made of mother being paranoid schizophrenic -- ostensibly helped set up sensitivity to male emotionality as well as attraction to emotionally expressive, potentially volatile mate (traits of Colin Villa alleged in times past). Family of intention/current living situation  PT lives with their spouse, with living conditions reported as compatible, peaceful.  Grown son in [place], grown daughter.  Son separated, with kids. 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  Recent successful treatment of esophageal cancer as above. Past Medical History:  Diagnosis Date  . ADHD   . BPH (benign prostatic hyperplasia)   . Colonic polyp   . Depression   . Elevated fasting glucose   . Elevated PSA   . Essential hypertension, benign   . Hypertension   . Kidney stones   . Mixed dyslipidemia   . Osteoarthritis    Especially of the left kneee which end stage      Past Surgical History:  Procedure Laterality Date  . COLONOSCOPY    . CORONARY ANGIOGRAPHY  1998  . KNEE SURGERY  1959   medial collateral ligament  . LEFT HEART CATH AND CORONARY ANGIOGRAPHY N/A 08/10/2016   Procedure: LEFT HEART CATH AND CORONARY ANGIOGRAPHY;  Surgeon: Troy Sine, MD;  Location: [facility];  Service: Cardiovascular;  Laterality: N/A;  . LITHOTRIPSY  1997  . REPLACEMENT TOTAL KNEE Left 2010  . THORACIC AORTOGRAM N/A 08/10/2016   Procedure: Thoracic Aortogram;  Surgeon: Troy Sine, MD;  Location: [facility];  Service: Cardiovascular;  Laterality: N/A;    Allergies  Allergen Reactions  . Sulfa Antibiotics Other (See Comments)    Childhood allergy    Medications (as listed in Epic): Current Outpatient Medications  Medication Sig Dispense Refill  . amLODipine (NORVASC) 5 MG tablet Take 5 mg by mouth daily.     . ARIPiprazole (ABILIFY) 2 MG tablet TAKE 1 TABLET BY MOUTH EVERY DAY 30 tablet 1  . carvedilol (COREG) 3.125 MG tablet TAKE 1 TABLET BY MOUTH TWICE DAILY.(KEEP APPOINTMENT IN NOV FOR REFILLS). 60 tablet 1  . carvedilol (COREG) 3.125 MG tablet TAKE 1 TABLET(3.125 MG) BY MOUTH TWICE DAILY 60 tablet 11  . clonazePAM (KLONOPIN) 0.5  MG tablet Take 1 tablet (0.5 mg total) by mouth 2 (two) times daily as needed for anxiety. 60 tablet 0  . cloNIDine (CATAPRES) 0.1 MG tablet Take 0.1 mg by mouth 2 (two) times daily.     . mirtazapine (REMERON) 30 MG tablet Take 1 tablet (30 mg total) by mouth at bedtime. (Patient not taking: Reported on 07/03/2018) 30 tablet 1  . modafinil (PROVIGIL) 200 MG tablet Take 200 mg by mouth daily.    Marland Kitchen PARoxetine (PAXIL) 30 MG tablet Take 2 tablets (60 mg total) by mouth daily. 180 tablet 1  . rosuvastatin (CRESTOR) 40 MG tablet TAKE 1 TABLET BY MOUTH DAILY 90 tablet 2  . tamsulosin (FLOMAX) 0.4 MG CAPS capsule Take 0.4 mg by mouth daily.   11   No current facility-administered medications for this visit.     MENTAL STATUS AND OBSERVATIONS Appearance:   Casual and Neat     Behavior:  Appropriate  Motor:  Normal  Speech/Language:   Clear and Coherent  Affect:  Appropriate  Mood:  euthymic  Thought process:  normal  Thought content:  WNL  Sensory/Perceptual disturbances:    WNL  Orientation:  grossly intact  Attention:  Good  Concentration:  Good  Memory:  grossly intact  Fund of knowledge:   Good  Insight:    Good  Judgment:   Good  Impulse Control:  Fair  Initial Risk Assessment: Danger to Self: No Self-injurious Behavior: No Danger to Others: No Physical Aggression / Violence: No Duty to Warn: No Access to Firearms a concern: No Gang Involvement: No Patient / guardian was educated about steps to take if suicide or homicide risk level increases between visits: yes . While future psychiatric events cannot be accurately predicted, the patient does not currently require acute inpatient psychiatric care and does not currently meet [facility] involuntary commitment criteria.   DIAGNOSIS:    ICD-10-CM   1. Major depressive disorder, recurrent episode, moderate (HCC)  F33.1   2. Generalized anxiety disorder  F41.1   3. Attention deficit hyperactivity disorder (ADHD), predominantly  inattentive type  F90.0     INITIAL TREATMENT: . Ethical orientation and verbal consents to o privacy rights including, but not limited to, HIPAA provisions and any questions, EMR and use of e-PHI o patient responsibilities, including scheduling and fair notice of changes, method of visit options and regulatory and financial conditions affecting them o expectations for working relationship in psychotherapy o expectations and consents for working partnerships with other health care disciplines, especially including medication and other behavioral health providers . Rapport confirmed . Affirmed constructive actions each has made . Managed conversation process to hold down interruptions known from history to trigger PT's anger, though a number of interruptions still observed (no ill will or conflict implied).  Clear that managing turn-taking and process will be very important.  Interpreted that the foremost issue in most couples' conflict is the process of how they communicate, much more than attitudes, per se, so it will be important for them to ally and be quick to recognize when neither of them is the bad guy or the good guy but the process, and their own reflexes how to handle discomfort, are the actual adversary. . Estimated outlook for therapy, including availability of telehealth if health, safety, or travel conditions compromise ability to attend in person.  Will continue jointly in couple-focused counseling. . Forecast conflict triggers upcoming -- maybe dealing with [employer] house, which will need to be sold before too long but will be a travel spot for them for the present.  Typical pressure that Colin Villa may want him to help her do things in the place when he just wants to read, relax, and PT knows he can either guard his relaxation jealously or say yes to do something and then not show up.  Encouraged to be as good as his word, if he is going to give it, for self-respect's sake as well as constructive  effort to the marriage. . Wife mentions PT's schizophrenic mother as a formative influence -- advised go light on psychological analysis, prioritize building healthy habits of conflict management and alert courtesy to each other's moods . Behavioral challenges -- PT try to be clear about his yesses and nos, practice OK to say no or not yet, openly; W try to offer, if she sees him tensing up, "Do you need a time out?" . Used humor from their youth (Popeye cartoon reference) to recruit sense of play and therapeutic self-mockery  Plan: . Continue solutions in practice for conflict -- granting grace, allowing timeouts, humor/self-mockery where possible . Prioritize  control of the process when conflict rises, especially mutual alliance and willingness to take a timeout before it gets furious . Maintain medication as prescribed and work faithfully with relevant prescriber(s) if any changes are desired or seem indicated . Call the clinic on-call service, present to ER, or call 911 if any life-threatening psychiatric crisis Return in about 2 weeks (around 09/17/2018).  [name], PhD  Colin Moore, PhD LP Clinical Psychologist, [facility] [address] [place], [place] [address] [phone]

## 2018-09-09 DIAGNOSIS — Z20828 Contact with and (suspected) exposure to other viral communicable diseases: Secondary | ICD-10-CM | POA: Diagnosis not present

## 2018-09-09 DIAGNOSIS — Z01812 Encounter for preprocedural laboratory examination: Secondary | ICD-10-CM | POA: Diagnosis not present

## 2018-09-10 ENCOUNTER — Other Ambulatory Visit: Payer: Self-pay

## 2018-09-10 ENCOUNTER — Telehealth: Payer: Self-pay | Admitting: Psychiatry

## 2018-09-10 MED ORDER — CLONAZEPAM 0.5 MG PO TABS: 0.5000 mg | ORAL_TABLET | Freq: Two times a day (BID) | ORAL | 0 refills | Status: DC | PRN

## 2018-09-10 MED ORDER — PAROXETINE HCL 30 MG PO TABS: 60.0000 mg | ORAL_TABLET | Freq: Every day | ORAL | 0 refills | Status: DC

## 2018-09-10 NOTE — Telephone Encounter (Signed)
Colin Villa called to request refill of his paroxetine and clonazepam.  He is no longer using [facility].  Please send to [facility] on [address].  Next appt 10/08/18

## 2018-09-10 NOTE — Telephone Encounter (Signed)
Refills sent per request.

## 2018-09-11 DIAGNOSIS — I2510 Atherosclerotic heart disease of native coronary artery without angina pectoris: Secondary | ICD-10-CM | POA: Diagnosis not present

## 2018-09-11 DIAGNOSIS — R918 Other nonspecific abnormal finding of lung field: Secondary | ICD-10-CM | POA: Diagnosis not present

## 2018-09-11 DIAGNOSIS — I10 Essential (primary) hypertension: Secondary | ICD-10-CM | POA: Diagnosis not present

## 2018-09-11 DIAGNOSIS — I708 Atherosclerosis of other arteries: Secondary | ICD-10-CM | POA: Diagnosis not present

## 2018-09-11 DIAGNOSIS — Z9221 Personal history of antineoplastic chemotherapy: Secondary | ICD-10-CM | POA: Diagnosis not present

## 2018-09-11 DIAGNOSIS — Z87891 Personal history of nicotine dependence: Secondary | ICD-10-CM | POA: Diagnosis not present

## 2018-09-11 DIAGNOSIS — Z923 Personal history of irradiation: Secondary | ICD-10-CM | POA: Diagnosis not present

## 2018-09-11 DIAGNOSIS — I4892 Unspecified atrial flutter: Secondary | ICD-10-CM | POA: Diagnosis not present

## 2018-09-11 DIAGNOSIS — Z23 Encounter for immunization: Secondary | ICD-10-CM | POA: Diagnosis not present

## 2018-09-11 DIAGNOSIS — C155 Malignant neoplasm of lower third of esophagus: Secondary | ICD-10-CM | POA: Diagnosis not present

## 2018-09-11 DIAGNOSIS — C154 Malignant neoplasm of middle third of esophagus: Secondary | ICD-10-CM | POA: Diagnosis not present

## 2018-09-12 DIAGNOSIS — I4892 Unspecified atrial flutter: Secondary | ICD-10-CM | POA: Diagnosis not present

## 2018-09-12 DIAGNOSIS — Z882 Allergy status to sulfonamides status: Secondary | ICD-10-CM | POA: Diagnosis not present

## 2018-09-12 DIAGNOSIS — F329 Major depressive disorder, single episode, unspecified: Secondary | ICD-10-CM | POA: Diagnosis not present

## 2018-09-12 DIAGNOSIS — Z79899 Other long term (current) drug therapy: Secondary | ICD-10-CM | POA: Diagnosis not present

## 2018-09-12 DIAGNOSIS — I10 Essential (primary) hypertension: Secondary | ICD-10-CM | POA: Diagnosis not present

## 2018-09-12 DIAGNOSIS — I480 Paroxysmal atrial fibrillation: Secondary | ICD-10-CM | POA: Diagnosis not present

## 2018-09-12 DIAGNOSIS — Z923 Personal history of irradiation: Secondary | ICD-10-CM | POA: Diagnosis not present

## 2018-09-12 DIAGNOSIS — N400 Enlarged prostate without lower urinary tract symptoms: Secondary | ICD-10-CM | POA: Diagnosis not present

## 2018-09-12 DIAGNOSIS — Z9221 Personal history of antineoplastic chemotherapy: Secondary | ICD-10-CM | POA: Diagnosis not present

## 2018-09-12 DIAGNOSIS — Z87442 Personal history of urinary calculi: Secondary | ICD-10-CM | POA: Diagnosis not present

## 2018-09-12 DIAGNOSIS — Z9889 Other specified postprocedural states: Secondary | ICD-10-CM | POA: Diagnosis not present

## 2018-09-12 DIAGNOSIS — Z85828 Personal history of other malignant neoplasm of skin: Secondary | ICD-10-CM | POA: Diagnosis not present

## 2018-09-12 DIAGNOSIS — K228 Other specified diseases of esophagus: Secondary | ICD-10-CM | POA: Diagnosis not present

## 2018-09-12 DIAGNOSIS — I2510 Atherosclerotic heart disease of native coronary artery without angina pectoris: Secondary | ICD-10-CM | POA: Diagnosis not present

## 2018-09-12 DIAGNOSIS — C155 Malignant neoplasm of lower third of esophagus: Secondary | ICD-10-CM | POA: Diagnosis not present

## 2018-09-12 DIAGNOSIS — Z87891 Personal history of nicotine dependence: Secondary | ICD-10-CM | POA: Diagnosis not present

## 2018-09-12 DIAGNOSIS — Z7901 Long term (current) use of anticoagulants: Secondary | ICD-10-CM | POA: Diagnosis not present

## 2018-09-12 DIAGNOSIS — C154 Malignant neoplasm of middle third of esophagus: Secondary | ICD-10-CM | POA: Diagnosis not present

## 2018-09-12 DIAGNOSIS — F419 Anxiety disorder, unspecified: Secondary | ICD-10-CM | POA: Diagnosis not present

## 2018-09-12 DIAGNOSIS — Z808 Family history of malignant neoplasm of other organs or systems: Secondary | ICD-10-CM | POA: Diagnosis not present

## 2018-09-12 DIAGNOSIS — Z8249 Family history of ischemic heart disease and other diseases of the circulatory system: Secondary | ICD-10-CM | POA: Diagnosis not present

## 2018-09-12 DIAGNOSIS — E785 Hyperlipidemia, unspecified: Secondary | ICD-10-CM | POA: Diagnosis not present

## 2018-09-12 DIAGNOSIS — Z8052 Family history of malignant neoplasm of bladder: Secondary | ICD-10-CM | POA: Diagnosis not present

## 2018-09-19 ENCOUNTER — Other Ambulatory Visit: Payer: Self-pay | Admitting: Psychiatry

## 2018-10-01 DIAGNOSIS — Z85828 Personal history of other malignant neoplasm of skin: Secondary | ICD-10-CM | POA: Diagnosis not present

## 2018-10-01 DIAGNOSIS — L814 Other melanin hyperpigmentation: Secondary | ICD-10-CM | POA: Diagnosis not present

## 2018-10-01 DIAGNOSIS — D2261 Melanocytic nevi of right upper limb, including shoulder: Secondary | ICD-10-CM | POA: Diagnosis not present

## 2018-10-01 DIAGNOSIS — L859 Epidermal thickening, unspecified: Secondary | ICD-10-CM | POA: Diagnosis not present

## 2018-10-01 DIAGNOSIS — D485 Neoplasm of uncertain behavior of skin: Secondary | ICD-10-CM | POA: Diagnosis not present

## 2018-10-01 DIAGNOSIS — L570 Actinic keratosis: Secondary | ICD-10-CM | POA: Diagnosis not present

## 2018-10-01 DIAGNOSIS — L853 Xerosis cutis: Secondary | ICD-10-CM | POA: Diagnosis not present

## 2018-10-01 DIAGNOSIS — L821 Other seborrheic keratosis: Secondary | ICD-10-CM | POA: Diagnosis not present

## 2018-10-01 DIAGNOSIS — L218 Other seborrheic dermatitis: Secondary | ICD-10-CM | POA: Diagnosis not present

## 2018-10-01 DIAGNOSIS — D692 Other nonthrombocytopenic purpura: Secondary | ICD-10-CM | POA: Diagnosis not present

## 2018-10-01 DIAGNOSIS — D1801 Hemangioma of skin and subcutaneous tissue: Secondary | ICD-10-CM | POA: Diagnosis not present

## 2018-10-07 ENCOUNTER — Other Ambulatory Visit: Payer: Self-pay

## 2018-10-07 ENCOUNTER — Ambulatory Visit (INDEPENDENT_AMBULATORY_CARE_PROVIDER_SITE_OTHER): Payer: Medicare Other | Admitting: Psychiatry

## 2018-10-07 DIAGNOSIS — Z630 Problems in relationship with spouse or partner: Secondary | ICD-10-CM

## 2018-10-07 DIAGNOSIS — F900 Attention-deficit hyperactivity disorder, predominantly inattentive type: Secondary | ICD-10-CM | POA: Diagnosis not present

## 2018-10-07 DIAGNOSIS — F411 Generalized anxiety disorder: Secondary | ICD-10-CM | POA: Diagnosis not present

## 2018-10-07 DIAGNOSIS — F331 Major depressive disorder, recurrent, moderate: Secondary | ICD-10-CM

## 2018-10-07 NOTE — Progress Notes (Signed)
Psychotherapy Progress Note [facility] Colin Moore, PhD LP  Patient ID: Colin Villa     MRN: [id number]     Therapy format: Individual psychotherapy Date: 10/07/2018     Start: 11:23a Stop: 12:13p Time Spent: 50 min Location: in-person   Session narrative (presenting needs, interim history, self-report of stressors and symptoms, applications of prior therapy, status changes, and interventions made in session) Initial complaint -- perennial -- that Colin Villa keeps changing plans, directions, careens from urgency to urgency, but -- at least as perceived -- a lot of should-have commentary, back-seat driving, run-on talking, and overincorporating conversations, often enough with implied obligations for him.  Last night, came to her ordering him out of the house and rescinding it.    Allowed to vent, redirected to .  Agreed W can monologue with the best of them, acknowledged that she will set  contradictory expectations, chronically sets herself up to be harried longsuffering victim, and sometimes grudges again on his remote history of infidelity, all of which can be stifling and frustrating.  Pointed out PT's expressed interest in therapy for anger management, pointed out his Achilles heel is in making himself responsible to catch and do something with much too much information and implied responsibilities.  Image offered of trying to catch a river in a Dixie cup, alternative to let the water flow by except what the cup can actually hold.  Framed focal need, both for conflict and for his anger modulation, to change the pattern they get into by brokering a real freedom to pause, with the express purpose of calming down enough, or comprehending enough, either to be of use or at least to prevent overheating himself emotionally.  Outlined a procedure, to be agreed with W, for him to call a time-out, explain PRN that he is trying to (a) cool, (b) comprehend, or (c) both, allowing him time to  drain for the common good, and then -- if able to come back to a listening frame of mind -- check perceptions of what matters most in what Colin Villa had to say.  Idea to arrange a prop at home, to help ground them both -- a small cup, on the kitchen table, labelled "Colin Villa".  Purpose to remind both of them that only so much can get across in conversation, no matter how much W wants to process, incorporate, weigh out, or reckon with, and the main, most-needed, collaborative thing they can do to change the pattern of conflict and PT's anger expression is to combine forces reducing what is demanded of his attention.  Meanwhile for PT, advocated to lower the demand on himself to hear, understand, and "fix" everything he hears because it is impossible and unnecessary, and he is liable to resent Colin Villa for it when it is his own self-requirement harassing him more than anything.  What he needs is an effective response besides self-stifling and blowing up.    Special case recommendation for utterances about his history of adultery -- "You're still right that I did you wrong then.  What do you need today?"  Notes loss of some neurological acuity -- hearing, sense of balance, concentration -- since undergoing chemotherapy.  Broached possibility of "chemo brain" SE, couched as further good reason to pace the flow of information and emotional challenges.  Therapeutic modalities: Cognitive Behavioral Therapy, Assertiveness/Communication and Solution-Oriented/Positive Psychology  Mental Status/Observations:  Appearance:   Casual     Behavior:  Appropriate  Motor:  Restlestness  Speech/Language:  Clear and Coherent  Affect:  Appropriate and tense, muted  Mood:  irritable, responsive to redirection, support, humor  Thought process:  normal  Thought content:    gunnysacking  Sensory/Perceptual disturbances:    WNL, hearing deficit  Orientation:  grossly intact  Attention:  Good  Concentration:  Fair   Villa:  grossly intact  Insight:    Good  Judgment:   Good  Impulse Control:  Fair   Risk Assessment: Danger to Self: No Self-injurious Behavior: No Danger to Others: No Physical Aggression / Violence: No Duty to Warn: No Access to Firearms a concern: No  Assessment of progress:  progressing  Diagnosis:   ICD-10-CM   1. Major depressive disorder, recurrent episode, moderate (HCC)  F33.1   2. Generalized anxiety disorder  F41.1   3. Attention deficit hyperactivity disorder (ADHD), predominantly inattentive type  F90.0   4. Relationship problem between partners  Z63.0     Plan:  . Communication tips above for time-out and redirecting grudges . Other recommendations/advice as noted above . Continue to utilize previously learned skills ad lib . Maintain medication as prescribed and work faithfully with relevant prescriber(s) if any changes are desired or seem indicated . Call the clinic on-call service, present to ER, or call 911 if any life-threatening psychiatric crisis Return in about 2 weeks (around 10/21/2018).  Preferably couples session to establish concurrent understandings and alliance to change their pattern together.  [name], PhD Colin Moore, PhD LP Clinical Psychologist, [facility] [address], [place], [place] [address] [phone]

## 2018-10-08 ENCOUNTER — Ambulatory Visit: Payer: Medicare Other | Admitting: Psychiatry

## 2018-10-10 DIAGNOSIS — R197 Diarrhea, unspecified: Secondary | ICD-10-CM | POA: Diagnosis not present

## 2018-10-13 DIAGNOSIS — R197 Diarrhea, unspecified: Secondary | ICD-10-CM | POA: Diagnosis not present

## 2018-10-20 ENCOUNTER — Ambulatory Visit: Payer: Medicare Other | Admitting: Psychiatry

## 2018-10-22 ENCOUNTER — Other Ambulatory Visit: Payer: Self-pay | Admitting: Psychiatry

## 2018-10-22 ENCOUNTER — Ambulatory Visit (INDEPENDENT_AMBULATORY_CARE_PROVIDER_SITE_OTHER): Payer: Medicare Other | Admitting: Psychiatry

## 2018-10-22 ENCOUNTER — Other Ambulatory Visit: Payer: Self-pay

## 2018-10-22 DIAGNOSIS — Z630 Problems in relationship with spouse or partner: Secondary | ICD-10-CM | POA: Diagnosis not present

## 2018-10-22 DIAGNOSIS — F900 Attention-deficit hyperactivity disorder, predominantly inattentive type: Secondary | ICD-10-CM | POA: Diagnosis not present

## 2018-10-22 DIAGNOSIS — F411 Generalized anxiety disorder: Secondary | ICD-10-CM

## 2018-10-22 NOTE — Progress Notes (Signed)
Psychotherapy Progress Note [facility] Luan Moore, PhD LP  Patient ID: Colin Villa     MRN: [id number]     Therapy format: Family therapy w/o patient -- wife [name]  present Date: 10/22/2018     Start: 6:01p Stop: 7:06p Time Spent: 65 min Location: in-person   Session narrative (presenting needs, interim history, self-report of stressors and symptoms, applications of prior therapy, status changes, and interventions made in session) Briefed [name] that Medicare may not cover without PT present -- accepts OOP cost.    Personal agenda stated to understand [name] better, but begins immediately with repeating history of him leaving, getting cancer treatment, staying with their son, distancing from her and putting her in news blackout about his condition.  Allowed momentarily to recite hx, eventually clear she was not returning to subject, redirected to issues at hand.  Imelda Pillow, since his own last visit, got angry, has broken a pair of glasses and then a phone in successive outbursts, and then more recently left for the mountains without promise or plan.  Eventually clarified it was something they intended to do together, just later, and it all follows a pretty positive family beach trip, everyone seemed to enjoy it.  Interpretation given that [name] has felt a burden of guilt for his own affair years ago, particularly the idea that he set a bad example for Marylyn Ishihara, who had his own affair, more egregiously (over a year), ending his own marriage.  Roundabout story of how she got her hopes up for the marriage with time together in the mountains this spring, c. February, got back together, history of separation and finances being disjointed ... ultimately the beach trip more recently and how she discovered a $500 check written by PT to [facility], not disclosed to her, and how she waited and asked PT about it after they got home.  Combined issue of his secrecy (re-evoking the affair) and his  noncontribution to the household (accuracy?).  As discussed here, far from clear or agreed to what their standards would be for who was to pay what bills after they reunited a couple months ago.  She reads recent text from PT, 4 days ago, apologizing for not including her and stating he will always want to help the kids, and her if she needs it.  Suggestive of a decision to separate, but as usual, not clear, and W remains very talkative and interpretive of his behavior.  As part of that, she notes hx of PT having a depressing FOO, somaticizing mother, darkened household environment, emotionally lonely upbringing.    With opening to reply, allowed that PT had experience which set him up to be guarded about intimacy, prone to feel guilty, perhaps far from intuitive about a mate's emotions, and apt to be distractible under stress and stressed by his own distractibility.  Attempted to inquire what became of homework from last session with PT -- namely, the idea of citing his short attention span and small working memory and explicitly asking to focus or to take a break if he feels overwhelmed -- but wife says she heard nothing of it, including the idea to put a small glass on the table with a sign as a "prop" to remind them both that short attention and working memory will be a pressure point for anger and a place they most likely need to collaborate.  Other indications from wife that she is considerably more irritated and protective now than she let  on, she now feels at risk for being taken advantage of financially, and   Therapeutic modalities: Assertiveness/Communication and Solution-Oriented/Positive Psychology  Mental Status/Observations:  PT not present.  Wife loquacious, tangential, and overly detailed but nonpsychotic, nondangerous.  Risk Assessment: Danger to Self: No Self-injurious Behavior: No Danger to Others: No Physical Aggression / Violence: No Duty to Warn: No Access to Firearms a concern:  No  Assessment of progress:  deteriorating -- in need of reassessment  Diagnosis:   ICD-10-CM   1. Generalized anxiety disorder  F41.1   2. Attention deficit hyperactivity disorder (ADHD), predominantly inattentive type  F90.0   3. Relationship problem between partners  Z63.0    Plan:  . Wife to please ask PT to be in touch, as he has no scheduled times, and his failure to be present seems to indicate abandonment of therapy as well as the marriage . Urging to W to try to keep her own communications with PT as short and to the point as possible and recognize quickly when more words only inflame tensions, not process issues  . Other recommendations/advice as noted above . Continue to utilize previously learned skills ad lib . Maintain medication as prescribed and work faithfully with relevant prescriber(s) if any changes are desired or seem indicated . Call the clinic on-call service, present to ER, or call 911 if any life-threatening psychiatric crisis Return unclear.  [name], PhD Luan Moore, PhD LP Clinical Psychologist, [facility] [address], [place], [place] [address] [phone]

## 2018-10-26 ENCOUNTER — Other Ambulatory Visit: Payer: Self-pay | Admitting: Psychiatry

## 2018-10-26 DIAGNOSIS — F331 Major depressive disorder, recurrent, moderate: Secondary | ICD-10-CM

## 2018-11-06 ENCOUNTER — Ambulatory Visit (INDEPENDENT_AMBULATORY_CARE_PROVIDER_SITE_OTHER): Payer: Medicare Other | Admitting: Psychiatry

## 2018-11-06 ENCOUNTER — Encounter: Payer: Self-pay | Admitting: Psychiatry

## 2018-11-06 ENCOUNTER — Telehealth: Payer: Self-pay | Admitting: Psychiatry

## 2018-11-06 ENCOUNTER — Other Ambulatory Visit: Payer: Self-pay

## 2018-11-06 DIAGNOSIS — F331 Major depressive disorder, recurrent, moderate: Secondary | ICD-10-CM | POA: Diagnosis not present

## 2018-11-06 DIAGNOSIS — F900 Attention-deficit hyperactivity disorder, predominantly inattentive type: Secondary | ICD-10-CM

## 2018-11-06 DIAGNOSIS — F411 Generalized anxiety disorder: Secondary | ICD-10-CM | POA: Diagnosis not present

## 2018-11-06 MED ORDER — ARIPIPRAZOLE 2 MG PO TABS: 2.0000 mg | ORAL_TABLET | Freq: Every day | ORAL | 1 refills | Status: DC

## 2018-11-06 MED ORDER — PAROXETINE HCL 30 MG PO TABS: 60.0000 mg | ORAL_TABLET | Freq: Every day | ORAL | 1 refills | Status: DC

## 2018-11-06 MED ORDER — CLONAZEPAM 0.5 MG PO TABS: 0.5000 mg | ORAL_TABLET | Freq: Two times a day (BID) | ORAL | 5 refills | Status: DC | PRN

## 2018-11-06 NOTE — Telephone Encounter (Signed)
Wife, [name], called to talk to me about supposed insurance question, but then relayed entire history and goings on of the household. (Which she frequently does) Then she asked about whether you had contacted [name] from the last time she spoke with you.

## 2018-11-06 NOTE — Telephone Encounter (Signed)
Admin note for no-charge patient contact  Patient ID: Colin Villa  MRN: [id number] DATE: 11/06/2018  Wife recently seen in unexpected family therapy w/o PT present encounter, allowed under prior understanding that PT and W were working on a reconciliation (see note for content).  On request to Dr. Clovis Pu, PT was asked yesterday about his own intentions for therapy, with return word that he is separated but still interested in individual service.  Message received today, from Fabiola Backer, by way of office mgr, who fielded an insurance question about coverage for the session and asked whether [place] had contacted PT, as discussed at that session.  Given Pt report of separation, determination made that ROI to W is now null and void by implication, and default privacy rights apply.  Authorized ofc mgr to inform [name] that the attempt was made, as promised, and that all further inquiries about her husband need to go directly to him, per privacy laws.  [name], PhD

## 2018-11-06 NOTE — Progress Notes (Signed)
HAROON NIVAR [id number] [birth year] 78 y.o.   Virtual Visit via Telephone Note  I connected with pt by telephone and verified that I am speaking with the correct person using two identifiers.   I discussed the limitations, risks, security and privacy concerns of performing an evaluation and management service by telephone and the availability of in person appointments. I also discussed with the patient that there may be a patient responsible charge related to this service. The patient expressed understanding and agreed to proceed.  I discussed the assessment and treatment plan with the patient. The patient was provided an opportunity to ask questions and all were answered. The patient agreed with the plan and demonstrated an understanding of the instructions.   The patient was advised to call back or seek an in-person evaluation if the symptoms worsen or if the condition fails to improve as anticipated.  I provided 25 minutes of non-face-to-face time during this encounter. The call started at one third and ended at 155. [place] patient [place] and the provider was located office.   Subjective:   Patient ID:  Colin Villa is a 78 y.o. (DOB 1940-11-30) male.  Chief Complaint:  Chief Complaint  Patient presents with  . Follow-up    Medication Management and anger  . Depression    Medication Management  . Anxiety    Medication Management    Depression        Associated symptoms include no decreased concentration and no suicidal ideas.  Past medical history includes anxiety.   Anxiety Symptoms include nervous/anxious behavior. Patient reports no confusion, decreased concentration or suicidal ideas.     Colin Villa presents to the office today for follow-up of chronic anxiety and marital stress.  At visit November 2019.  No meds were changed.  Since that appointment he was diagnosed with esophageal cancer.  He called in between appointments with more  depression and anxiety and mirtazapine 30 mg nightly was added because he was on the maximum dosage of paroxetine.  Last visit July 03, 2018 and started Abilify 2 mg for depression.  He thinks it helped the depression. It comes and goes.  Separated from wife and living with son right now.  Times he feels well and others more down but not as deep as in the past.  Unsure of his decisions at times.  Times of good function.  Does chronically worry about the marriage but handling it better than in the past.  Now sleep is OK.  No intrusive images or NM like before.   Sleep at least 7 hours or more.  A lot less anxiety but feels he needs the clonazepam BID.  Less anger now bc not with Colin Villa.  Still get anger but less yelling and acting it out.  F had anger outbursts when he was a kid also.    His wife chronically complains about him.  Due to chronic marital problems he lives off  And on with wife or son.  D says he should not live with  Wife.  His wife alleges that he has dementia.  Does not allege any physical violence.  They do continue to argue.  He continues to stay separate from wife at this time bc arguing with wife again. Appetite fair.  Weight stable.  Conc, energy, interest, enjoyment all down.  No SI  No accidents.  Got lost once going to a school that he had not been to before.  No concerns about his driving.  No driving tickets.  No fender benders.  Says memory is not impaired more than others at 78 yo.  Drinking less than 2 beers once or twice a week.  Prior psychiatric medication trials include Wellbutrin 400 mg a day, olanzapine, buspirone, Ritalin, lithium 900 mg daily, Depakote, and Strattera. Brief mirtazapine ? Reason Abilify 2 helped Modafinil with limited response?  Review of Systems:  Review of Systems  Gastrointestinal: Positive for abdominal pain.  Neurological: Negative for tremors and weakness.  Psychiatric/Behavioral: Positive for depression. Negative for agitation, behavioral  problems, confusion, decreased concentration, dysphoric mood, hallucinations, self-injury, sleep disturbance and suicidal ideas. The patient is nervous/anxious. The patient is not hyperactive.    Sees PCP tomorrow. Medications: I have reviewed the patient's current medications.  Current Outpatient Medications  Medication Sig Dispense Refill  . ARIPiprazole (ABILIFY) 2 MG tablet Take 1 tablet (2 mg total) by mouth daily. 90 tablet 1  . Ascorbic Acid (VITAMIN C) 1000 MG tablet Take 1,000 mg by mouth daily.    . carvedilol (COREG) 3.125 MG tablet TAKE 1 TABLET BY MOUTH TWICE DAILY.(KEEP APPOINTMENT IN NOV FOR REFILLS). 60 tablet 1  . carvedilol (COREG) 3.125 MG tablet TAKE 1 TABLET(3.125 MG) BY MOUTH TWICE DAILY 60 tablet 11  . clonazePAM (KLONOPIN) 0.5 MG tablet Take 1 tablet (0.5 mg total) by mouth 2 (two) times daily as needed. for anxiety 60 tablet 5  . Ferrous Sulfate 134 MG TABS Take by mouth.    Marland Kitchen PARoxetine (PAXIL) 30 MG tablet Take 2 tablets (60 mg total) by mouth daily. 180 tablet 1  . ramipril (ALTACE) 2.5 MG capsule Take 2.5 mg by mouth daily.    . rosuvastatin (CRESTOR) 40 MG tablet TAKE 1 TABLET BY MOUTH DAILY 90 tablet 2  . tamsulosin (FLOMAX) 0.4 MG CAPS capsule Take 0.4 mg by mouth daily.   11  . XARELTO 20 MG TABS tablet      No current facility-administered medications for this visit.     Medication Side Effects: None, no sleepiness.  Allergies:  Allergies  Allergen Reactions  . Sulfa Antibiotics Other (See Comments)    Childhood allergy    Past Medical History:  Diagnosis Date  . ADHD   . BPH (benign prostatic hyperplasia)   . Colonic polyp   . Depression   . Elevated fasting glucose   . Elevated PSA   . Essential hypertension, benign   . Hypertension   . Kidney stones   . Mixed dyslipidemia   . Osteoarthritis    Especially of the left kneee which end stage     Family History  Problem Relation Age of Onset  . Heart failure Mother   . Melanoma Mother    . Mental illness Mother   . Heart failure Father   . Cancer Brother        bladder  . Diabetes Brother     Social History   Socioeconomic History  . Marital status: Married    Spouse name: Colin Villa  . Number of children: 2  . Years of education: 24  . Highest education level: Not on file  Occupational History    Comment: retired  Scientific laboratory technician  . Financial resource strain: Not on file  . Food insecurity    Worry: Not on file    Inability: Not on file  . Transportation needs    Medical: Not on file    Non-medical: Not on file  Tobacco Use  .  Smoking status: Former Smoker    Quit date: 01/27/1996    Years since quitting: 22.7  . Smokeless tobacco: Never Used  Substance and Sexual Activity  . Alcohol use: Yes    Comment: 4 beers weekly  . Drug use: No  . Sexual activity: Not on file  Lifestyle  . Physical activity    Days per week: Not on file    Minutes per session: Not on file  . Stress: Not on file  Relationships  . Social Herbalist on phone: Not on file    Gets together: Not on file    Attends religious service: Not on file    Active member of club or organization: Not on file    Attends meetings of clubs or organizations: Not on file    Relationship status: Not on file  . Intimate partner violence    Fear of current or ex partner: Not on file    Emotionally abused: Not on file    Physically abused: Not on file    Forced sexual activity: Not on file  Other Topics Concern  . Not on file  Social History Narrative  . Not on file    Past Medical History, Surgical history, Social history, and Family history were reviewed and updated as appropriate.   Please see review of systems for further details on the patient's review from today.   Objective:   Physical Exam:  There were no vitals taken for this visit.  Physical Exam Neurological:     Mental Status: He is alert and oriented to person, place, and time.     Cranial Nerves: No dysarthria.   Psychiatric:        Attention and Perception: Attention normal.        Mood and Affect: Mood is not anxious or depressed.        Speech: Speech normal.        Behavior: Behavior is cooperative.        Thought Content: Thought content normal. Thought content is not paranoid or delusional. Thought content does not include homicidal or suicidal ideation. Thought content does not include homicidal or suicidal plan.        Cognition and Memory: Cognition and memory normal.        Judgment: Judgment normal.     Comments: Insight fair. Less depression and anxiety      Lab Review:     Component Value Date/Time   NA 137 08/10/2016 1202   K 4.1 08/10/2016 1202   CL 106 08/10/2016 1202   CO2 24 08/10/2016 1202   GLUCOSE 138 (H) 08/10/2016 1202   BUN 9 08/10/2016 1202   CREATININE 0.90 08/10/2016 1202   CALCIUM 9.1 08/10/2016 1202   PROT 6.6 12/06/2016 0812   ALBUMIN 4.4 12/06/2016 0812   AST 24 12/06/2016 0812   ALT 21 12/06/2016 0812   ALKPHOS 77 12/06/2016 0812   BILITOT 0.6 12/06/2016 0812   GFRNONAA >60 08/10/2016 1202   GFRAA >60 08/10/2016 1202       Component Value Date/Time   WBC 6.3 08/10/2016 1202   RBC 4.31 08/10/2016 1202   HGB 14.3 08/10/2016 1202   HCT 39.2 08/10/2016 1202   PLT 114 (L) 08/10/2016 1202   MCV 91.0 08/10/2016 1202   MCH 33.2 08/10/2016 1202   MCHC 36.5 (H) 08/10/2016 1202   RDW 13.4 08/10/2016 1202    No results found for: POCLITH, LITHIUM   No results found for:  PHENYTOIN, PHENOBARB, VALPROATE, CBMZ   .res Assessment: Plan:    Gokul was seen today for follow-up, depression and anxiety.  Diagnoses and all orders for this visit:  Generalized anxiety disorder -     ARIPiprazole (ABILIFY) 2 MG tablet; Take 1 tablet (2 mg total) by mouth daily. -     PARoxetine (PAXIL) 30 MG tablet; Take 2 tablets (60 mg total) by mouth daily. -     clonazePAM (KLONOPIN) 0.5 MG tablet; Take 1 tablet (0.5 mg total) by mouth 2 (two) times daily as needed.  for anxiety  Major depressive disorder, recurrent episode, moderate (HCC) -     ARIPiprazole (ABILIFY) 2 MG tablet; Take 1 tablet (2 mg total) by mouth daily. -     PARoxetine (PAXIL) 30 MG tablet; Take 2 tablets (60 mg total) by mouth daily.  Attention deficit hyperactivity disorder (ADHD), predominantly inattentive type   Greater than 50% of 30 min face to face time with patient was spent on counseling and coordination of care. We discussed More depressed and anxious DT cancer and heart problems at the last visit.  We started Abilify 2 mg and he gained benefit with depression anxiety and anger.  There are continued conflicts with his wife which are confounding a clear view of medication effects.  The conflicts wax and wane.  His sense of desperation though has resolved.  He is more days.  Still has residual anxiety and feels he needs the clonazepam..    Discussed potential metabolic side effects associated with atypical antipsychotics, as well as potential risk for movement side effects. Advised pt to contact office if movement side effects occur.  Continue Abilify 2 mg daily. Consider increasing Abilify if needed.   Consider mirtazapine if needed.  We discussed the short-term risks associated with benzodiazepines including sedation and increased fall risk among others.  Discussed long-term side effect risk including dependence, potential withdrawal symptoms, and the potential eventual dose-related risk of dementia.  Recommend reducing the am dosage of clonazepam to help protect memory.  Try to reduce the morning clonazepam to 1/2 tablet in the morning.  Be careful about any alcohol with the clonazepam.  He does not feel he can reduce the clonazepam. Continue clonazepam 0.5 mg twice daily  Disc high dosage paroxetine.  He tolerates it and benefits.  He remains under a lot of stress. Continue paroxetine 60 mg daily  No med changes today.  Supportive and problem solving therapy for cancer and  chronic marital problems and depression.   He plans to continue counseling with Dr. Rica Mote.  It is helpful  30 in 4-6 mos  Lynder Parents, MD, DFAPA   No future appointments.  No orders of the defined types were placed in this encounter.     -------------------------------

## 2018-11-14 IMAGING — NM NM MISC PROCEDURE
6 series · 36 of 36 positions shown · non-contrast
Comparison: none

[Series 1: rest · 6.51mm/px · 6 of 64 frames shown]
[frame 6/64]
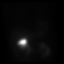
[frame 16/64]
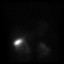
[frame 27/64]
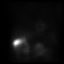
[frame 38/64]
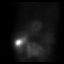
[frame 48/64]
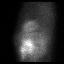
[frame 59/64]
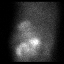

[Series 1: wbr_r-proj_st rest · 6.51mm/px · 6 of 64 frames shown]
[frame 6/64]
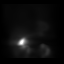
[frame 16/64]
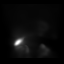
[frame 27/64]
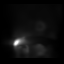
[frame 38/64]
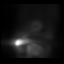
[frame 48/64]
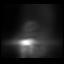
[frame 59/64]
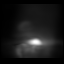

[Series 2: wbr_s-proj_st stress · 6.51mm/px · 6 of 64 frames shown (1 of 2)]
[frame 6/64]
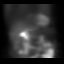
[frame 16/64]
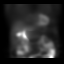
[frame 27/64]
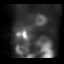
[frame 38/64]
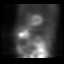
[frame 48/64]
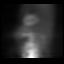
[frame 59/64]
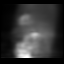

[Series 2: wbr_s-proj_st stress · 6.51mm/px · 6 of 512 frames shown (2 of 2)]
[frame 43/512]
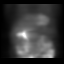
[frame 128/512]
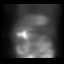
[frame 214/512]
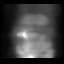
[frame 299/512]
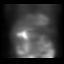
[frame 384/512]
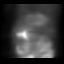
[frame 470/512]
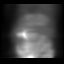

[Series 2: stress · 6.51mm/px · 6 of 64 frames shown (1 of 2)]
[frame 6/64]
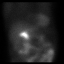
[frame 16/64]
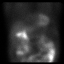
[frame 27/64]
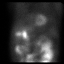
[frame 38/64]
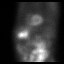
[frame 48/64]
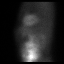
[frame 59/64]
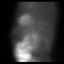

[Series 2: stress · 6.51mm/px · 6 of 512 frames shown (2 of 2)]
[frame 43/512]
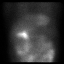
[frame 128/512]
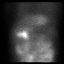
[frame 214/512]
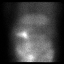
[frame 299/512]
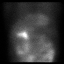
[frame 384/512]
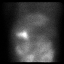
[frame 470/512]
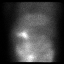

[36 of 36 positions shown; findings below may reference images not displayed]

Canned report from images found in remote index.

Refer to host system for actual result text.

## 2018-11-17 ENCOUNTER — Ambulatory Visit (INDEPENDENT_AMBULATORY_CARE_PROVIDER_SITE_OTHER): Payer: Medicare Other | Admitting: Psychiatry

## 2018-11-17 ENCOUNTER — Other Ambulatory Visit: Payer: Self-pay

## 2018-11-17 DIAGNOSIS — F900 Attention-deficit hyperactivity disorder, predominantly inattentive type: Secondary | ICD-10-CM | POA: Diagnosis not present

## 2018-11-17 DIAGNOSIS — F331 Major depressive disorder, recurrent, moderate: Secondary | ICD-10-CM

## 2018-11-17 DIAGNOSIS — F338 Other recurrent depressive disorders: Secondary | ICD-10-CM | POA: Diagnosis not present

## 2018-11-17 DIAGNOSIS — I10 Essential (primary) hypertension: Secondary | ICD-10-CM | POA: Diagnosis not present

## 2018-11-17 DIAGNOSIS — N401 Enlarged prostate with lower urinary tract symptoms: Secondary | ICD-10-CM | POA: Diagnosis not present

## 2018-11-17 DIAGNOSIS — F411 Generalized anxiety disorder: Secondary | ICD-10-CM | POA: Diagnosis not present

## 2018-11-17 DIAGNOSIS — Z8501 Personal history of malignant neoplasm of esophagus: Secondary | ICD-10-CM | POA: Diagnosis not present

## 2018-11-17 DIAGNOSIS — E782 Mixed hyperlipidemia: Secondary | ICD-10-CM | POA: Diagnosis not present

## 2018-11-17 DIAGNOSIS — Z630 Problems in relationship with spouse or partner: Secondary | ICD-10-CM

## 2018-11-17 DIAGNOSIS — R972 Elevated prostate specific antigen [PSA]: Secondary | ICD-10-CM | POA: Diagnosis not present

## 2018-11-17 DIAGNOSIS — R197 Diarrhea, unspecified: Secondary | ICD-10-CM | POA: Diagnosis not present

## 2018-11-17 DIAGNOSIS — I4891 Unspecified atrial fibrillation: Secondary | ICD-10-CM | POA: Diagnosis not present

## 2018-11-17 DIAGNOSIS — R7301 Impaired fasting glucose: Secondary | ICD-10-CM | POA: Diagnosis not present

## 2018-11-17 NOTE — Progress Notes (Signed)
Psychotherapy Progress Note [facility] Colin Moore, PhD LP  Patient ID: Colin Villa     MRN: [id number]     Therapy format: Individual psychotherapy Date: 11/17/2018     Start: 11:16a Stop: 12:05n Time Spent: 49 min Location: in-person   Session narrative (presenting needs, interim history, self-report of stressors and symptoms, applications of prior therapy, status changes, and interventions made in session) Initially alleges things are going better, wants to engage weekly or biweekly for couple counseling.  Wants to continue the marriage, though he is living with son in [place].  Reviewed recent happenings, including W contacting atty, discussion with [name] about wishes/agenda.  As usual, can't abide her constant, all-day talking, common experience of her correcting his choices and interpreting his upbringing, etc. as making him helplessly the way he is, continuing the double-bind of casting him at fault and futile to change at the same time, without insight.  Framed utility responses to W's monologuing and putdowns -- (1) "You've told me this -- why now?"  (2) "If you want me to do something, please just ask straight."  (3) "My willingness to do things depends, in part, on whether you can drop the monologue." and (4) "What are you actually asking?"  Clarified central need to control the process, making short work of critical run-ons and get to what is practical.    [name] wrote him an email recently about needing information on shared property, then turned around and asked if he will take [place] to the mountains because she needs to get away and feels too jittery to drive herself.  Clarified that he also wants a getaway but is leery of getting shut in with her and one car for several days.  Resolved to be willing to say so.  Checked text end of session, from [name], feels "railroaded" by her presuming they are going to the mountains.  Assured he does not have to agree to it  just because of that and may assert his own wishes if it makes sense to him.  Therapeutic modalities: Cognitive Behavioral Therapy, Assertiveness/Communication and Solution-Oriented/Positive Psychology  Mental Status/Observations:  Appearance:   Casual     Behavior:  Appropriate  Motor:  Normal  Speech/Language:   Clear and Coherent  Affect:  Appropriate  Mood:  anxious and irritable  Thought process:  normal  Thought content:    Rumination  Sensory/Perceptual disturbances:    WNL  Orientation:  Fully oriented  Attention:  Fair  Concentration:  Fair  Memory:  grossly intact  Insight:    Fair  Judgment:   Good  Impulse Control:  Fair   Risk Assessment: Danger to Self: No Self-injurious Behavior: No Danger to Others: No Physical Aggression / Violence: No Duty to Warn: No Access to Firearms a concern: No  Assessment of progress:  stabilized  Diagnosis:   ICD-10-CM   1. Generalized anxiety disorder  F41.1   2. Attention deficit hyperactivity disorder (ADHD), predominantly inattentive type  F90.0   3. Major depressive disorder, recurrent episode, moderate (HCC)  F33.1   4. Relationship problem between partners  Z63.0     Plan:  . Assert difference with plans if desired . In dealing with wife's behavior, employ responses above . Other recommendations/advice as noted above . Continue to utilize previously learned skills ad lib . Maintain medication as prescribed and work faithfully with relevant prescriber(s) if any changes are desired or seem indicated . Call the clinic on-call service, present to  ER, or call 911 if any life-threatening psychiatric crisis Return in about 2 weeks (around 12/01/2018) for time as available.  [name], PhD Colin Moore, PhD LP Clinical Psychologist, [facility] [address] [place], [place] [address] [phone]

## 2018-11-20 DIAGNOSIS — Z8501 Personal history of malignant neoplasm of esophagus: Secondary | ICD-10-CM | POA: Diagnosis not present

## 2018-11-20 DIAGNOSIS — K529 Noninfective gastroenteritis and colitis, unspecified: Secondary | ICD-10-CM | POA: Diagnosis not present

## 2018-11-20 DIAGNOSIS — D126 Benign neoplasm of colon, unspecified: Secondary | ICD-10-CM | POA: Diagnosis not present

## 2018-12-09 ENCOUNTER — Ambulatory Visit: Payer: Medicare Other | Admitting: Psychiatry

## 2018-12-10 ENCOUNTER — Other Ambulatory Visit: Payer: Self-pay

## 2018-12-10 MED ORDER — ROSUVASTATIN CALCIUM 40 MG PO TABS: 40.0000 mg | ORAL_TABLET | Freq: Every day | ORAL | 2 refills | Status: DC

## 2018-12-15 ENCOUNTER — Other Ambulatory Visit: Payer: Self-pay | Admitting: Psychiatry

## 2018-12-15 DIAGNOSIS — F331 Major depressive disorder, recurrent, moderate: Secondary | ICD-10-CM

## 2018-12-15 DIAGNOSIS — F411 Generalized anxiety disorder: Secondary | ICD-10-CM

## 2018-12-18 ENCOUNTER — Other Ambulatory Visit: Payer: Self-pay | Admitting: Cardiology

## 2018-12-18 DIAGNOSIS — F329 Major depressive disorder, single episode, unspecified: Secondary | ICD-10-CM | POA: Diagnosis not present

## 2018-12-18 DIAGNOSIS — D61818 Other pancytopenia: Secondary | ICD-10-CM | POA: Diagnosis not present

## 2018-12-18 DIAGNOSIS — C154 Malignant neoplasm of middle third of esophagus: Secondary | ICD-10-CM | POA: Diagnosis not present

## 2018-12-18 DIAGNOSIS — I309 Acute pericarditis, unspecified: Secondary | ICD-10-CM | POA: Diagnosis not present

## 2018-12-18 DIAGNOSIS — R778 Other specified abnormalities of plasma proteins: Secondary | ICD-10-CM | POA: Diagnosis not present

## 2018-12-18 DIAGNOSIS — I4892 Unspecified atrial flutter: Secondary | ICD-10-CM | POA: Diagnosis not present

## 2018-12-18 DIAGNOSIS — C155 Malignant neoplasm of lower third of esophagus: Secondary | ICD-10-CM | POA: Diagnosis not present

## 2018-12-18 DIAGNOSIS — I10 Essential (primary) hypertension: Secondary | ICD-10-CM | POA: Diagnosis not present

## 2018-12-18 DIAGNOSIS — K209 Esophagitis, unspecified without bleeding: Secondary | ICD-10-CM | POA: Diagnosis not present

## 2018-12-18 DIAGNOSIS — Z9221 Personal history of antineoplastic chemotherapy: Secondary | ICD-10-CM | POA: Diagnosis not present

## 2018-12-18 DIAGNOSIS — Z01812 Encounter for preprocedural laboratory examination: Secondary | ICD-10-CM | POA: Diagnosis not present

## 2018-12-18 DIAGNOSIS — Z923 Personal history of irradiation: Secondary | ICD-10-CM | POA: Diagnosis not present

## 2018-12-18 DIAGNOSIS — Z87891 Personal history of nicotine dependence: Secondary | ICD-10-CM | POA: Diagnosis not present

## 2018-12-18 DIAGNOSIS — Z20828 Contact with and (suspected) exposure to other viral communicable diseases: Secondary | ICD-10-CM | POA: Diagnosis not present

## 2018-12-18 DIAGNOSIS — I2510 Atherosclerotic heart disease of native coronary artery without angina pectoris: Secondary | ICD-10-CM | POA: Diagnosis not present

## 2018-12-18 DIAGNOSIS — Z7901 Long term (current) use of anticoagulants: Secondary | ICD-10-CM | POA: Diagnosis not present

## 2018-12-18 DIAGNOSIS — I480 Paroxysmal atrial fibrillation: Secondary | ICD-10-CM | POA: Diagnosis not present

## 2018-12-19 DIAGNOSIS — Z9221 Personal history of antineoplastic chemotherapy: Secondary | ICD-10-CM | POA: Diagnosis not present

## 2018-12-19 DIAGNOSIS — M1990 Unspecified osteoarthritis, unspecified site: Secondary | ICD-10-CM | POA: Diagnosis not present

## 2018-12-19 DIAGNOSIS — D696 Thrombocytopenia, unspecified: Secondary | ICD-10-CM | POA: Diagnosis not present

## 2018-12-19 DIAGNOSIS — Z923 Personal history of irradiation: Secondary | ICD-10-CM | POA: Diagnosis not present

## 2018-12-19 DIAGNOSIS — I480 Paroxysmal atrial fibrillation: Secondary | ICD-10-CM | POA: Diagnosis not present

## 2018-12-19 DIAGNOSIS — N400 Enlarged prostate without lower urinary tract symptoms: Secondary | ICD-10-CM | POA: Diagnosis not present

## 2018-12-19 DIAGNOSIS — Z8249 Family history of ischemic heart disease and other diseases of the circulatory system: Secondary | ICD-10-CM | POA: Diagnosis not present

## 2018-12-19 DIAGNOSIS — Z85828 Personal history of other malignant neoplasm of skin: Secondary | ICD-10-CM | POA: Diagnosis not present

## 2018-12-19 DIAGNOSIS — Z791 Long term (current) use of non-steroidal anti-inflammatories (NSAID): Secondary | ICD-10-CM | POA: Diagnosis not present

## 2018-12-19 DIAGNOSIS — I440 Atrioventricular block, first degree: Secondary | ICD-10-CM | POA: Diagnosis not present

## 2018-12-19 DIAGNOSIS — I4892 Unspecified atrial flutter: Secondary | ICD-10-CM | POA: Diagnosis not present

## 2018-12-19 DIAGNOSIS — Z8501 Personal history of malignant neoplasm of esophagus: Secondary | ICD-10-CM | POA: Diagnosis not present

## 2018-12-19 DIAGNOSIS — C154 Malignant neoplasm of middle third of esophagus: Secondary | ICD-10-CM | POA: Diagnosis not present

## 2018-12-19 DIAGNOSIS — I10 Essential (primary) hypertension: Secondary | ICD-10-CM | POA: Diagnosis not present

## 2018-12-19 DIAGNOSIS — Z87891 Personal history of nicotine dependence: Secondary | ICD-10-CM | POA: Diagnosis not present

## 2018-12-19 DIAGNOSIS — I2510 Atherosclerotic heart disease of native coronary artery without angina pectoris: Secondary | ICD-10-CM | POA: Diagnosis not present

## 2018-12-19 DIAGNOSIS — Z7901 Long term (current) use of anticoagulants: Secondary | ICD-10-CM | POA: Diagnosis not present

## 2018-12-19 DIAGNOSIS — Z08 Encounter for follow-up examination after completed treatment for malignant neoplasm: Secondary | ICD-10-CM | POA: Diagnosis not present

## 2018-12-19 DIAGNOSIS — C159 Malignant neoplasm of esophagus, unspecified: Secondary | ICD-10-CM | POA: Diagnosis not present

## 2018-12-19 DIAGNOSIS — Z79899 Other long term (current) drug therapy: Secondary | ICD-10-CM | POA: Diagnosis not present

## 2018-12-29 DIAGNOSIS — I2510 Atherosclerotic heart disease of native coronary artery without angina pectoris: Secondary | ICD-10-CM | POA: Diagnosis not present

## 2018-12-29 DIAGNOSIS — I10 Essential (primary) hypertension: Secondary | ICD-10-CM | POA: Diagnosis not present

## 2018-12-29 DIAGNOSIS — C154 Malignant neoplasm of middle third of esophagus: Secondary | ICD-10-CM | POA: Diagnosis not present

## 2018-12-29 DIAGNOSIS — I480 Paroxysmal atrial fibrillation: Secondary | ICD-10-CM | POA: Diagnosis not present

## 2019-01-01 ENCOUNTER — Other Ambulatory Visit: Payer: Self-pay | Admitting: Psychiatry

## 2019-01-01 ENCOUNTER — Other Ambulatory Visit: Payer: Self-pay | Admitting: Cardiology

## 2019-01-01 DIAGNOSIS — F411 Generalized anxiety disorder: Secondary | ICD-10-CM

## 2019-01-01 DIAGNOSIS — F331 Major depressive disorder, recurrent, moderate: Secondary | ICD-10-CM

## 2019-01-03 NOTE — Telephone Encounter (Signed)
Submitted to [facility], please clarify pharmacy

## 2019-01-05 NOTE — Telephone Encounter (Signed)
Left pt a v/m.

## 2019-01-06 NOTE — Telephone Encounter (Signed)
[facility] in [place] is correct.

## 2019-01-06 NOTE — Telephone Encounter (Signed)
Noted thank you

## 2019-01-08 ENCOUNTER — Encounter: Payer: Self-pay | Admitting: Psychiatry

## 2019-01-08 ENCOUNTER — Ambulatory Visit (INDEPENDENT_AMBULATORY_CARE_PROVIDER_SITE_OTHER): Payer: Medicare Other | Admitting: Psychiatry

## 2019-01-08 ENCOUNTER — Other Ambulatory Visit: Payer: Self-pay

## 2019-01-08 DIAGNOSIS — F411 Generalized anxiety disorder: Secondary | ICD-10-CM

## 2019-01-08 DIAGNOSIS — F900 Attention-deficit hyperactivity disorder, predominantly inattentive type: Secondary | ICD-10-CM

## 2019-01-08 DIAGNOSIS — Z630 Problems in relationship with spouse or partner: Secondary | ICD-10-CM

## 2019-01-08 DIAGNOSIS — F331 Major depressive disorder, recurrent, moderate: Secondary | ICD-10-CM

## 2019-01-08 DIAGNOSIS — R197 Diarrhea, unspecified: Secondary | ICD-10-CM | POA: Diagnosis not present

## 2019-01-08 NOTE — Progress Notes (Signed)
Colin Villa:[id number] 01/02/1941 78 y.o.    Subjective:   Patient ID:  Colin Villa is a 78 y.o. (DOB [birth year]) male.  Chief Complaint:  Chief Complaint  Patient presents with  . Follow-up    Medication Management  . Anxiety    Medication Management  . Depression    Medication Management    Depression        Associated symptoms include no decreased concentration and no suicidal ideas.  Past medical history includes anxiety.   Anxiety Symptoms include nervous/anxious behavior. Patient reports no confusion, decreased concentration or suicidal ideas.     Colin Villa presents to the office today for follow-up of chronic anxiety and marital stress.  At visit November 2019.  No meds were changed.  Since that appointment he was diagnosed with esophageal cancer.  He called in between appointments with more depression and anxiety and mirtazapine 30 mg nightly was added because he was on the maximum dosage of paroxetine.  visit July 03, 2018 and started Abilify 2 mg for depression.  Last visit November 06, 2018.  No meds were changed.  He called to move this appointment earlier.  I had previously refused to see his wife again.  And have repeated that refusal on a number of occasions.  She came into the appointment today without my approval stating she wanted me to help him with his anger problem.  This is a repetition of the same issues from before.  He has no anger problems outside of his relationship with her.  No significant change of mood otherwise or other psychiatric symptoms.  Drinking less than 2 beers once or twice a week.  Prior psychiatric medication trials include Wellbutrin 400 mg a day, olanzapine, buspirone, Ritalin, lithium 900 mg daily, Depakote, and Strattera. Brief mirtazapine ? Reason Abilify 2 helped Modafinil with limited response?  Review of Systems:  Review of Systems  Gastrointestinal: Positive for abdominal pain.  Neurological: Negative for  tremors and weakness.  Psychiatric/Behavioral: Positive for depression. Negative for agitation, behavioral problems, confusion, decreased concentration, dysphoric mood, hallucinations, self-injury, sleep disturbance and suicidal ideas. The patient is nervous/anxious. The patient is not hyperactive.    Sees PCP tomorrow. Medications: I have reviewed the patient's current medications.  Current Outpatient Medications  Medication Sig Dispense Refill  . ARIPiprazole (ABILIFY) 2 MG tablet Take 1 tablet (2 mg total) by mouth daily. 90 tablet 1  . Ascorbic Acid (VITAMIN C) 1000 MG tablet Take 1,000 mg by mouth daily.    . clonazePAM (KLONOPIN) 0.5 MG tablet Take 1 tablet (0.5 mg total) by mouth 2 (two) times daily as needed. for anxiety 60 tablet 5  . Ferrous Sulfate 134 MG TABS Take by mouth.    Marland Kitchen PARoxetine (PAXIL) 30 MG tablet TAKE 2 TABLETS(60 MG) BY MOUTH DAILY 180 tablet 1  . ramipril (ALTACE) 2.5 MG capsule Take 2.5 mg by mouth daily.    . rosuvastatin (CRESTOR) 40 MG tablet Take 1 tablet (40 mg total) by mouth daily. 90 tablet 2  . tamsulosin (FLOMAX) 0.4 MG CAPS capsule Take 0.4 mg by mouth daily.   11  . XARELTO 20 MG TABS tablet      No current facility-administered medications for this visit.    Medication Side Effects: None, no sleepiness.  Allergies:  Allergies  Allergen Reactions  . Sulfa Antibiotics Other (See Comments)    Childhood allergy    Past Medical History:  Diagnosis Date  . ADHD   .  BPH (benign prostatic hyperplasia)   . Colonic polyp   . Depression   . Elevated fasting glucose   . Elevated PSA   . Essential hypertension, benign   . Hypertension   . Kidney stones   . Mixed dyslipidemia   . Osteoarthritis    Especially of the left kneee which end stage     Family History  Problem Relation Age of Onset  . Heart failure Mother   . Melanoma Mother   . Mental illness Mother   . Heart failure Father   . Cancer Brother        bladder  . Diabetes  Brother     Social History   Socioeconomic History  . Marital status: Married    Spouse name: Colin Villa  . Number of children: 2  . Years of education: 57  . Highest education level: Not on file  Occupational History    Comment: retired  Tobacco Use  . Smoking status: Former Smoker    Quit date: 01/27/1996    Years since quitting: 22.9  . Smokeless tobacco: Never Used  Substance and Sexual Activity  . Alcohol use: Yes    Comment: 4 beers weekly  . Drug use: No  . Sexual activity: Not on file  Other Topics Concern  . Not on file  Social History Narrative  . Not on file   Social Determinants of Health   Financial Resource Strain:   . Difficulty of Paying Living Expenses: Not on file  Food Insecurity:   . Worried About Charity fundraiser in the Last Year: Not on file  . Ran Out of Food in the Last Year: Not on file  Transportation Needs:   . Lack of Transportation (Medical): Not on file  . Lack of Transportation (Non-Medical): Not on file  Physical Activity:   . Days of Exercise per Week: Not on file  . Minutes of Exercise per Session: Not on file  Stress:   . Feeling of Stress : Not on file  Social Connections:   . Frequency of Communication with Friends and Family: Not on file  . Frequency of Social Gatherings with Friends and Family: Not on file  . Attends Religious Services: Not on file  . Active Member of Clubs or Organizations: Not on file  . Attends Archivist Meetings: Not on file  . Marital Status: Not on file  Intimate Partner Violence:   . Fear of Current or Ex-Partner: Not on file  . Emotionally Abused: Not on file  . Physically Abused: Not on file  . Sexually Abused: Not on file    Past Medical History, Surgical history, Social history, and Family history were reviewed and updated as appropriate.   Please see review of systems for further details on the patient's review from today.   Objective:   Physical Exam:  There were no vitals taken  for this visit.  Physical Exam Neurological:     Mental Status: He is alert and oriented to person, place, and time.     Cranial Nerves: No dysarthria.  Psychiatric:        Attention and Perception: Attention normal.        Mood and Affect: Mood is not anxious or depressed.        Speech: Speech normal.        Behavior: Behavior is cooperative.        Thought Content: Thought content normal. Thought content is not paranoid or delusional. Thought content  does not include homicidal or suicidal ideation. Thought content does not include homicidal or suicidal plan.        Cognition and Memory: Cognition and memory normal.        Judgment: Judgment normal.     Comments: Insight fair.       Lab Review:     Component Value Date/Time   NA 137 08/10/2016 1202   K 4.1 08/10/2016 1202   CL 106 08/10/2016 1202   CO2 24 08/10/2016 1202   GLUCOSE 138 (H) 08/10/2016 1202   BUN 9 08/10/2016 1202   CREATININE 0.90 08/10/2016 1202   CALCIUM 9.1 08/10/2016 1202   PROT 6.6 12/06/2016 0812   ALBUMIN 4.4 12/06/2016 0812   AST 24 12/06/2016 0812   ALT 21 12/06/2016 0812   ALKPHOS 77 12/06/2016 0812   BILITOT 0.6 12/06/2016 0812   GFRNONAA >60 08/10/2016 1202   GFRAA >60 08/10/2016 1202       Component Value Date/Time   WBC 6.3 08/10/2016 1202   RBC 4.31 08/10/2016 1202   HGB 14.3 08/10/2016 1202   HCT 39.2 08/10/2016 1202   PLT 114 (L) 08/10/2016 1202   MCV 91.0 08/10/2016 1202   MCH 33.2 08/10/2016 1202   MCHC 36.5 (H) 08/10/2016 1202   RDW 13.4 08/10/2016 1202    No results found for: POCLITH, LITHIUM   No results found for: PHENYTOIN, PHENOBARB, VALPROATE, CBMZ   .res Assessment: Plan:    Colin Villa was seen today for follow-up, anxiety and depression.  Diagnoses and all orders for this visit:  Major depressive disorder, recurrent episode, moderate (HCC)  Generalized anxiety disorder  Attention deficit hyperactivity disorder (ADHD), predominantly inattentive  type  Relationship problem between partners   Repeated to the patient and his wife that I have nothing further to offer regarding her chronic complaint about his anger.  Again he has no anger problems in any other context except the relationship with his wife.  There is chronic dysfunction in that relationship which is been identified by previous therapists as well as myself.  This is not a fixable problem with medication or medication changes.  Strongly encouraged him to get a second opinion from another psychiatrist but that I had done the very best I could do to help with her concerns about his anger.  No med changes today.  Follow-up as planned in a few months regarding his other medications and depression.  Lynder Parents, MD, DFAPA   Future Appointments  Date Time Provider [place]  01/12/2019  3:00 PM [name], PhD CP-CP None    No orders of the defined types were placed in this encounter.     -------------------------------

## 2019-01-12 ENCOUNTER — Ambulatory Visit (INDEPENDENT_AMBULATORY_CARE_PROVIDER_SITE_OTHER): Payer: Medicare Other | Admitting: Psychiatry

## 2019-01-12 ENCOUNTER — Other Ambulatory Visit: Payer: Self-pay

## 2019-01-12 DIAGNOSIS — F411 Generalized anxiety disorder: Secondary | ICD-10-CM

## 2019-01-12 DIAGNOSIS — F900 Attention-deficit hyperactivity disorder, predominantly inattentive type: Secondary | ICD-10-CM | POA: Diagnosis not present

## 2019-01-12 DIAGNOSIS — F331 Major depressive disorder, recurrent, moderate: Secondary | ICD-10-CM

## 2019-01-12 DIAGNOSIS — Z630 Problems in relationship with spouse or partner: Secondary | ICD-10-CM | POA: Diagnosis not present

## 2019-01-12 NOTE — Progress Notes (Signed)
Psychotherapy Progress Note [facility] Colin Moore, PhD LP  Patient ID: Colin Villa     MRN: [id number]     Therapy format: Family therapy w/ patient -- accompanied by wife, Colin Villa Date: 01/12/2019      Start: 3:14p     Stop: 4:02p     Time Spent: 48 min Location: In-person   Session narrative (presenting needs, interim history, self-report of stressors and symptoms, applications of prior therapy, status changes, and interventions made in session) Frustrating med check last week.  Had felt depression under control, but sincerely wanted to address episodic anger outbursts, and says he asked Dr. Clovis Villa for medicine-based help with anger.  Reports he cleared it ahead of time to have wife come with him, but Dr. Clovis Villa accused her of "sneaking" into the meeting, told both that there was nothing medication could do for anger issues, and, according to both, showed them the door in a way that both took to mean termination.  Encouraged to clarify, as notes from the med check do indicate wife attending without Dr. Casimiro Villa consent but also frame PT following up in a few months.    At least at this time -- and despite a long history of intermittent marital conflict, which has repeatedly come to brief separations -- the couple seem both amicable and united in pursuit of addressing PT's anger outbursts.  Discussed how that was the case in August, when the two of them returned with the same agenda after a period of prolonged separation.  Encouraged each be steadfastly open to addressing their own contributions to conflict.    On request, interpreted PT's anger outbursts as rooted in childhood experience of sustained emotional tension helplessly witnessing the irrational conflict and suspicion that his schizophrenic mother created, notably with chronic accusations of infidelity against his father.  Reasoned that this was learned helplessness experience, and only choices dealing with tension were  to bottle or blow, and he learned that leaving home was the sure way to get relief from how family strife came to feel.  This pattern has been reenacted over a long period of time in relation to Colin Villa, who, though not schizophrenic, is objectively capable of inflicting tension by overtalking and flooding their interactions with information, issues, and concerns in a way that PT's deep emotional brain may not be easily able to distinguish, hence his alarmed feelings and susceptibility to overreact.  Encouraged that, as he learns "third ways" and finds he can successfully manage the sense of pressure he feels in conflict, this pattern can calm down, but it also requires Colin Villa to slow down, simplify, and intentionally temper how much information and reaction she brings to the moment.  In effect, she has to distinguish herself better from [facility] mother if she wants to be taken more reliably as friendly.  Both seemed to agree with the assessment.  Did not address whether medication could be of some help, still, but presumably Paxil would be having some slowing effect on fight/flight reactions, and his benzodiazepine and BP medications as well.  Side narrative from Colin Villa about son Colin Villa and his divorce, other family matters that became peppered with allusions to hurts she has with Colin Villa, and then with Colin Villa, including .  Confronted in session how asides like these may relieve her momentarily but they can, and maybe should, be taken as digs; if she wants to help change the tone, she needs to be much more mindful of what she says in run-on  speeches so as to leave her husband some dignity to work with.    Given the assessment that PT has a form of ADD, or at least a working memory limitation (attributable to development, anxiety, and perhaps also to cancer treatment and some medication), noted also that it is very important not to overwhelm him with information and give him a chance to check perceptions, even when no upset is  suggested, because "too much to process" is also a component of his mother experience.  Left open whether PT will seek another psychiatrist or try to resume with Dr. Clovis Villa, but it is wife's understanding that she has been identified as "the problem".    Therapeutic modalities: Cognitive Behavioral Therapy, Solution-Oriented/Positive Psychology, Psycho-education/Bibliotherapy and Interpersonal  Mental Status/Observations:  Appearance:   Casual     Behavior:  Appropriate  Motor:  Normal  Speech/Language:   Clear and Coherent  Affect:  Appropriate  Mood:  concerned  Thought process:  normal  Thought content:    WNL  Sensory/Perceptual disturbances:    WNL  Orientation:  Fully oriented  Attention:  Good  Concentration:  Good  Memory:  grossly intact  Insight:    Fair  Judgment:   Good  Impulse Control:  Good   Risk Assessment: Danger to Self: No Self-injurious Behavior: No Danger to Others: No Physical Aggression / Violence: No Duty to Warn: No Access to Firearms a concern: No  Assessment of progress:  stabilized  Diagnosis:   ICD-10-CM   1. Major depressive disorder, recurrent episode, moderate (HCC)  F33.1   2. Generalized anxiety disorder  F41.1   3. Attention deficit hyperactivity disorder (ADHD), predominantly inattentive type  F90.0   4. Relationship problem between partners  Z63.0    Plan:   . PT practice noticing rising anxiety/irritability and signal time out . W practice noticing and preventing overloaded monologues . Other recommendations/advice as noted above . Continue to utilize previously learned skills ad lib . Maintain medication as prescribed and work faithfully with relevant prescriber(s) if any changes are desired or seem indicated . Call the clinic on-call service, present to ER, or call 911 if any life-threatening psychiatric crisis Return in about 2 weeks (around 01/26/2019) for time as available. Current [facility] appointments: Future Appointments   Date Time Provider [place]  02/16/2019  3:00 PM Colin Serve, PhD CP-CP None    Colin Serve, PhD Colin Moore, PhD LP Clinical Psychologist, [facility] [address] [place], [place] [address] [phone]

## 2019-01-15 DIAGNOSIS — Z1159 Encounter for screening for other viral diseases: Secondary | ICD-10-CM | POA: Diagnosis not present

## 2019-01-19 DIAGNOSIS — D125 Benign neoplasm of sigmoid colon: Secondary | ICD-10-CM | POA: Diagnosis not present

## 2019-01-19 DIAGNOSIS — K52831 Collagenous colitis: Secondary | ICD-10-CM | POA: Diagnosis not present

## 2019-01-19 DIAGNOSIS — D123 Benign neoplasm of transverse colon: Secondary | ICD-10-CM | POA: Diagnosis not present

## 2019-01-19 DIAGNOSIS — Z86010 Personal history of colonic polyps: Secondary | ICD-10-CM | POA: Diagnosis not present

## 2019-01-22 DIAGNOSIS — I10 Essential (primary) hypertension: Secondary | ICD-10-CM | POA: Diagnosis not present

## 2019-01-22 DIAGNOSIS — I480 Paroxysmal atrial fibrillation: Secondary | ICD-10-CM | POA: Diagnosis not present

## 2019-01-22 DIAGNOSIS — I2510 Atherosclerotic heart disease of native coronary artery without angina pectoris: Secondary | ICD-10-CM | POA: Diagnosis not present

## 2019-01-23 DIAGNOSIS — D123 Benign neoplasm of transverse colon: Secondary | ICD-10-CM | POA: Diagnosis not present

## 2019-01-23 DIAGNOSIS — D125 Benign neoplasm of sigmoid colon: Secondary | ICD-10-CM | POA: Diagnosis not present

## 2019-01-23 DIAGNOSIS — K52831 Collagenous colitis: Secondary | ICD-10-CM | POA: Diagnosis not present

## 2019-02-05 ENCOUNTER — Ambulatory Visit: Payer: Medicare Other

## 2019-02-10 DIAGNOSIS — K529 Noninfective gastroenteritis and colitis, unspecified: Secondary | ICD-10-CM | POA: Diagnosis not present

## 2019-02-10 DIAGNOSIS — D500 Iron deficiency anemia secondary to blood loss (chronic): Secondary | ICD-10-CM | POA: Diagnosis not present

## 2019-02-16 ENCOUNTER — Ambulatory Visit: Payer: Medicare Other

## 2019-02-16 ENCOUNTER — Ambulatory Visit (INDEPENDENT_AMBULATORY_CARE_PROVIDER_SITE_OTHER): Payer: Medicare Other | Admitting: Psychiatry

## 2019-02-16 ENCOUNTER — Other Ambulatory Visit: Payer: Self-pay

## 2019-02-16 DIAGNOSIS — F900 Attention-deficit hyperactivity disorder, predominantly inattentive type: Secondary | ICD-10-CM | POA: Diagnosis not present

## 2019-02-16 DIAGNOSIS — Z8501 Personal history of malignant neoplasm of esophagus: Secondary | ICD-10-CM | POA: Diagnosis not present

## 2019-02-16 DIAGNOSIS — F411 Generalized anxiety disorder: Secondary | ICD-10-CM | POA: Diagnosis not present

## 2019-02-16 DIAGNOSIS — Z630 Problems in relationship with spouse or partner: Secondary | ICD-10-CM

## 2019-02-16 DIAGNOSIS — F331 Major depressive disorder, recurrent, moderate: Secondary | ICD-10-CM

## 2019-02-16 NOTE — Progress Notes (Signed)
Psychotherapy Progress Note [facility] Luan Moore, PhD LP  Patient ID: NINA MASHAK     MRN: [id number] Therapy format: Family therapy w/ patient -- accompanied by wife [name] Date: 02/16/2019      Start: 3:15p     Stop: 4:05p     Time Spent: 50 min Location: In-person   Session narrative (presenting needs, interim history, self-report of stressors and symptoms, applications of prior therapy, status changes, and interventions made in session) Have had some more conflicts past month, 2 times leaving the house to sleep elsewhere.  One where he just took a walk and came back.  [name] did try offering 30-minute timeout sometime but says it "didn't work".  Clarified the purposes and practices of collaborative time out.    Both feeling right now like they can't cohabitate effectively.   Amicable enough in the room, but seemingly agreed not to live together at this point.  Affirmed agreement and probed wishes both ways.  PT asks for referral to another psychiatrist -- he understood that Dr. Clovis Pu had terminated him when he reacted to bringing [facility].  Educated otherwise, but PT does want 2nd opinion anyway, to address his capacity to anger.  Offered [facility] and [facility] as likely places to dovetail his medical history, have large networks to suit wherever he ends up living, and perhaps have deeper understanding of any interplay between cancer, heart, and emotional reactivity.  PT asked to recover more of what was offered last session, reviewed theory of adverse childhood events (mother's paranoid schiz, father's militaristic ways) and the emotional conditioning PT went through as explanation for his hyperreactivity to [facility] emotionality and to any sense that he is about to be engulfed, a feeling she is adept at creating through run-on speech and wide-ranging analysis of him to his face.  W admits she can overtalk things and wants to be in control enough of the habit to keep from making thing worse.  At the same time, she feels mistrusting of PT's management of money and his temper and does not want to risk much.    In the end, PT and W have a form of a partnership based on legacies and shared time in life, and the most salient part right now is getting through projects that will liquidate unused real estate and hopefully establish two securities.   Resolved for couple to practice taking projects apart as much as able and being concrete and specific who will do what.  Other practice to be explicit in pledges and expectations, particularly PT prompt in stating when he is not signing onto suggested works and diligent about those things he does agree to.  Both commit to remain honest and transparent about their resources and use of them while enacting separation with commingled assets.  Therapeutic modalities: Cognitive Behavioral Therapy and Solution-Oriented/Positive Psychology  Mental Status/Observations:  Appearance:   Casual     Behavior:  Appropriate  Motor:  Normal  Speech/Language:   Clear and Coherent  Affect:  Appropriate  Mood:  more settled, less anxious  Thought process:  grossly intact  Thought content:    WNL  Sensory/Perceptual disturbances:    WNL  Orientation:  Fully oriented  Attention:  Good  Concentration:  Fair  Memory:  fuzzy  Insight:    Fair  Judgment:   Good  Impulse Control:  Fair   Risk Assessment: Danger to Self: No Self-injurious Behavior: No Danger to Others: No Physical Aggression / Violence: No Duty  to Warn: No Access to Firearms a concern: No  Assessment of progress:  stabilized  Diagnosis:   ICD-10-CM   1. Major depressive disorder, recurrent episode, moderate (HCC)  F33.1   2. Generalized anxiety disorder  F41.1   3. Attention deficit hyperactivity disorder (ADHD), predominantly inattentive type  F90.0   4. Relationship problem between partners  Z63.0   5. Personal history of esophageal cancer  Z85.01    Plan:  . Practice amicable,  honest, explicit dealings over shared business concerns, emphasizing o turn-taking o keeping to the subject o smaller packages of information and instructions for PT o verify listening and actual agreement to minimize frustration and judgment . Other recommendations/advice as may be noted above . Continue to utilize previously learned skills ad lib . Maintain medication as prescribed and work faithfully with relevant prescriber(s) if any changes are desired or seem indicated . Call the clinic on-call service, present to ER, or call 911 if any life-threatening psychiatric crisis . No follow-ups on file.Marland Kitchen  Next scheduled visit in this office Visit date not found.  [name], PhD Luan Moore, PhD LP Clinical Psychologist, [facility] [address] [place], [place] [address] [phone]

## 2019-02-18 DIAGNOSIS — C154 Malignant neoplasm of middle third of esophagus: Secondary | ICD-10-CM | POA: Diagnosis not present

## 2019-02-18 DIAGNOSIS — R197 Diarrhea, unspecified: Secondary | ICD-10-CM | POA: Diagnosis not present

## 2019-02-18 DIAGNOSIS — R490 Dysphonia: Secondary | ICD-10-CM | POA: Diagnosis not present

## 2019-03-02 DIAGNOSIS — R490 Dysphonia: Secondary | ICD-10-CM | POA: Diagnosis not present

## 2019-03-02 DIAGNOSIS — J3801 Paralysis of vocal cords and larynx, unilateral: Secondary | ICD-10-CM

## 2019-03-02 DIAGNOSIS — C154 Malignant neoplasm of middle third of esophagus: Secondary | ICD-10-CM | POA: Diagnosis not present

## 2019-03-02 HISTORY — DX: Paralysis of vocal cords and larynx, unilateral: J38.01

## 2019-03-09 DIAGNOSIS — C154 Malignant neoplasm of middle third of esophagus: Secondary | ICD-10-CM | POA: Diagnosis not present

## 2019-03-09 DIAGNOSIS — C159 Malignant neoplasm of esophagus, unspecified: Secondary | ICD-10-CM | POA: Diagnosis not present

## 2019-03-09 DIAGNOSIS — G629 Polyneuropathy, unspecified: Secondary | ICD-10-CM | POA: Diagnosis not present

## 2019-03-09 DIAGNOSIS — J984 Other disorders of lung: Secondary | ICD-10-CM | POA: Diagnosis not present

## 2019-03-09 DIAGNOSIS — R161 Splenomegaly, not elsewhere classified: Secondary | ICD-10-CM | POA: Diagnosis not present

## 2019-03-09 DIAGNOSIS — J3801 Paralysis of vocal cords and larynx, unilateral: Secondary | ICD-10-CM | POA: Diagnosis not present

## 2019-03-10 ENCOUNTER — Ambulatory Visit: Payer: Medicare Other | Admitting: Psychiatry

## 2019-03-10 ENCOUNTER — Encounter: Payer: Self-pay | Admitting: Psychiatry

## 2019-03-10 NOTE — Progress Notes (Addendum)
Admin note for non-service contact  Patient ID: Colin Villa  MRN: [id number] DATE: 03/10/2019  PT noted to be scheduled for today and have cancelled.  Check of appointment schedule shows no appointments with any provider, including Dr. Clovis Pu, whom PT was informed last session (2/8) is still available to him.  Presume PT is withdrawing from treatment, but will remain available on request.  Addendum, 03/15/19 -- EHR notes further evaluation of throat issues with oncology at [facility] on Thursday, 3/4, with possible recurrence of esophageal cancer and more tests scheduled.  PT noted to be in c/o wife when seen there, suggesting more amicable dealings.  PT remains welcome, at his own discretion, both with [facility] and [facility].  [name], PhD Luan Moore, PhD LP Clinical Psychologist, [facility] [address], [place], [place] [address] [phone]

## 2019-03-12 DIAGNOSIS — C154 Malignant neoplasm of middle third of esophagus: Secondary | ICD-10-CM | POA: Diagnosis not present

## 2019-03-20 DIAGNOSIS — R197 Diarrhea, unspecified: Secondary | ICD-10-CM | POA: Diagnosis not present

## 2019-03-20 DIAGNOSIS — Z9221 Personal history of antineoplastic chemotherapy: Secondary | ICD-10-CM | POA: Diagnosis not present

## 2019-03-20 DIAGNOSIS — I2510 Atherosclerotic heart disease of native coronary artery without angina pectoris: Secondary | ICD-10-CM | POA: Diagnosis not present

## 2019-03-20 DIAGNOSIS — C7889 Secondary malignant neoplasm of other digestive organs: Secondary | ICD-10-CM | POA: Diagnosis not present

## 2019-03-20 DIAGNOSIS — Z9889 Other specified postprocedural states: Secondary | ICD-10-CM | POA: Diagnosis not present

## 2019-03-20 DIAGNOSIS — C155 Malignant neoplasm of lower third of esophagus: Secondary | ICD-10-CM | POA: Diagnosis not present

## 2019-03-20 DIAGNOSIS — Z923 Personal history of irradiation: Secondary | ICD-10-CM | POA: Diagnosis not present

## 2019-03-20 DIAGNOSIS — Z87891 Personal history of nicotine dependence: Secondary | ICD-10-CM | POA: Diagnosis not present

## 2019-03-20 DIAGNOSIS — I480 Paroxysmal atrial fibrillation: Secondary | ICD-10-CM | POA: Diagnosis not present

## 2019-03-20 DIAGNOSIS — I10 Essential (primary) hypertension: Secondary | ICD-10-CM | POA: Diagnosis not present

## 2019-03-20 DIAGNOSIS — R490 Dysphonia: Secondary | ICD-10-CM | POA: Diagnosis not present

## 2019-03-20 DIAGNOSIS — I4892 Unspecified atrial flutter: Secondary | ICD-10-CM | POA: Diagnosis not present

## 2019-03-20 DIAGNOSIS — C154 Malignant neoplasm of middle third of esophagus: Secondary | ICD-10-CM | POA: Diagnosis not present

## 2019-03-27 DIAGNOSIS — I4892 Unspecified atrial flutter: Secondary | ICD-10-CM | POA: Diagnosis not present

## 2019-03-27 DIAGNOSIS — Z79899 Other long term (current) drug therapy: Secondary | ICD-10-CM | POA: Diagnosis not present

## 2019-03-27 DIAGNOSIS — Z7982 Long term (current) use of aspirin: Secondary | ICD-10-CM | POA: Diagnosis not present

## 2019-03-27 DIAGNOSIS — D61818 Other pancytopenia: Secondary | ICD-10-CM | POA: Diagnosis not present

## 2019-03-27 DIAGNOSIS — I480 Paroxysmal atrial fibrillation: Secondary | ICD-10-CM | POA: Diagnosis not present

## 2019-03-27 DIAGNOSIS — I2510 Atherosclerotic heart disease of native coronary artery without angina pectoris: Secondary | ICD-10-CM | POA: Diagnosis not present

## 2019-03-27 DIAGNOSIS — R7989 Other specified abnormal findings of blood chemistry: Secondary | ICD-10-CM | POA: Diagnosis not present

## 2019-03-27 DIAGNOSIS — R590 Localized enlarged lymph nodes: Secondary | ICD-10-CM | POA: Diagnosis not present

## 2019-03-27 DIAGNOSIS — Z20822 Contact with and (suspected) exposure to covid-19: Secondary | ICD-10-CM | POA: Diagnosis not present

## 2019-03-27 DIAGNOSIS — C154 Malignant neoplasm of middle third of esophagus: Secondary | ICD-10-CM | POA: Diagnosis not present

## 2019-03-27 DIAGNOSIS — I10 Essential (primary) hypertension: Secondary | ICD-10-CM | POA: Diagnosis not present

## 2019-03-27 DIAGNOSIS — Z87891 Personal history of nicotine dependence: Secondary | ICD-10-CM | POA: Diagnosis not present

## 2019-04-07 DIAGNOSIS — D61818 Other pancytopenia: Secondary | ICD-10-CM | POA: Diagnosis not present

## 2019-04-07 DIAGNOSIS — C154 Malignant neoplasm of middle third of esophagus: Secondary | ICD-10-CM | POA: Diagnosis not present

## 2019-04-07 DIAGNOSIS — I309 Acute pericarditis, unspecified: Secondary | ICD-10-CM | POA: Diagnosis not present

## 2019-04-07 DIAGNOSIS — Z01818 Encounter for other preprocedural examination: Secondary | ICD-10-CM | POA: Diagnosis not present

## 2019-04-07 DIAGNOSIS — I10 Essential (primary) hypertension: Secondary | ICD-10-CM | POA: Diagnosis not present

## 2019-04-07 DIAGNOSIS — I4892 Unspecified atrial flutter: Secondary | ICD-10-CM | POA: Diagnosis not present

## 2019-04-07 DIAGNOSIS — R778 Other specified abnormalities of plasma proteins: Secondary | ICD-10-CM | POA: Diagnosis not present

## 2019-04-07 DIAGNOSIS — I2510 Atherosclerotic heart disease of native coronary artery without angina pectoris: Secondary | ICD-10-CM | POA: Diagnosis not present

## 2019-04-07 DIAGNOSIS — F329 Major depressive disorder, single episode, unspecified: Secondary | ICD-10-CM | POA: Diagnosis not present

## 2019-04-07 DIAGNOSIS — I480 Paroxysmal atrial fibrillation: Secondary | ICD-10-CM | POA: Diagnosis not present

## 2019-04-08 DIAGNOSIS — E782 Mixed hyperlipidemia: Secondary | ICD-10-CM | POA: Diagnosis not present

## 2019-04-08 DIAGNOSIS — I10 Essential (primary) hypertension: Secondary | ICD-10-CM | POA: Diagnosis not present

## 2019-04-08 DIAGNOSIS — F329 Major depressive disorder, single episode, unspecified: Secondary | ICD-10-CM | POA: Diagnosis not present

## 2019-04-08 DIAGNOSIS — N401 Enlarged prostate with lower urinary tract symptoms: Secondary | ICD-10-CM | POA: Diagnosis not present

## 2019-04-08 DIAGNOSIS — I2510 Atherosclerotic heart disease of native coronary artery without angina pectoris: Secondary | ICD-10-CM | POA: Diagnosis not present

## 2019-04-08 DIAGNOSIS — I4891 Unspecified atrial fibrillation: Secondary | ICD-10-CM | POA: Diagnosis not present

## 2019-04-08 DIAGNOSIS — M1990 Unspecified osteoarthritis, unspecified site: Secondary | ICD-10-CM | POA: Diagnosis not present

## 2019-04-08 DIAGNOSIS — D500 Iron deficiency anemia secondary to blood loss (chronic): Secondary | ICD-10-CM | POA: Diagnosis not present

## 2019-04-09 ENCOUNTER — Other Ambulatory Visit: Payer: Self-pay

## 2019-04-09 MED ORDER — ROSUVASTATIN CALCIUM 40 MG PO TABS: 40.0000 mg | ORAL_TABLET | Freq: Every day | ORAL | 0 refills | Status: AC

## 2019-04-09 NOTE — Telephone Encounter (Signed)
Pt's medication was sent to pt's pharmacy as requested. Confirmation received.  °

## 2019-04-11 DIAGNOSIS — C154 Malignant neoplasm of middle third of esophagus: Secondary | ICD-10-CM | POA: Diagnosis not present

## 2019-04-11 DIAGNOSIS — Z20822 Contact with and (suspected) exposure to covid-19: Secondary | ICD-10-CM | POA: Diagnosis not present

## 2019-04-13 DIAGNOSIS — Z85828 Personal history of other malignant neoplasm of skin: Secondary | ICD-10-CM | POA: Diagnosis not present

## 2019-04-13 DIAGNOSIS — D649 Anemia, unspecified: Secondary | ICD-10-CM | POA: Diagnosis not present

## 2019-04-13 DIAGNOSIS — I480 Paroxysmal atrial fibrillation: Secondary | ICD-10-CM | POA: Diagnosis not present

## 2019-04-13 DIAGNOSIS — R7303 Prediabetes: Secondary | ICD-10-CM | POA: Diagnosis not present

## 2019-04-13 DIAGNOSIS — Z96652 Presence of left artificial knee joint: Secondary | ICD-10-CM | POA: Diagnosis not present

## 2019-04-13 DIAGNOSIS — I2510 Atherosclerotic heart disease of native coronary artery without angina pectoris: Secondary | ICD-10-CM | POA: Diagnosis not present

## 2019-04-13 DIAGNOSIS — I10 Essential (primary) hypertension: Secondary | ICD-10-CM | POA: Diagnosis not present

## 2019-04-13 DIAGNOSIS — D61818 Other pancytopenia: Secondary | ICD-10-CM | POA: Diagnosis not present

## 2019-04-13 DIAGNOSIS — R768 Other specified abnormal immunological findings in serum: Secondary | ICD-10-CM | POA: Diagnosis not present

## 2019-04-13 DIAGNOSIS — Z7952 Long term (current) use of systemic steroids: Secondary | ICD-10-CM | POA: Diagnosis not present

## 2019-04-13 DIAGNOSIS — D696 Thrombocytopenia, unspecified: Secondary | ICD-10-CM | POA: Diagnosis not present

## 2019-04-13 DIAGNOSIS — C771 Secondary and unspecified malignant neoplasm of intrathoracic lymph nodes: Secondary | ICD-10-CM | POA: Diagnosis not present

## 2019-04-13 DIAGNOSIS — C154 Malignant neoplasm of middle third of esophagus: Secondary | ICD-10-CM | POA: Diagnosis not present

## 2019-04-13 DIAGNOSIS — Z923 Personal history of irradiation: Secondary | ICD-10-CM | POA: Diagnosis not present

## 2019-04-13 DIAGNOSIS — F419 Anxiety disorder, unspecified: Secondary | ICD-10-CM | POA: Diagnosis not present

## 2019-04-13 DIAGNOSIS — C770 Secondary and unspecified malignant neoplasm of lymph nodes of head, face and neck: Secondary | ICD-10-CM | POA: Diagnosis not present

## 2019-04-13 DIAGNOSIS — R7989 Other specified abnormal findings of blood chemistry: Secondary | ICD-10-CM | POA: Diagnosis not present

## 2019-04-13 DIAGNOSIS — Z9221 Personal history of antineoplastic chemotherapy: Secondary | ICD-10-CM | POA: Diagnosis not present

## 2019-04-13 DIAGNOSIS — N400 Enlarged prostate without lower urinary tract symptoms: Secondary | ICD-10-CM | POA: Diagnosis not present

## 2019-04-13 DIAGNOSIS — Z7982 Long term (current) use of aspirin: Secondary | ICD-10-CM | POA: Diagnosis not present

## 2019-04-13 DIAGNOSIS — I351 Nonrheumatic aortic (valve) insufficiency: Secondary | ICD-10-CM | POA: Diagnosis not present

## 2019-04-13 DIAGNOSIS — Z79899 Other long term (current) drug therapy: Secondary | ICD-10-CM | POA: Diagnosis not present

## 2019-04-13 DIAGNOSIS — F329 Major depressive disorder, single episode, unspecified: Secondary | ICD-10-CM | POA: Diagnosis not present

## 2019-04-13 DIAGNOSIS — I4892 Unspecified atrial flutter: Secondary | ICD-10-CM | POA: Diagnosis not present

## 2019-04-13 DIAGNOSIS — Z8501 Personal history of malignant neoplasm of esophagus: Secondary | ICD-10-CM | POA: Diagnosis not present

## 2019-04-13 DIAGNOSIS — Z87891 Personal history of nicotine dependence: Secondary | ICD-10-CM | POA: Diagnosis not present

## 2019-04-17 DIAGNOSIS — C155 Malignant neoplasm of lower third of esophagus: Secondary | ICD-10-CM | POA: Diagnosis not present

## 2019-04-17 DIAGNOSIS — F17210 Nicotine dependence, cigarettes, uncomplicated: Secondary | ICD-10-CM | POA: Diagnosis not present

## 2019-04-17 DIAGNOSIS — C159 Malignant neoplasm of esophagus, unspecified: Secondary | ICD-10-CM | POA: Diagnosis not present

## 2019-04-27 DIAGNOSIS — C159 Malignant neoplasm of esophagus, unspecified: Secondary | ICD-10-CM | POA: Diagnosis not present

## 2019-04-28 DIAGNOSIS — C7931 Secondary malignant neoplasm of brain: Secondary | ICD-10-CM | POA: Diagnosis not present

## 2019-04-28 DIAGNOSIS — C159 Malignant neoplasm of esophagus, unspecified: Secondary | ICD-10-CM | POA: Diagnosis not present

## 2019-04-28 DIAGNOSIS — Z20822 Contact with and (suspected) exposure to covid-19: Secondary | ICD-10-CM | POA: Diagnosis not present

## 2019-04-29 DIAGNOSIS — C154 Malignant neoplasm of middle third of esophagus: Secondary | ICD-10-CM | POA: Diagnosis not present

## 2019-04-29 DIAGNOSIS — C155 Malignant neoplasm of lower third of esophagus: Secondary | ICD-10-CM | POA: Diagnosis not present

## 2019-04-30 DIAGNOSIS — D696 Thrombocytopenia, unspecified: Secondary | ICD-10-CM | POA: Diagnosis not present

## 2019-04-30 DIAGNOSIS — C154 Malignant neoplasm of middle third of esophagus: Secondary | ICD-10-CM | POA: Diagnosis not present

## 2019-04-30 DIAGNOSIS — F329 Major depressive disorder, single episode, unspecified: Secondary | ICD-10-CM | POA: Diagnosis not present

## 2019-04-30 DIAGNOSIS — R0602 Shortness of breath: Secondary | ICD-10-CM | POA: Diagnosis not present

## 2019-04-30 DIAGNOSIS — Z5111 Encounter for antineoplastic chemotherapy: Secondary | ICD-10-CM | POA: Diagnosis not present

## 2019-04-30 DIAGNOSIS — C779 Secondary and unspecified malignant neoplasm of lymph node, unspecified: Secondary | ICD-10-CM | POA: Diagnosis not present

## 2019-04-30 DIAGNOSIS — C155 Malignant neoplasm of lower third of esophagus: Secondary | ICD-10-CM | POA: Diagnosis not present

## 2019-04-30 DIAGNOSIS — R490 Dysphonia: Secondary | ICD-10-CM | POA: Diagnosis not present

## 2019-04-30 DIAGNOSIS — Z87891 Personal history of nicotine dependence: Secondary | ICD-10-CM | POA: Diagnosis not present

## 2019-05-01 DIAGNOSIS — C155 Malignant neoplasm of lower third of esophagus: Secondary | ICD-10-CM | POA: Diagnosis not present

## 2019-05-04 DIAGNOSIS — C155 Malignant neoplasm of lower third of esophagus: Secondary | ICD-10-CM | POA: Diagnosis not present

## 2019-05-04 DIAGNOSIS — C159 Malignant neoplasm of esophagus, unspecified: Secondary | ICD-10-CM | POA: Diagnosis not present

## 2019-05-04 DIAGNOSIS — R590 Localized enlarged lymph nodes: Secondary | ICD-10-CM | POA: Diagnosis not present

## 2019-05-04 DIAGNOSIS — Z510 Encounter for antineoplastic radiation therapy: Secondary | ICD-10-CM | POA: Diagnosis not present

## 2019-05-05 DIAGNOSIS — C159 Malignant neoplasm of esophagus, unspecified: Secondary | ICD-10-CM | POA: Diagnosis not present

## 2019-05-06 DIAGNOSIS — C154 Malignant neoplasm of middle third of esophagus: Secondary | ICD-10-CM | POA: Diagnosis not present

## 2019-05-06 DIAGNOSIS — C159 Malignant neoplasm of esophagus, unspecified: Secondary | ICD-10-CM | POA: Diagnosis not present

## 2019-05-07 DIAGNOSIS — C159 Malignant neoplasm of esophagus, unspecified: Secondary | ICD-10-CM | POA: Diagnosis not present

## 2019-05-08 DIAGNOSIS — Z87891 Personal history of nicotine dependence: Secondary | ICD-10-CM | POA: Diagnosis not present

## 2019-05-08 DIAGNOSIS — Z5111 Encounter for antineoplastic chemotherapy: Secondary | ICD-10-CM | POA: Diagnosis not present

## 2019-05-08 DIAGNOSIS — R490 Dysphonia: Secondary | ICD-10-CM | POA: Diagnosis not present

## 2019-05-08 DIAGNOSIS — F329 Major depressive disorder, single episode, unspecified: Secondary | ICD-10-CM | POA: Diagnosis not present

## 2019-05-08 DIAGNOSIS — C159 Malignant neoplasm of esophagus, unspecified: Secondary | ICD-10-CM | POA: Diagnosis not present

## 2019-05-08 DIAGNOSIS — C154 Malignant neoplasm of middle third of esophagus: Secondary | ICD-10-CM | POA: Diagnosis not present

## 2019-05-08 DIAGNOSIS — I4892 Unspecified atrial flutter: Secondary | ICD-10-CM | POA: Diagnosis not present

## 2019-05-08 DIAGNOSIS — D696 Thrombocytopenia, unspecified: Secondary | ICD-10-CM | POA: Diagnosis not present

## 2019-05-08 DIAGNOSIS — Z510 Encounter for antineoplastic radiation therapy: Secondary | ICD-10-CM | POA: Diagnosis not present

## 2019-05-08 DIAGNOSIS — R0789 Other chest pain: Secondary | ICD-10-CM | POA: Diagnosis not present

## 2019-05-08 DIAGNOSIS — R202 Paresthesia of skin: Secondary | ICD-10-CM | POA: Diagnosis not present

## 2019-05-11 DIAGNOSIS — C159 Malignant neoplasm of esophagus, unspecified: Secondary | ICD-10-CM | POA: Diagnosis not present

## 2019-05-12 DIAGNOSIS — C159 Malignant neoplasm of esophagus, unspecified: Secondary | ICD-10-CM | POA: Diagnosis not present

## 2019-05-13 DIAGNOSIS — C154 Malignant neoplasm of middle third of esophagus: Secondary | ICD-10-CM | POA: Diagnosis not present

## 2019-05-13 DIAGNOSIS — C159 Malignant neoplasm of esophagus, unspecified: Secondary | ICD-10-CM | POA: Diagnosis not present

## 2019-05-14 DIAGNOSIS — F329 Major depressive disorder, single episode, unspecified: Secondary | ICD-10-CM | POA: Diagnosis not present

## 2019-05-14 DIAGNOSIS — I4892 Unspecified atrial flutter: Secondary | ICD-10-CM | POA: Diagnosis not present

## 2019-05-14 DIAGNOSIS — Z510 Encounter for antineoplastic radiation therapy: Secondary | ICD-10-CM | POA: Diagnosis not present

## 2019-05-14 DIAGNOSIS — R1310 Dysphagia, unspecified: Secondary | ICD-10-CM | POA: Diagnosis not present

## 2019-05-14 DIAGNOSIS — C159 Malignant neoplasm of esophagus, unspecified: Secondary | ICD-10-CM | POA: Diagnosis not present

## 2019-05-14 DIAGNOSIS — C154 Malignant neoplasm of middle third of esophagus: Secondary | ICD-10-CM | POA: Diagnosis not present

## 2019-05-14 DIAGNOSIS — R109 Unspecified abdominal pain: Secondary | ICD-10-CM | POA: Diagnosis not present

## 2019-05-14 DIAGNOSIS — Z5111 Encounter for antineoplastic chemotherapy: Secondary | ICD-10-CM | POA: Diagnosis not present

## 2019-05-14 DIAGNOSIS — E860 Dehydration: Secondary | ICD-10-CM | POA: Diagnosis not present

## 2019-05-14 DIAGNOSIS — Z87891 Personal history of nicotine dependence: Secondary | ICD-10-CM | POA: Diagnosis not present

## 2019-05-15 DIAGNOSIS — Z510 Encounter for antineoplastic radiation therapy: Secondary | ICD-10-CM | POA: Diagnosis not present

## 2019-05-15 DIAGNOSIS — R1310 Dysphagia, unspecified: Secondary | ICD-10-CM | POA: Diagnosis not present

## 2019-05-15 DIAGNOSIS — C159 Malignant neoplasm of esophagus, unspecified: Secondary | ICD-10-CM | POA: Diagnosis not present

## 2019-05-18 DIAGNOSIS — C159 Malignant neoplasm of esophagus, unspecified: Secondary | ICD-10-CM | POA: Diagnosis not present

## 2019-05-19 DIAGNOSIS — C159 Malignant neoplasm of esophagus, unspecified: Secondary | ICD-10-CM | POA: Diagnosis not present

## 2019-05-20 DIAGNOSIS — C159 Malignant neoplasm of esophagus, unspecified: Secondary | ICD-10-CM | POA: Diagnosis not present

## 2019-05-20 DIAGNOSIS — C154 Malignant neoplasm of middle third of esophagus: Secondary | ICD-10-CM | POA: Diagnosis not present

## 2019-05-21 DIAGNOSIS — C154 Malignant neoplasm of middle third of esophagus: Secondary | ICD-10-CM | POA: Diagnosis not present

## 2019-05-21 DIAGNOSIS — E860 Dehydration: Secondary | ICD-10-CM | POA: Diagnosis not present

## 2019-05-21 DIAGNOSIS — Z87891 Personal history of nicotine dependence: Secondary | ICD-10-CM | POA: Diagnosis not present

## 2019-05-21 DIAGNOSIS — Z5111 Encounter for antineoplastic chemotherapy: Secondary | ICD-10-CM | POA: Diagnosis not present

## 2019-05-21 DIAGNOSIS — R1310 Dysphagia, unspecified: Secondary | ICD-10-CM | POA: Diagnosis not present

## 2019-05-21 DIAGNOSIS — C159 Malignant neoplasm of esophagus, unspecified: Secondary | ICD-10-CM | POA: Diagnosis not present

## 2019-05-21 DIAGNOSIS — F329 Major depressive disorder, single episode, unspecified: Secondary | ICD-10-CM | POA: Diagnosis not present

## 2019-05-22 DIAGNOSIS — C159 Malignant neoplasm of esophagus, unspecified: Secondary | ICD-10-CM | POA: Diagnosis not present

## 2019-05-25 DIAGNOSIS — C159 Malignant neoplasm of esophagus, unspecified: Secondary | ICD-10-CM | POA: Diagnosis not present

## 2019-05-26 DIAGNOSIS — C159 Malignant neoplasm of esophagus, unspecified: Secondary | ICD-10-CM | POA: Diagnosis not present

## 2019-05-26 DIAGNOSIS — C154 Malignant neoplasm of middle third of esophagus: Secondary | ICD-10-CM | POA: Diagnosis not present

## 2019-05-27 DIAGNOSIS — C159 Malignant neoplasm of esophagus, unspecified: Secondary | ICD-10-CM | POA: Diagnosis not present

## 2019-05-27 DIAGNOSIS — C154 Malignant neoplasm of middle third of esophagus: Secondary | ICD-10-CM | POA: Diagnosis not present

## 2019-05-28 DIAGNOSIS — C770 Secondary and unspecified malignant neoplasm of lymph nodes of head, face and neck: Secondary | ICD-10-CM | POA: Diagnosis not present

## 2019-05-28 DIAGNOSIS — G893 Neoplasm related pain (acute) (chronic): Secondary | ICD-10-CM | POA: Diagnosis not present

## 2019-05-28 DIAGNOSIS — Z79899 Other long term (current) drug therapy: Secondary | ICD-10-CM | POA: Diagnosis not present

## 2019-05-28 DIAGNOSIS — Z5111 Encounter for antineoplastic chemotherapy: Secondary | ICD-10-CM | POA: Diagnosis not present

## 2019-05-28 DIAGNOSIS — C159 Malignant neoplasm of esophagus, unspecified: Secondary | ICD-10-CM | POA: Diagnosis not present

## 2019-05-28 DIAGNOSIS — F4321 Adjustment disorder with depressed mood: Secondary | ICD-10-CM | POA: Diagnosis not present

## 2019-05-28 DIAGNOSIS — R5383 Other fatigue: Secondary | ICD-10-CM | POA: Diagnosis not present

## 2019-05-28 DIAGNOSIS — C154 Malignant neoplasm of middle third of esophagus: Secondary | ICD-10-CM | POA: Diagnosis not present

## 2019-05-28 DIAGNOSIS — E860 Dehydration: Secondary | ICD-10-CM | POA: Diagnosis not present

## 2019-05-28 DIAGNOSIS — Z87891 Personal history of nicotine dependence: Secondary | ICD-10-CM | POA: Diagnosis not present

## 2019-05-28 DIAGNOSIS — R197 Diarrhea, unspecified: Secondary | ICD-10-CM | POA: Diagnosis not present

## 2019-05-28 DIAGNOSIS — Z923 Personal history of irradiation: Secondary | ICD-10-CM | POA: Diagnosis not present

## 2019-05-29 DIAGNOSIS — C159 Malignant neoplasm of esophagus, unspecified: Secondary | ICD-10-CM | POA: Diagnosis not present

## 2019-06-01 DIAGNOSIS — C154 Malignant neoplasm of middle third of esophagus: Secondary | ICD-10-CM | POA: Diagnosis not present

## 2019-06-01 DIAGNOSIS — C159 Malignant neoplasm of esophagus, unspecified: Secondary | ICD-10-CM | POA: Diagnosis not present

## 2019-06-05 DIAGNOSIS — C154 Malignant neoplasm of middle third of esophagus: Secondary | ICD-10-CM | POA: Diagnosis not present

## 2019-06-12 DIAGNOSIS — C154 Malignant neoplasm of middle third of esophagus: Secondary | ICD-10-CM | POA: Diagnosis not present

## 2019-06-12 DIAGNOSIS — Z87891 Personal history of nicotine dependence: Secondary | ICD-10-CM | POA: Diagnosis not present

## 2019-06-12 DIAGNOSIS — R739 Hyperglycemia, unspecified: Secondary | ICD-10-CM | POA: Diagnosis not present

## 2019-06-12 DIAGNOSIS — R5381 Other malaise: Secondary | ICD-10-CM | POA: Diagnosis not present

## 2019-06-17 ENCOUNTER — Other Ambulatory Visit: Payer: Self-pay | Admitting: Psychiatry

## 2019-06-17 DIAGNOSIS — F411 Generalized anxiety disorder: Secondary | ICD-10-CM

## 2019-06-17 DIAGNOSIS — F331 Major depressive disorder, recurrent, moderate: Secondary | ICD-10-CM

## 2019-06-18 DIAGNOSIS — J439 Emphysema, unspecified: Secondary | ICD-10-CM | POA: Diagnosis not present

## 2019-06-18 DIAGNOSIS — Z87891 Personal history of nicotine dependence: Secondary | ICD-10-CM | POA: Diagnosis not present

## 2019-06-18 DIAGNOSIS — R200 Anesthesia of skin: Secondary | ICD-10-CM | POA: Diagnosis not present

## 2019-06-18 DIAGNOSIS — H5713 Ocular pain, bilateral: Secondary | ICD-10-CM | POA: Diagnosis not present

## 2019-06-18 DIAGNOSIS — I2510 Atherosclerotic heart disease of native coronary artery without angina pectoris: Secondary | ICD-10-CM | POA: Diagnosis not present

## 2019-06-18 DIAGNOSIS — R634 Abnormal weight loss: Secondary | ICD-10-CM | POA: Diagnosis not present

## 2019-06-18 DIAGNOSIS — H30132 Disseminated chorioretinal inflammation, generalized, left eye: Secondary | ICD-10-CM | POA: Diagnosis not present

## 2019-06-18 DIAGNOSIS — D849 Immunodeficiency, unspecified: Secondary | ICD-10-CM | POA: Diagnosis not present

## 2019-06-18 DIAGNOSIS — C154 Malignant neoplasm of middle third of esophagus: Secondary | ICD-10-CM | POA: Diagnosis not present

## 2019-06-18 DIAGNOSIS — R55 Syncope and collapse: Secondary | ICD-10-CM | POA: Diagnosis not present

## 2019-06-18 DIAGNOSIS — H3589 Other specified retinal disorders: Secondary | ICD-10-CM | POA: Diagnosis not present

## 2019-06-18 DIAGNOSIS — F329 Major depressive disorder, single episode, unspecified: Secondary | ICD-10-CM | POA: Diagnosis not present

## 2019-06-18 DIAGNOSIS — B029 Zoster without complications: Secondary | ICD-10-CM | POA: Diagnosis not present

## 2019-06-18 DIAGNOSIS — N400 Enlarged prostate without lower urinary tract symptoms: Secondary | ICD-10-CM | POA: Diagnosis not present

## 2019-06-18 DIAGNOSIS — R7303 Prediabetes: Secondary | ICD-10-CM | POA: Diagnosis not present

## 2019-06-18 DIAGNOSIS — E871 Hypo-osmolality and hyponatremia: Secondary | ICD-10-CM | POA: Diagnosis not present

## 2019-06-18 DIAGNOSIS — R1310 Dysphagia, unspecified: Secondary | ICD-10-CM | POA: Diagnosis not present

## 2019-06-18 DIAGNOSIS — H30131 Disseminated chorioretinal inflammation, generalized, right eye: Secondary | ICD-10-CM | POA: Diagnosis not present

## 2019-06-18 DIAGNOSIS — H5462 Unqualified visual loss, left eye, normal vision right eye: Secondary | ICD-10-CM | POA: Diagnosis not present

## 2019-06-18 DIAGNOSIS — B019 Varicella without complication: Secondary | ICD-10-CM | POA: Diagnosis not present

## 2019-06-18 DIAGNOSIS — Z882 Allergy status to sulfonamides status: Secondary | ICD-10-CM | POA: Diagnosis not present

## 2019-06-18 DIAGNOSIS — I10 Essential (primary) hypertension: Secondary | ICD-10-CM | POA: Diagnosis not present

## 2019-06-18 DIAGNOSIS — H3093 Unspecified chorioretinal inflammation, bilateral: Secondary | ICD-10-CM | POA: Diagnosis not present

## 2019-06-18 DIAGNOSIS — R0600 Dyspnea, unspecified: Secondary | ICD-10-CM | POA: Diagnosis not present

## 2019-06-18 DIAGNOSIS — B0189 Other varicella complications: Secondary | ICD-10-CM | POA: Diagnosis not present

## 2019-06-18 DIAGNOSIS — H547 Unspecified visual loss: Secondary | ICD-10-CM | POA: Diagnosis not present

## 2019-06-18 DIAGNOSIS — D630 Anemia in neoplastic disease: Secondary | ICD-10-CM | POA: Diagnosis not present

## 2019-06-18 DIAGNOSIS — C159 Malignant neoplasm of esophagus, unspecified: Secondary | ICD-10-CM | POA: Diagnosis not present

## 2019-06-18 DIAGNOSIS — I4892 Unspecified atrial flutter: Secondary | ICD-10-CM | POA: Diagnosis not present

## 2019-06-18 DIAGNOSIS — E611 Iron deficiency: Secondary | ICD-10-CM | POA: Diagnosis not present

## 2019-06-18 DIAGNOSIS — I77810 Thoracic aortic ectasia: Secondary | ICD-10-CM | POA: Diagnosis not present

## 2019-06-18 DIAGNOSIS — I776 Arteritis, unspecified: Secondary | ICD-10-CM | POA: Diagnosis not present

## 2019-06-18 DIAGNOSIS — E785 Hyperlipidemia, unspecified: Secondary | ICD-10-CM | POA: Diagnosis not present

## 2019-06-18 DIAGNOSIS — H539 Unspecified visual disturbance: Secondary | ICD-10-CM | POA: Diagnosis not present

## 2019-06-18 DIAGNOSIS — T451X5A Adverse effect of antineoplastic and immunosuppressive drugs, initial encounter: Secondary | ICD-10-CM | POA: Diagnosis not present

## 2019-06-18 DIAGNOSIS — D84821 Immunodeficiency due to drugs: Secondary | ICD-10-CM | POA: Diagnosis not present

## 2019-06-18 DIAGNOSIS — Z20822 Contact with and (suspected) exposure to covid-19: Secondary | ICD-10-CM | POA: Diagnosis not present

## 2019-06-18 DIAGNOSIS — R202 Paresthesia of skin: Secondary | ICD-10-CM | POA: Diagnosis not present

## 2019-06-18 DIAGNOSIS — H30133 Disseminated chorioretinal inflammation, generalized, bilateral: Secondary | ICD-10-CM | POA: Diagnosis not present

## 2019-06-18 DIAGNOSIS — H3092 Unspecified chorioretinal inflammation, left eye: Secondary | ICD-10-CM | POA: Diagnosis not present

## 2019-06-18 DIAGNOSIS — I480 Paroxysmal atrial fibrillation: Secondary | ICD-10-CM | POA: Diagnosis not present

## 2019-06-19 DIAGNOSIS — H30133 Disseminated chorioretinal inflammation, generalized, bilateral: Secondary | ICD-10-CM | POA: Diagnosis not present

## 2019-06-22 DIAGNOSIS — H30133 Disseminated chorioretinal inflammation, generalized, bilateral: Secondary | ICD-10-CM | POA: Diagnosis not present

## 2019-06-25 DIAGNOSIS — H44113 Panuveitis, bilateral: Secondary | ICD-10-CM | POA: Diagnosis not present

## 2019-06-25 DIAGNOSIS — H35353 Cystoid macular degeneration, bilateral: Secondary | ICD-10-CM | POA: Diagnosis not present

## 2019-06-25 DIAGNOSIS — H30133 Disseminated chorioretinal inflammation, generalized, bilateral: Secondary | ICD-10-CM | POA: Diagnosis not present

## 2019-06-25 DIAGNOSIS — H3093 Unspecified chorioretinal inflammation, bilateral: Secondary | ICD-10-CM | POA: Diagnosis not present

## 2019-06-26 DIAGNOSIS — D849 Immunodeficiency, unspecified: Secondary | ICD-10-CM | POA: Diagnosis not present

## 2019-06-26 DIAGNOSIS — H5462 Unqualified visual loss, left eye, normal vision right eye: Secondary | ICD-10-CM | POA: Diagnosis not present

## 2019-06-26 DIAGNOSIS — B0239 Other herpes zoster eye disease: Secondary | ICD-10-CM | POA: Diagnosis not present

## 2019-06-26 DIAGNOSIS — F329 Major depressive disorder, single episode, unspecified: Secondary | ICD-10-CM | POA: Diagnosis not present

## 2019-06-26 DIAGNOSIS — R1310 Dysphagia, unspecified: Secondary | ICD-10-CM | POA: Diagnosis not present

## 2019-06-26 DIAGNOSIS — H30132 Disseminated chorioretinal inflammation, generalized, left eye: Secondary | ICD-10-CM | POA: Diagnosis not present

## 2019-06-26 DIAGNOSIS — Z79899 Other long term (current) drug therapy: Secondary | ICD-10-CM | POA: Diagnosis not present

## 2019-06-26 DIAGNOSIS — I96 Gangrene, not elsewhere classified: Secondary | ICD-10-CM | POA: Diagnosis not present

## 2019-06-26 DIAGNOSIS — I10 Essential (primary) hypertension: Secondary | ICD-10-CM | POA: Diagnosis not present

## 2019-06-26 DIAGNOSIS — B029 Zoster without complications: Secondary | ICD-10-CM | POA: Diagnosis not present

## 2019-06-26 DIAGNOSIS — Z452 Encounter for adjustment and management of vascular access device: Secondary | ICD-10-CM | POA: Diagnosis not present

## 2019-06-26 DIAGNOSIS — C154 Malignant neoplasm of middle third of esophagus: Secondary | ICD-10-CM | POA: Diagnosis not present

## 2019-06-26 DIAGNOSIS — J439 Emphysema, unspecified: Secondary | ICD-10-CM | POA: Diagnosis not present

## 2019-06-29 DIAGNOSIS — B019 Varicella without complication: Secondary | ICD-10-CM | POA: Diagnosis not present

## 2019-06-29 DIAGNOSIS — B029 Zoster without complications: Secondary | ICD-10-CM | POA: Diagnosis not present

## 2019-06-29 DIAGNOSIS — H30133 Disseminated chorioretinal inflammation, generalized, bilateral: Secondary | ICD-10-CM | POA: Diagnosis not present

## 2019-07-02 DIAGNOSIS — H44113 Panuveitis, bilateral: Secondary | ICD-10-CM | POA: Diagnosis not present

## 2019-07-02 DIAGNOSIS — H30133 Disseminated chorioretinal inflammation, generalized, bilateral: Secondary | ICD-10-CM | POA: Diagnosis not present

## 2019-07-02 DIAGNOSIS — H3093 Unspecified chorioretinal inflammation, bilateral: Secondary | ICD-10-CM | POA: Diagnosis not present

## 2019-07-02 DIAGNOSIS — H35353 Cystoid macular degeneration, bilateral: Secondary | ICD-10-CM | POA: Diagnosis not present

## 2019-07-06 DIAGNOSIS — I480 Paroxysmal atrial fibrillation: Secondary | ICD-10-CM | POA: Diagnosis not present

## 2019-07-06 DIAGNOSIS — I4892 Unspecified atrial flutter: Secondary | ICD-10-CM | POA: Diagnosis not present

## 2019-07-06 DIAGNOSIS — I2510 Atherosclerotic heart disease of native coronary artery without angina pectoris: Secondary | ICD-10-CM | POA: Diagnosis not present

## 2019-07-06 DIAGNOSIS — H30133 Disseminated chorioretinal inflammation, generalized, bilateral: Secondary | ICD-10-CM | POA: Diagnosis not present

## 2019-07-06 DIAGNOSIS — I10 Essential (primary) hypertension: Secondary | ICD-10-CM | POA: Diagnosis not present

## 2019-07-06 DIAGNOSIS — C154 Malignant neoplasm of middle third of esophagus: Secondary | ICD-10-CM | POA: Diagnosis not present

## 2019-07-06 DIAGNOSIS — Z923 Personal history of irradiation: Secondary | ICD-10-CM | POA: Diagnosis not present

## 2019-07-06 DIAGNOSIS — Z9221 Personal history of antineoplastic chemotherapy: Secondary | ICD-10-CM | POA: Diagnosis not present

## 2019-07-07 DIAGNOSIS — B0239 Other herpes zoster eye disease: Secondary | ICD-10-CM | POA: Diagnosis not present

## 2019-07-07 DIAGNOSIS — H30132 Disseminated chorioretinal inflammation, generalized, left eye: Secondary | ICD-10-CM | POA: Diagnosis not present

## 2019-07-09 DIAGNOSIS — H35353 Cystoid macular degeneration, bilateral: Secondary | ICD-10-CM | POA: Diagnosis not present

## 2019-07-09 DIAGNOSIS — H3093 Unspecified chorioretinal inflammation, bilateral: Secondary | ICD-10-CM | POA: Diagnosis not present

## 2019-07-09 DIAGNOSIS — H44113 Panuveitis, bilateral: Secondary | ICD-10-CM | POA: Diagnosis not present

## 2019-07-09 DIAGNOSIS — H30133 Disseminated chorioretinal inflammation, generalized, bilateral: Secondary | ICD-10-CM | POA: Diagnosis not present

## 2019-07-16 DIAGNOSIS — H30133 Disseminated chorioretinal inflammation, generalized, bilateral: Secondary | ICD-10-CM | POA: Diagnosis not present

## 2019-07-16 DIAGNOSIS — H44113 Panuveitis, bilateral: Secondary | ICD-10-CM | POA: Diagnosis not present

## 2019-07-16 DIAGNOSIS — H3093 Unspecified chorioretinal inflammation, bilateral: Secondary | ICD-10-CM | POA: Diagnosis not present

## 2019-07-16 DIAGNOSIS — H35353 Cystoid macular degeneration, bilateral: Secondary | ICD-10-CM | POA: Diagnosis not present

## 2019-07-17 DIAGNOSIS — F439 Reaction to severe stress, unspecified: Secondary | ICD-10-CM | POA: Diagnosis not present

## 2019-07-17 DIAGNOSIS — F419 Anxiety disorder, unspecified: Secondary | ICD-10-CM | POA: Diagnosis not present

## 2019-07-17 DIAGNOSIS — F329 Major depressive disorder, single episode, unspecified: Secondary | ICD-10-CM | POA: Diagnosis not present

## 2019-07-17 DIAGNOSIS — Z79899 Other long term (current) drug therapy: Secondary | ICD-10-CM | POA: Diagnosis not present

## 2019-07-23 ENCOUNTER — Other Ambulatory Visit: Payer: Self-pay | Admitting: Psychiatry

## 2019-07-23 DIAGNOSIS — F331 Major depressive disorder, recurrent, moderate: Secondary | ICD-10-CM

## 2019-07-23 DIAGNOSIS — F411 Generalized anxiety disorder: Secondary | ICD-10-CM

## 2019-07-23 DIAGNOSIS — H30133 Disseminated chorioretinal inflammation, generalized, bilateral: Secondary | ICD-10-CM | POA: Diagnosis not present

## 2019-07-23 DIAGNOSIS — H35353 Cystoid macular degeneration, bilateral: Secondary | ICD-10-CM | POA: Diagnosis not present

## 2019-07-26 DIAGNOSIS — H30132 Disseminated chorioretinal inflammation, generalized, left eye: Secondary | ICD-10-CM | POA: Diagnosis not present

## 2019-07-26 DIAGNOSIS — R1310 Dysphagia, unspecified: Secondary | ICD-10-CM | POA: Diagnosis not present

## 2019-07-26 DIAGNOSIS — B0239 Other herpes zoster eye disease: Secondary | ICD-10-CM | POA: Diagnosis not present

## 2019-07-26 DIAGNOSIS — D849 Immunodeficiency, unspecified: Secondary | ICD-10-CM | POA: Diagnosis not present

## 2019-07-26 DIAGNOSIS — F329 Major depressive disorder, single episode, unspecified: Secondary | ICD-10-CM | POA: Diagnosis not present

## 2019-07-26 DIAGNOSIS — C154 Malignant neoplasm of middle third of esophagus: Secondary | ICD-10-CM | POA: Diagnosis not present

## 2019-07-26 DIAGNOSIS — H5462 Unqualified visual loss, left eye, normal vision right eye: Secondary | ICD-10-CM | POA: Diagnosis not present

## 2019-07-26 DIAGNOSIS — I10 Essential (primary) hypertension: Secondary | ICD-10-CM | POA: Diagnosis not present

## 2019-07-26 DIAGNOSIS — Z452 Encounter for adjustment and management of vascular access device: Secondary | ICD-10-CM | POA: Diagnosis not present

## 2019-07-26 DIAGNOSIS — Z79899 Other long term (current) drug therapy: Secondary | ICD-10-CM | POA: Diagnosis not present

## 2019-07-26 DIAGNOSIS — I96 Gangrene, not elsewhere classified: Secondary | ICD-10-CM | POA: Diagnosis not present

## 2019-07-26 DIAGNOSIS — J439 Emphysema, unspecified: Secondary | ICD-10-CM | POA: Diagnosis not present

## 2019-07-30 DIAGNOSIS — H44113 Panuveitis, bilateral: Secondary | ICD-10-CM | POA: Diagnosis not present

## 2019-07-30 DIAGNOSIS — H35353 Cystoid macular degeneration, bilateral: Secondary | ICD-10-CM | POA: Diagnosis not present

## 2019-07-30 DIAGNOSIS — H3093 Unspecified chorioretinal inflammation, bilateral: Secondary | ICD-10-CM | POA: Diagnosis not present

## 2019-07-30 DIAGNOSIS — H30133 Disseminated chorioretinal inflammation, generalized, bilateral: Secondary | ICD-10-CM | POA: Diagnosis not present

## 2019-08-13 DIAGNOSIS — Z9221 Personal history of antineoplastic chemotherapy: Secondary | ICD-10-CM | POA: Diagnosis not present

## 2019-08-13 DIAGNOSIS — Z923 Personal history of irradiation: Secondary | ICD-10-CM | POA: Diagnosis not present

## 2019-08-13 DIAGNOSIS — Z8501 Personal history of malignant neoplasm of esophagus: Secondary | ICD-10-CM | POA: Diagnosis not present

## 2019-08-13 DIAGNOSIS — R918 Other nonspecific abnormal finding of lung field: Secondary | ICD-10-CM | POA: Diagnosis not present

## 2019-08-13 DIAGNOSIS — Z08 Encounter for follow-up examination after completed treatment for malignant neoplasm: Secondary | ICD-10-CM | POA: Diagnosis not present

## 2019-08-13 DIAGNOSIS — Z87891 Personal history of nicotine dependence: Secondary | ICD-10-CM | POA: Diagnosis not present

## 2019-08-13 DIAGNOSIS — R1319 Other dysphagia: Secondary | ICD-10-CM | POA: Diagnosis not present

## 2019-08-13 DIAGNOSIS — C154 Malignant neoplasm of middle third of esophagus: Secondary | ICD-10-CM | POA: Diagnosis not present

## 2019-08-13 DIAGNOSIS — H538 Other visual disturbances: Secondary | ICD-10-CM | POA: Diagnosis not present

## 2019-08-13 DIAGNOSIS — H30132 Disseminated chorioretinal inflammation, generalized, left eye: Secondary | ICD-10-CM | POA: Diagnosis not present

## 2019-08-13 DIAGNOSIS — I6523 Occlusion and stenosis of bilateral carotid arteries: Secondary | ICD-10-CM | POA: Diagnosis not present

## 2019-08-14 DIAGNOSIS — C153 Malignant neoplasm of upper third of esophagus: Secondary | ICD-10-CM | POA: Diagnosis not present

## 2019-08-14 DIAGNOSIS — C159 Malignant neoplasm of esophagus, unspecified: Secondary | ICD-10-CM | POA: Diagnosis not present

## 2019-08-14 DIAGNOSIS — C155 Malignant neoplasm of lower third of esophagus: Secondary | ICD-10-CM | POA: Diagnosis not present

## 2019-08-20 DIAGNOSIS — H30133 Disseminated chorioretinal inflammation, generalized, bilateral: Secondary | ICD-10-CM | POA: Diagnosis not present

## 2019-08-20 DIAGNOSIS — H3093 Unspecified chorioretinal inflammation, bilateral: Secondary | ICD-10-CM | POA: Diagnosis not present

## 2019-08-20 DIAGNOSIS — H33002 Unspecified retinal detachment with retinal break, left eye: Secondary | ICD-10-CM | POA: Diagnosis not present

## 2019-08-20 DIAGNOSIS — H35353 Cystoid macular degeneration, bilateral: Secondary | ICD-10-CM | POA: Diagnosis not present

## 2019-08-20 DIAGNOSIS — H44113 Panuveitis, bilateral: Secondary | ICD-10-CM | POA: Diagnosis not present

## 2019-08-26 DIAGNOSIS — H44113 Panuveitis, bilateral: Secondary | ICD-10-CM | POA: Diagnosis not present

## 2019-08-26 DIAGNOSIS — H35371 Puckering of macula, right eye: Secondary | ICD-10-CM | POA: Diagnosis not present

## 2019-08-26 DIAGNOSIS — Z85828 Personal history of other malignant neoplasm of skin: Secondary | ICD-10-CM | POA: Diagnosis not present

## 2019-08-26 DIAGNOSIS — Z9221 Personal history of antineoplastic chemotherapy: Secondary | ICD-10-CM | POA: Diagnosis not present

## 2019-08-26 DIAGNOSIS — J3800 Paralysis of vocal cords and larynx, unspecified: Secondary | ICD-10-CM | POA: Diagnosis not present

## 2019-08-26 DIAGNOSIS — I2510 Atherosclerotic heart disease of native coronary artery without angina pectoris: Secondary | ICD-10-CM | POA: Diagnosis not present

## 2019-08-26 DIAGNOSIS — R7303 Prediabetes: Secondary | ICD-10-CM | POA: Diagnosis not present

## 2019-08-26 DIAGNOSIS — I480 Paroxysmal atrial fibrillation: Secondary | ICD-10-CM | POA: Diagnosis not present

## 2019-08-26 DIAGNOSIS — Z9889 Other specified postprocedural states: Secondary | ICD-10-CM | POA: Diagnosis not present

## 2019-08-26 DIAGNOSIS — Z87891 Personal history of nicotine dependence: Secondary | ICD-10-CM | POA: Diagnosis not present

## 2019-08-26 DIAGNOSIS — H3589 Other specified retinal disorders: Secondary | ICD-10-CM | POA: Diagnosis not present

## 2019-08-26 DIAGNOSIS — D649 Anemia, unspecified: Secondary | ICD-10-CM | POA: Diagnosis not present

## 2019-08-26 DIAGNOSIS — H33021 Retinal detachment with multiple breaks, right eye: Secondary | ICD-10-CM | POA: Diagnosis not present

## 2019-08-26 DIAGNOSIS — H33022 Retinal detachment with multiple breaks, left eye: Secondary | ICD-10-CM | POA: Diagnosis not present

## 2019-08-26 DIAGNOSIS — Z7982 Long term (current) use of aspirin: Secondary | ICD-10-CM | POA: Diagnosis not present

## 2019-08-26 DIAGNOSIS — R490 Dysphonia: Secondary | ICD-10-CM | POA: Diagnosis not present

## 2019-08-26 DIAGNOSIS — Z923 Personal history of irradiation: Secondary | ICD-10-CM | POA: Diagnosis not present

## 2019-08-26 DIAGNOSIS — Z882 Allergy status to sulfonamides status: Secondary | ICD-10-CM | POA: Diagnosis not present

## 2019-08-26 DIAGNOSIS — Z8501 Personal history of malignant neoplasm of esophagus: Secondary | ICD-10-CM | POA: Diagnosis not present

## 2019-08-26 DIAGNOSIS — Z79899 Other long term (current) drug therapy: Secondary | ICD-10-CM | POA: Diagnosis not present

## 2019-08-26 DIAGNOSIS — H3582 Retinal ischemia: Secondary | ICD-10-CM | POA: Diagnosis not present

## 2019-08-26 DIAGNOSIS — N400 Enlarged prostate without lower urinary tract symptoms: Secondary | ICD-10-CM | POA: Diagnosis not present

## 2019-08-26 DIAGNOSIS — I10 Essential (primary) hypertension: Secondary | ICD-10-CM | POA: Diagnosis not present

## 2019-08-27 DIAGNOSIS — H33023 Retinal detachment with multiple breaks, bilateral: Secondary | ICD-10-CM | POA: Diagnosis not present

## 2019-09-03 DIAGNOSIS — H44113 Panuveitis, bilateral: Secondary | ICD-10-CM | POA: Diagnosis not present

## 2019-09-03 DIAGNOSIS — H30133 Disseminated chorioretinal inflammation, generalized, bilateral: Secondary | ICD-10-CM | POA: Diagnosis not present

## 2019-09-03 DIAGNOSIS — H33023 Retinal detachment with multiple breaks, bilateral: Secondary | ICD-10-CM | POA: Diagnosis not present

## 2019-09-10 DIAGNOSIS — F329 Major depressive disorder, single episode, unspecified: Secondary | ICD-10-CM | POA: Diagnosis not present

## 2019-09-13 DIAGNOSIS — Z23 Encounter for immunization: Secondary | ICD-10-CM | POA: Diagnosis not present

## 2019-09-15 DIAGNOSIS — R05 Cough: Secondary | ICD-10-CM | POA: Diagnosis not present

## 2019-09-15 DIAGNOSIS — R062 Wheezing: Secondary | ICD-10-CM | POA: Diagnosis not present

## 2019-09-24 DIAGNOSIS — H33023 Retinal detachment with multiple breaks, bilateral: Secondary | ICD-10-CM | POA: Diagnosis not present

## 2019-10-05 DIAGNOSIS — H33023 Retinal detachment with multiple breaks, bilateral: Secondary | ICD-10-CM | POA: Diagnosis not present

## 2019-10-23 DIAGNOSIS — I4891 Unspecified atrial fibrillation: Secondary | ICD-10-CM | POA: Diagnosis not present

## 2019-10-23 DIAGNOSIS — F329 Major depressive disorder, single episode, unspecified: Secondary | ICD-10-CM | POA: Diagnosis not present

## 2019-10-23 DIAGNOSIS — N401 Enlarged prostate with lower urinary tract symptoms: Secondary | ICD-10-CM | POA: Diagnosis not present

## 2019-10-23 DIAGNOSIS — E782 Mixed hyperlipidemia: Secondary | ICD-10-CM | POA: Diagnosis not present

## 2019-10-23 DIAGNOSIS — I2510 Atherosclerotic heart disease of native coronary artery without angina pectoris: Secondary | ICD-10-CM | POA: Diagnosis not present

## 2019-10-23 DIAGNOSIS — M1990 Unspecified osteoarthritis, unspecified site: Secondary | ICD-10-CM | POA: Diagnosis not present

## 2019-10-23 DIAGNOSIS — I10 Essential (primary) hypertension: Secondary | ICD-10-CM | POA: Diagnosis not present

## 2019-10-23 DIAGNOSIS — N400 Enlarged prostate without lower urinary tract symptoms: Secondary | ICD-10-CM | POA: Diagnosis not present

## 2019-10-23 DIAGNOSIS — D500 Iron deficiency anemia secondary to blood loss (chronic): Secondary | ICD-10-CM | POA: Diagnosis not present

## 2019-10-28 DIAGNOSIS — F439 Reaction to severe stress, unspecified: Secondary | ICD-10-CM | POA: Diagnosis not present

## 2019-10-28 DIAGNOSIS — F32A Depression, unspecified: Secondary | ICD-10-CM | POA: Diagnosis not present

## 2019-10-29 DIAGNOSIS — H44113 Panuveitis, bilateral: Secondary | ICD-10-CM | POA: Diagnosis not present

## 2019-10-29 DIAGNOSIS — H35353 Cystoid macular degeneration, bilateral: Secondary | ICD-10-CM | POA: Diagnosis not present

## 2019-11-12 DIAGNOSIS — Z923 Personal history of irradiation: Secondary | ICD-10-CM | POA: Diagnosis not present

## 2019-11-12 DIAGNOSIS — C159 Malignant neoplasm of esophagus, unspecified: Secondary | ICD-10-CM | POA: Diagnosis not present

## 2019-11-12 DIAGNOSIS — B028 Zoster with other complications: Secondary | ICD-10-CM | POA: Diagnosis not present

## 2019-11-12 DIAGNOSIS — R918 Other nonspecific abnormal finding of lung field: Secondary | ICD-10-CM | POA: Diagnosis not present

## 2019-11-12 DIAGNOSIS — Z23 Encounter for immunization: Secondary | ICD-10-CM | POA: Diagnosis not present

## 2019-11-12 DIAGNOSIS — Z9221 Personal history of antineoplastic chemotherapy: Secondary | ICD-10-CM | POA: Diagnosis not present

## 2019-11-12 DIAGNOSIS — C154 Malignant neoplasm of middle third of esophagus: Secondary | ICD-10-CM | POA: Diagnosis not present

## 2019-11-12 DIAGNOSIS — C155 Malignant neoplasm of lower third of esophagus: Secondary | ICD-10-CM | POA: Diagnosis not present

## 2019-11-12 DIAGNOSIS — H30133 Disseminated chorioretinal inflammation, generalized, bilateral: Secondary | ICD-10-CM | POA: Diagnosis not present

## 2019-11-12 DIAGNOSIS — Z87891 Personal history of nicotine dependence: Secondary | ICD-10-CM | POA: Diagnosis not present

## 2019-11-12 DIAGNOSIS — C779 Secondary and unspecified malignant neoplasm of lymph node, unspecified: Secondary | ICD-10-CM | POA: Diagnosis not present

## 2019-12-08 DIAGNOSIS — I2510 Atherosclerotic heart disease of native coronary artery without angina pectoris: Secondary | ICD-10-CM | POA: Diagnosis not present

## 2019-12-08 DIAGNOSIS — I4891 Unspecified atrial fibrillation: Secondary | ICD-10-CM | POA: Diagnosis not present

## 2019-12-08 DIAGNOSIS — I10 Essential (primary) hypertension: Secondary | ICD-10-CM | POA: Diagnosis not present

## 2019-12-08 DIAGNOSIS — N400 Enlarged prostate without lower urinary tract symptoms: Secondary | ICD-10-CM | POA: Diagnosis not present

## 2019-12-08 DIAGNOSIS — N401 Enlarged prostate with lower urinary tract symptoms: Secondary | ICD-10-CM | POA: Diagnosis not present

## 2019-12-08 DIAGNOSIS — E782 Mixed hyperlipidemia: Secondary | ICD-10-CM | POA: Diagnosis not present

## 2019-12-08 DIAGNOSIS — F329 Major depressive disorder, single episode, unspecified: Secondary | ICD-10-CM | POA: Diagnosis not present

## 2019-12-08 DIAGNOSIS — D500 Iron deficiency anemia secondary to blood loss (chronic): Secondary | ICD-10-CM | POA: Diagnosis not present

## 2019-12-08 DIAGNOSIS — M1990 Unspecified osteoarthritis, unspecified site: Secondary | ICD-10-CM | POA: Diagnosis not present

## 2019-12-09 DIAGNOSIS — I779 Disorder of arteries and arterioles, unspecified: Secondary | ICD-10-CM | POA: Diagnosis not present

## 2019-12-09 DIAGNOSIS — F338 Other recurrent depressive disorders: Secondary | ICD-10-CM | POA: Diagnosis not present

## 2019-12-09 DIAGNOSIS — I10 Essential (primary) hypertension: Secondary | ICD-10-CM | POA: Diagnosis not present

## 2019-12-09 DIAGNOSIS — Z8501 Personal history of malignant neoplasm of esophagus: Secondary | ICD-10-CM | POA: Diagnosis not present

## 2019-12-09 DIAGNOSIS — I2510 Atherosclerotic heart disease of native coronary artery without angina pectoris: Secondary | ICD-10-CM | POA: Diagnosis not present

## 2019-12-10 ENCOUNTER — Other Ambulatory Visit (HOSPITAL_COMMUNITY): Payer: Self-pay | Admitting: Family Medicine

## 2019-12-10 DIAGNOSIS — H30133 Disseminated chorioretinal inflammation, generalized, bilateral: Secondary | ICD-10-CM | POA: Diagnosis not present

## 2019-12-10 DIAGNOSIS — I779 Disorder of arteries and arterioles, unspecified: Secondary | ICD-10-CM

## 2019-12-10 DIAGNOSIS — H33023 Retinal detachment with multiple breaks, bilateral: Secondary | ICD-10-CM | POA: Diagnosis not present

## 2019-12-10 DIAGNOSIS — H44113 Panuveitis, bilateral: Secondary | ICD-10-CM | POA: Diagnosis not present

## 2019-12-10 DIAGNOSIS — H35353 Cystoid macular degeneration, bilateral: Secondary | ICD-10-CM | POA: Diagnosis not present

## 2019-12-14 DIAGNOSIS — I10 Essential (primary) hypertension: Secondary | ICD-10-CM | POA: Diagnosis not present

## 2019-12-14 DIAGNOSIS — I480 Paroxysmal atrial fibrillation: Secondary | ICD-10-CM | POA: Diagnosis not present

## 2019-12-16 ENCOUNTER — Other Ambulatory Visit: Payer: Self-pay

## 2019-12-16 ENCOUNTER — Ambulatory Visit (HOSPITAL_COMMUNITY)
Admission: RE | Admit: 2019-12-16 | Discharge: 2019-12-16 | Disposition: A | Discharge status: Home / Self Care | Payer: Medicare Other | Source: Ambulatory Visit | Attending: Family Medicine | Admitting: Family Medicine

## 2019-12-16 DIAGNOSIS — I779 Disorder of arteries and arterioles, unspecified: Secondary | ICD-10-CM

## 2019-12-16 NOTE — Progress Notes (Signed)
Carotid duplex has been completed.   Preliminary results in CV Proc.   Abram Sander 12/16/2019 2:39 PM

## 2019-12-18 DIAGNOSIS — I4892 Unspecified atrial flutter: Secondary | ICD-10-CM | POA: Diagnosis not present

## 2019-12-18 DIAGNOSIS — I480 Paroxysmal atrial fibrillation: Secondary | ICD-10-CM | POA: Diagnosis not present

## 2020-01-05 DIAGNOSIS — F439 Reaction to severe stress, unspecified: Secondary | ICD-10-CM | POA: Diagnosis not present

## 2020-01-05 DIAGNOSIS — F331 Major depressive disorder, recurrent, moderate: Secondary | ICD-10-CM | POA: Diagnosis not present

## 2020-01-08 DIAGNOSIS — I4892 Unspecified atrial flutter: Secondary | ICD-10-CM | POA: Diagnosis not present

## 2020-01-08 DIAGNOSIS — I480 Paroxysmal atrial fibrillation: Secondary | ICD-10-CM | POA: Diagnosis not present

## 2020-01-28 DIAGNOSIS — Z0000 Encounter for general adult medical examination without abnormal findings: Secondary | ICD-10-CM | POA: Diagnosis not present

## 2020-01-28 DIAGNOSIS — R8290 Unspecified abnormal findings in urine: Secondary | ICD-10-CM | POA: Diagnosis not present

## 2020-01-28 DIAGNOSIS — I10 Essential (primary) hypertension: Secondary | ICD-10-CM | POA: Diagnosis not present

## 2020-01-28 DIAGNOSIS — I779 Disorder of arteries and arterioles, unspecified: Secondary | ICD-10-CM | POA: Diagnosis not present

## 2020-01-28 DIAGNOSIS — E782 Mixed hyperlipidemia: Secondary | ICD-10-CM | POA: Diagnosis not present

## 2020-01-28 DIAGNOSIS — Z86010 Personal history of colonic polyps: Secondary | ICD-10-CM | POA: Diagnosis not present

## 2020-01-28 DIAGNOSIS — F338 Other recurrent depressive disorders: Secondary | ICD-10-CM | POA: Diagnosis not present

## 2020-01-28 DIAGNOSIS — R972 Elevated prostate specific antigen [PSA]: Secondary | ICD-10-CM | POA: Diagnosis not present

## 2020-01-28 DIAGNOSIS — H547 Unspecified visual loss: Secondary | ICD-10-CM | POA: Diagnosis not present

## 2020-01-28 DIAGNOSIS — Z8501 Personal history of malignant neoplasm of esophagus: Secondary | ICD-10-CM | POA: Diagnosis not present

## 2020-01-28 DIAGNOSIS — I2510 Atherosclerotic heart disease of native coronary artery without angina pectoris: Secondary | ICD-10-CM | POA: Diagnosis not present

## 2020-01-28 DIAGNOSIS — Z8679 Personal history of other diseases of the circulatory system: Secondary | ICD-10-CM | POA: Diagnosis not present

## 2020-01-29 ENCOUNTER — Encounter: Payer: Self-pay | Admitting: Vascular Surgery

## 2020-01-29 ENCOUNTER — Other Ambulatory Visit: Payer: Self-pay

## 2020-01-29 ENCOUNTER — Ambulatory Visit (INDEPENDENT_AMBULATORY_CARE_PROVIDER_SITE_OTHER): Payer: Medicare Other | Admitting: Vascular Surgery

## 2020-01-29 VITALS — BP 122/67 | HR 73 | Temp 98.2°F | Resp 20 | Ht 68.0 in | Wt 182.0 lb

## 2020-01-29 DIAGNOSIS — I779 Disorder of arteries and arterioles, unspecified: Secondary | ICD-10-CM

## 2020-01-29 NOTE — Progress Notes (Signed)
Patient ID: Colin Villa, male   DOB: 1940-05-26, 80 y.o.   MRN: [id number]  Reason for Consult: New Patient (Initial Visit)   Referred by Hulan Fess, MD  Subjective:     HPI:  Colin Villa is a 80 y.o. male with a history of esophageal cancer was recently noted to have carotid artery disease on PET scan.  He underwent duplex.  Patient denies any previous vascular disease.  He does take aspirin and statin.  He denies any history of stroke, TIA or amaurosis.  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  He does not know of any personal or family history of aneurysmal disease.  No complaints related to today's visit.  Past Medical History:  Diagnosis Date   ADHD    Blind in both eyes    BPH (benign prostatic hyperplasia)    Carotid artery occlusion    Colonic polyp    Depression    Elevated fasting glucose    Elevated PSA    Essential hypertension, benign    Hypertension    Kidney stones    Mixed dyslipidemia    Osteoarthritis    Especially of the left kneee which end stage    Family History  Problem Relation Age of Onset   Heart failure Mother    Melanoma Mother    Mental illness Mother    Heart failure Father    Cancer Brother        bladder   Diabetes Brother    Past Surgical History:  Procedure Laterality Date   COLONOSCOPY     CORONARY ANGIOGRAPHY  1998   KNEE SURGERY  1959   medial collateral ligament   LEFT HEART CATH AND CORONARY ANGIOGRAPHY N/A 08/10/2016   Procedure: LEFT HEART CATH AND CORONARY ANGIOGRAPHY;  Surgeon: Troy Sine, MD;  Location: [facility];  Service: Cardiovascular;  Laterality: N/A;   LITHOTRIPSY  [place] Left 2010   THORACIC AORTOGRAM N/A 08/10/2016   Procedure: Thoracic Aortogram;  Surgeon: Troy Sine, MD;  Location: [facility];  Service: Cardiovascular;  Laterality: N/A;    Short Social History:  Social History    Tobacco Use   Smoking status: Former Smoker    Quit date: 01/27/1996    Years since quitting: 24.0   Smokeless tobacco: Never Used  Substance Use Topics   Alcohol use: Yes    Comment: 4 beers weekly    Allergies  Allergen Reactions   Sulfa Antibiotics Other (See Comments)    Childhood allergy    Current Outpatient Medications  Medication Sig Dispense Refill   ARIPiprazole (ABILIFY) 2 MG tablet TAKE 1 TABLET(2 MG) BY MOUTH DAILY 90 tablet 0   Ascorbic Acid (VITAMIN C) 1000 MG tablet Take 1,000 mg by mouth daily.     clonazePAM (KLONOPIN) 0.5 MG tablet Take 1 tablet (0.5 mg total) by mouth 2 (two) times daily as needed. for anxiety 60 tablet 5   Ferrous Sulfate 134 MG TABS Take by mouth.     PARoxetine (PAXIL) 30 MG tablet TAKE 2 TABLETS(60 MG) BY MOUTH DAILY 180 tablet 0   ramipril (ALTACE) 2.5 MG capsule Take 2.5 mg by mouth daily.     rosuvastatin (CRESTOR) 40 MG tablet Take 1 tablet (40 mg total) by mouth daily. Please make overdue appt with Dr. Marlou Porch before anymore refills. 1st attempt 30 tablet 0   tamsulosin (FLOMAX)  0.4 MG CAPS capsule Take 0.4 mg by mouth daily.   11   XARELTO 20 MG TABS tablet      No current facility-administered medications for this visit.    Review of Systems  Constitutional:  Constitutional negative. HENT: HENT negative.  Eyes: Eyes negative.  Respiratory: Respiratory negative.  Cardiovascular: Cardiovascular negative.  GI: Gastrointestinal negative.  Musculoskeletal: Musculoskeletal negative.  Skin: Skin negative.  Neurological: Neurological negative. Hematologic: Hematologic/lymphatic negative.  Psychiatric: Psychiatric negative.        Objective:  Objective   Vitals:   01/29/20 1023 01/29/20 1025  BP: 108/66 122/67  Pulse: 73   Resp: 20   Temp: 98.2 F (36.8 C)   SpO2: 98%   Weight: 182 lb (82.6 kg)   Height: 5\' 8"  (1.727 m)    Body mass index is 27.67 kg/m.  Physical Exam HENT:     Head: Normocephalic.      Nose:     Comments: Wearing a mask Eyes:     Pupils: Pupils are equal, round, and reactive to light.  Neck:     Vascular: No carotid bruit.  Cardiovascular:     Rate and Rhythm: Normal rate.     Pulses: Normal pulses.  Pulmonary:     Effort: Pulmonary effort is normal.     Breath sounds: Normal breath sounds.  Abdominal:     General: Abdomen is flat.     Palpations: Abdomen is soft. There is no mass.  Musculoskeletal:        General: No swelling. Normal range of motion.  Skin:    General: Skin is warm and dry.     Capillary Refill: Capillary refill takes less than 2 seconds.  Neurological:     General: No focal deficit present.     Mental Status: He is alert.  Psychiatric:        Mood and Affect: Mood normal.        Behavior: Behavior normal.        Thought Content: Thought content normal.        Judgment: Judgment normal.     Data: Carotid duplex summary:  Right Carotid: Evidence consistent with a total occlusion of the right  ICA.   Left Carotid: Velocities in the left ICA are consistent with a 60-79%  stenosis.   Vertebrals: Bilateral vertebral arteries demonstrate antegrade flow.        Assessment/Plan:     80 year old male sent for evaluation of left ICA moderate grade stenosis.  I discussed with the patient and his wife that as this is asymptomatic he does not warrant any intervention at this time.  We also discussed the use of ultrasound as a good screening tool but that it certainly may overestimate the stenosis given the contralateral occlusion of the right internal carotid artery.  We have discussed the signs and symptoms of stroke.  Patient will remain on aspirin and statin.  He will follow-up in 1 year unless he has issues prior.  If he has increases in velocities that would otherwise merit intervention he will need a CT angio first given the aforementioned contralateral occlusion.  All questions were answered.     Waynetta Sandy  MD [facility]

## 2020-02-02 DIAGNOSIS — F439 Reaction to severe stress, unspecified: Secondary | ICD-10-CM | POA: Diagnosis not present

## 2020-02-02 DIAGNOSIS — F331 Major depressive disorder, recurrent, moderate: Secondary | ICD-10-CM | POA: Diagnosis not present

## 2020-02-03 DIAGNOSIS — L814 Other melanin hyperpigmentation: Secondary | ICD-10-CM | POA: Diagnosis not present

## 2020-02-03 DIAGNOSIS — D485 Neoplasm of uncertain behavior of skin: Secondary | ICD-10-CM | POA: Diagnosis not present

## 2020-02-03 DIAGNOSIS — L309 Dermatitis, unspecified: Secondary | ICD-10-CM | POA: Diagnosis not present

## 2020-02-03 DIAGNOSIS — L219 Seborrheic dermatitis, unspecified: Secondary | ICD-10-CM | POA: Diagnosis not present

## 2020-02-03 DIAGNOSIS — L570 Actinic keratosis: Secondary | ICD-10-CM | POA: Diagnosis not present

## 2020-02-03 DIAGNOSIS — D225 Melanocytic nevi of trunk: Secondary | ICD-10-CM | POA: Diagnosis not present

## 2020-02-03 DIAGNOSIS — L821 Other seborrheic keratosis: Secondary | ICD-10-CM | POA: Diagnosis not present

## 2020-02-03 DIAGNOSIS — L578 Other skin changes due to chronic exposure to nonionizing radiation: Secondary | ICD-10-CM | POA: Diagnosis not present

## 2020-02-03 DIAGNOSIS — Z85828 Personal history of other malignant neoplasm of skin: Secondary | ICD-10-CM | POA: Diagnosis not present

## 2020-02-04 DIAGNOSIS — H33023 Retinal detachment with multiple breaks, bilateral: Secondary | ICD-10-CM | POA: Diagnosis not present

## 2020-02-04 DIAGNOSIS — H30133 Disseminated chorioretinal inflammation, generalized, bilateral: Secondary | ICD-10-CM | POA: Diagnosis not present

## 2020-02-04 DIAGNOSIS — H44113 Panuveitis, bilateral: Secondary | ICD-10-CM | POA: Diagnosis not present

## 2020-02-04 DIAGNOSIS — H3523 Other non-diabetic proliferative retinopathy, bilateral: Secondary | ICD-10-CM | POA: Diagnosis not present

## 2020-02-04 DIAGNOSIS — H35353 Cystoid macular degeneration, bilateral: Secondary | ICD-10-CM | POA: Diagnosis not present

## 2020-02-12 DIAGNOSIS — C155 Malignant neoplasm of lower third of esophagus: Secondary | ICD-10-CM | POA: Diagnosis not present

## 2020-02-12 DIAGNOSIS — Z08 Encounter for follow-up examination after completed treatment for malignant neoplasm: Secondary | ICD-10-CM | POA: Diagnosis not present

## 2020-02-12 DIAGNOSIS — N400 Enlarged prostate without lower urinary tract symptoms: Secondary | ICD-10-CM | POA: Diagnosis not present

## 2020-02-12 DIAGNOSIS — Z87891 Personal history of nicotine dependence: Secondary | ICD-10-CM | POA: Diagnosis not present

## 2020-02-12 DIAGNOSIS — R1310 Dysphagia, unspecified: Secondary | ICD-10-CM | POA: Diagnosis not present

## 2020-02-12 DIAGNOSIS — S199XXD Unspecified injury of neck, subsequent encounter: Secondary | ICD-10-CM | POA: Diagnosis not present

## 2020-02-12 DIAGNOSIS — C159 Malignant neoplasm of esophagus, unspecified: Secondary | ICD-10-CM | POA: Diagnosis not present

## 2020-02-12 DIAGNOSIS — C154 Malignant neoplasm of middle third of esophagus: Secondary | ICD-10-CM | POA: Diagnosis not present

## 2020-02-12 DIAGNOSIS — F32A Depression, unspecified: Secondary | ICD-10-CM | POA: Diagnosis not present

## 2020-02-12 DIAGNOSIS — Z923 Personal history of irradiation: Secondary | ICD-10-CM | POA: Diagnosis not present

## 2020-02-12 DIAGNOSIS — Z8501 Personal history of malignant neoplasm of esophagus: Secondary | ICD-10-CM | POA: Diagnosis not present

## 2020-02-12 DIAGNOSIS — H547 Unspecified visual loss: Secondary | ICD-10-CM | POA: Diagnosis not present

## 2020-02-24 DIAGNOSIS — I10 Essential (primary) hypertension: Secondary | ICD-10-CM | POA: Diagnosis not present

## 2020-02-24 DIAGNOSIS — I2510 Atherosclerotic heart disease of native coronary artery without angina pectoris: Secondary | ICD-10-CM | POA: Diagnosis not present

## 2020-02-24 DIAGNOSIS — N401 Enlarged prostate with lower urinary tract symptoms: Secondary | ICD-10-CM | POA: Diagnosis not present

## 2020-02-24 DIAGNOSIS — M1990 Unspecified osteoarthritis, unspecified site: Secondary | ICD-10-CM | POA: Diagnosis not present

## 2020-02-24 DIAGNOSIS — D500 Iron deficiency anemia secondary to blood loss (chronic): Secondary | ICD-10-CM | POA: Diagnosis not present

## 2020-02-24 DIAGNOSIS — F332 Major depressive disorder, recurrent severe without psychotic features: Secondary | ICD-10-CM | POA: Diagnosis not present

## 2020-02-24 DIAGNOSIS — N400 Enlarged prostate without lower urinary tract symptoms: Secondary | ICD-10-CM | POA: Diagnosis not present

## 2020-02-24 DIAGNOSIS — E782 Mixed hyperlipidemia: Secondary | ICD-10-CM | POA: Diagnosis not present

## 2020-02-25 DIAGNOSIS — H5410 Blindness, one eye, low vision other eye, unspecified eyes: Secondary | ICD-10-CM | POA: Diagnosis not present

## 2020-02-25 DIAGNOSIS — F332 Major depressive disorder, recurrent severe without psychotic features: Secondary | ICD-10-CM | POA: Diagnosis not present

## 2020-02-29 DIAGNOSIS — F331 Major depressive disorder, recurrent, moderate: Secondary | ICD-10-CM | POA: Diagnosis not present

## 2020-02-29 DIAGNOSIS — F439 Reaction to severe stress, unspecified: Secondary | ICD-10-CM | POA: Diagnosis not present

## 2020-03-10 IMAGING — CT CT ABD-PELV W/ CM
3 series · 14 of 32 positions shown, 18 images · IV contrast (APPLIED)
Comparison: None.

CLINICAL DATA: Newly diagnosed esophageal cancer

EXAM:
CT CHEST, ABDOMEN, AND PELVIS WITH CONTRAST
TECHNIQUE: Multidetector CT imaging of the chest, abdomen and pelvis was
performed following the standard protocol during bolus
administration of intravenous contrast.
CONTRAST:  100mL PGATC1-PCC IOPAMIDOL (PGATC1-PCC) INJECTION 61%

[Series 2: chest/abd/pelvis w/cm · axial · 0.78mm/px · z∈[-650,-105]mm · 8 of 131 slices shown]
[im 11/131  soft-tissue]
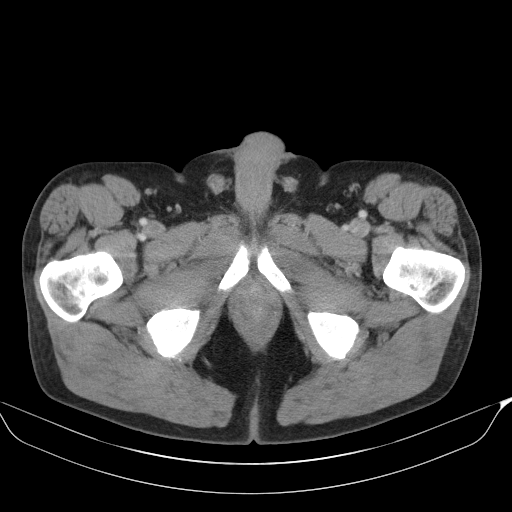
[im 33/131  soft-tissue]
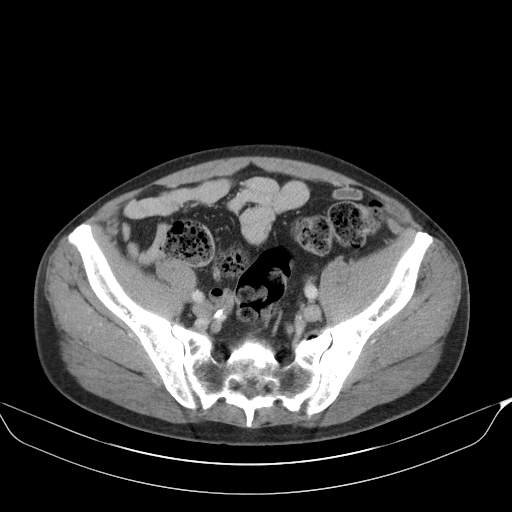
[im 44/131  soft-tissue]
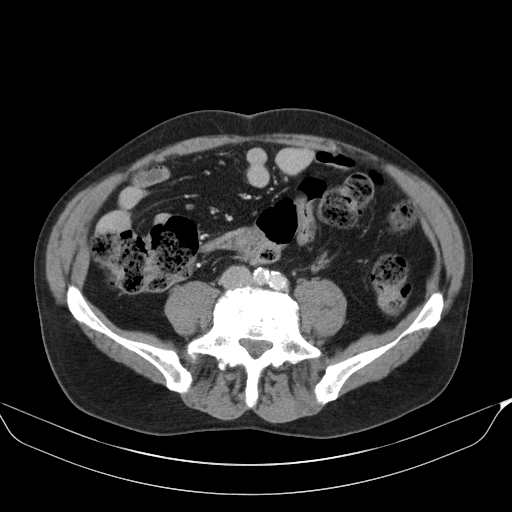
[im 55/131  soft-tissue]
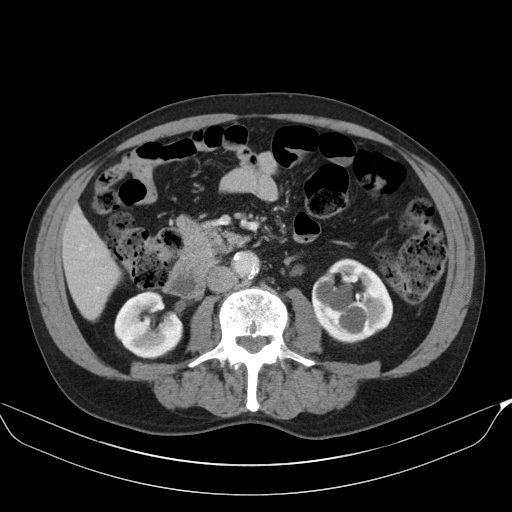
[im 76/131  soft-tissue]
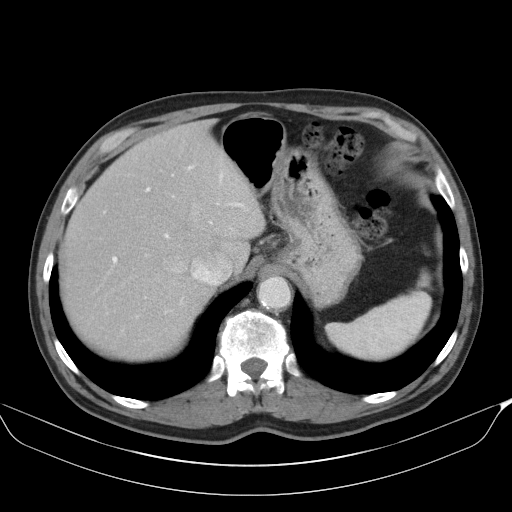
[im 87/131  soft-tissue]
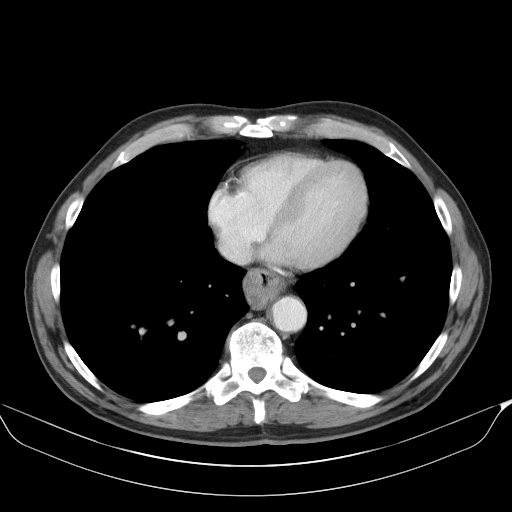
[im 98/131  soft-tissue]
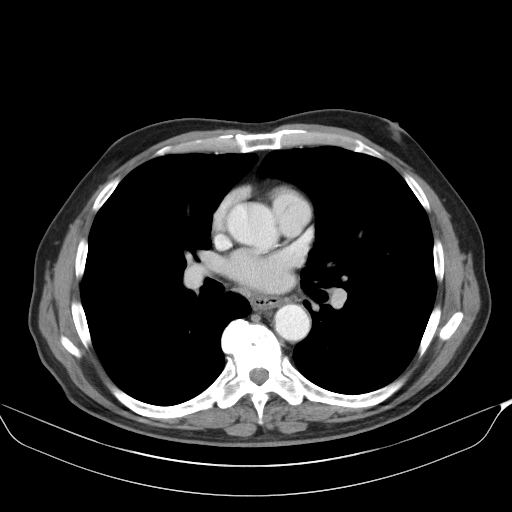
[im 120/131  soft-tissue]
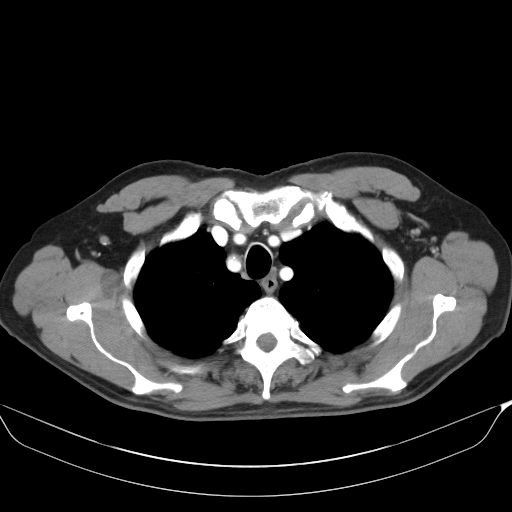

[Series 3: renal delay · axial · delayed · 0.78mm/px · z∈[-484,-329]mm · 3 of 32 slices shown, 7 images]
[im 1/32  soft-tissue]
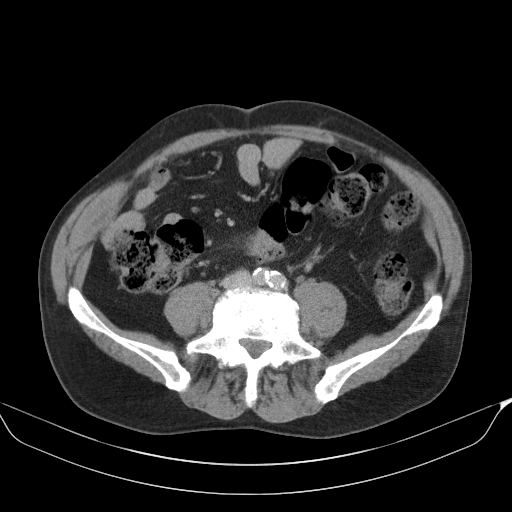
[im 1/32  lung]
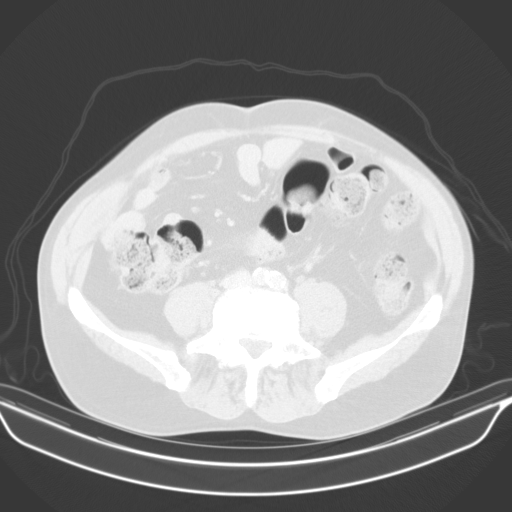
[im 1/32  bone]
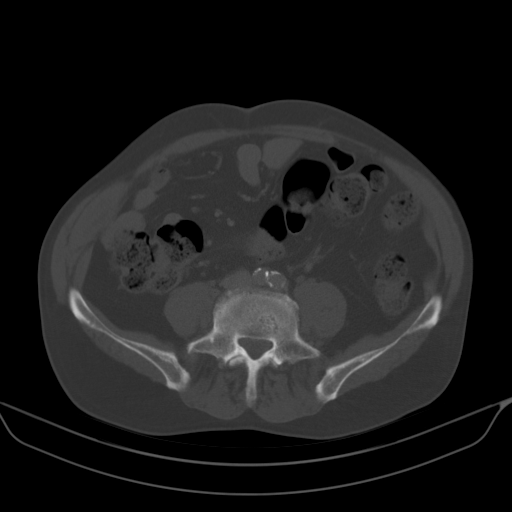
[im 16/32  soft-tissue]
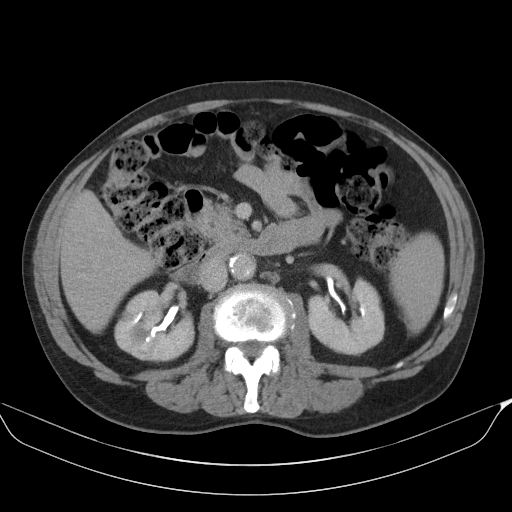
[im 16/32  lung]
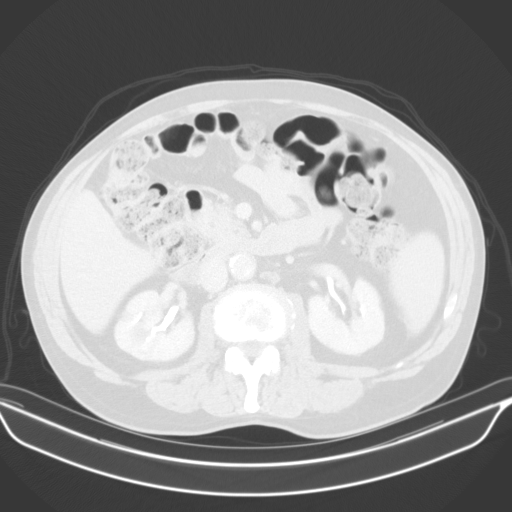
[im 32/32  soft-tissue]
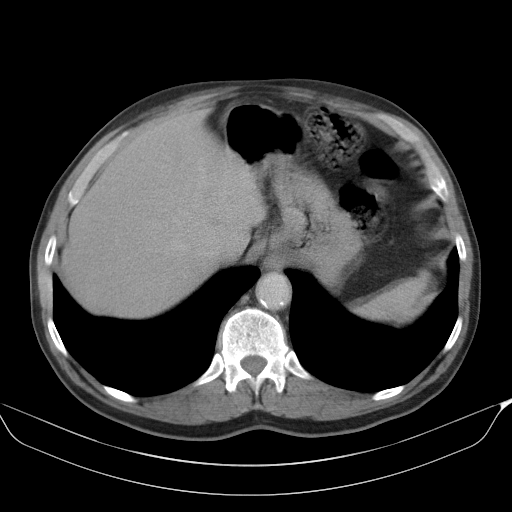
[im 32/32  lung]
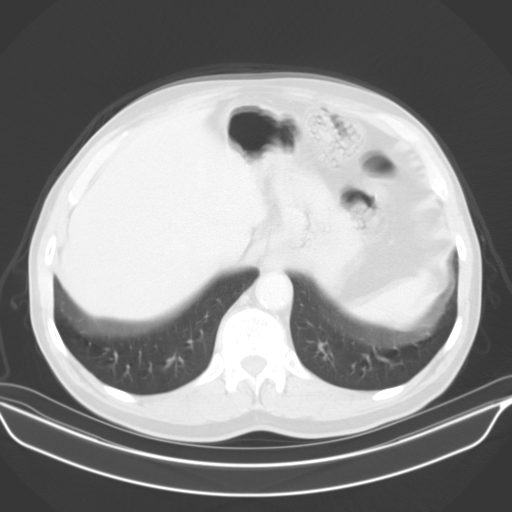

[Series 7: lung · axial · 0.78mm/px · z∈[-346,-262]mm · 3 of 159 slices shown]
[im 11/159  bone]
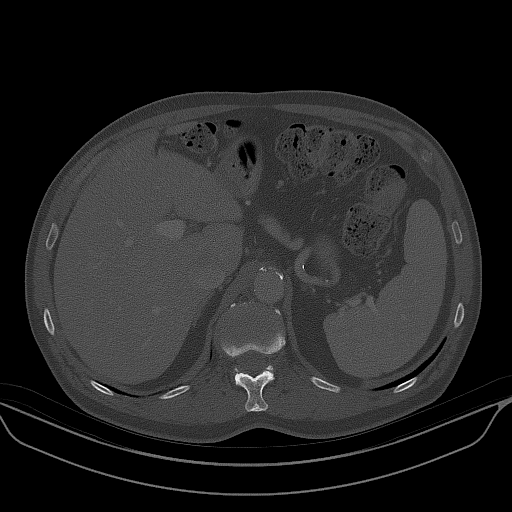
[im 32/159  bone]
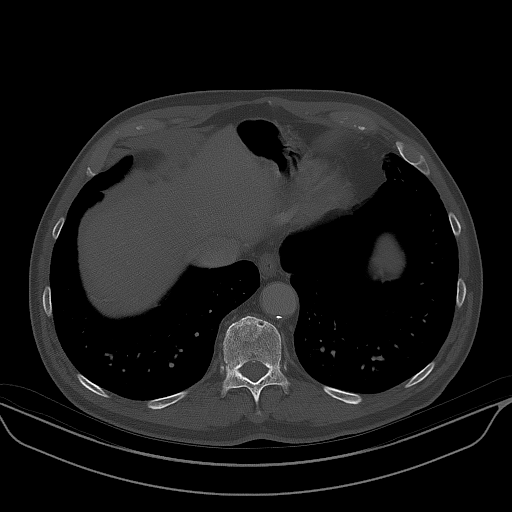
[im 53/159  bone]
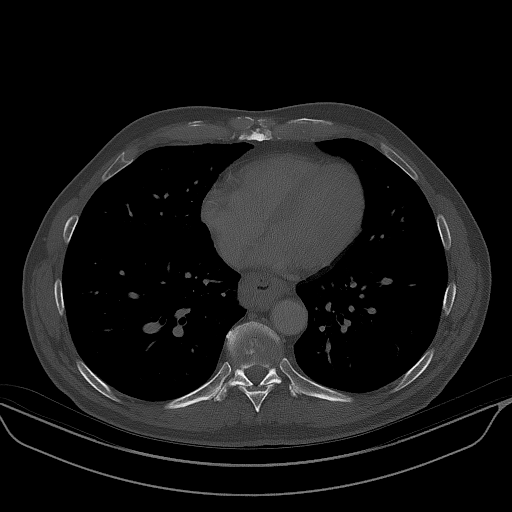

[14 of 32 positions shown; findings below may reference images not displayed]

FINDINGS: CT CHEST FINDINGS

Cardiovascular: The heart is normal in size. No pericardial
effusion.

Ectasia of the ascending thoracic aorta, measuring 3.9 cm.
Atherosclerotic calcifications of the aortic arch with eccentric
mural thrombus along the proximal descending thoracic aorta (series
2/image 21).

Mild coronary atherosclerosis the LAD and right coronary artery.

Mediastinum/Nodes: Masslike wall thickening involving the distal
esophagus (series 2/image 42), corresponding to the patient's newly
diagnosed esophageal cancer.

7 mm short axis subcarinal node (series 2/image 32). 8 mm short axis
low right paratracheal node (series 2/image 25), although with
preservation of the normal fatty hilum.

Visualized thyroid is unremarkable.

Lungs/Pleura: Moderate centrilobular and paraseptal emphysematous
changes, upper lobe predominant.

Scattered bilateral pulmonary nodules measuring 2-3 mm (series
2/images 21, 28, 41, 48, 49, and 51), indeterminate.

No focal consolidation.

No pleural effusion or pneumothorax.

Musculoskeletal: Degenerative changes of the thoracic spine.

CT ABDOMEN PELVIS FINDINGS

Hepatobiliary: Liver is within normal limits. No
suspicious/enhancing hepatic lesions.

Gallbladder is unremarkable. No intrahepatic or extrahepatic ductal
dilatation.

Pancreas: Within normal limits.

Spleen: Within normal limits.

Adrenals/Urinary Tract: Adrenal glands are within normal limits.

Two nonobstructing right lower pole renal calculi measuring 2 mm
(series 2/image 80). Left lower pole renal cortical scarring.
Bilateral renal cysts measuring up to 3.2 cm in the left lower pole.
No hydronephrosis.

Bladder is within normal limits.

Stomach/Bowel: Stomach is within normal limits.

No evidence of bowel obstruction.

Normal appendix (series 2/image 101).

Vascular/Lymphatic: No evidence of abdominal aortic aneurysm.

Atherosclerotic calcifications of the abdominal aorta and branch
vessels.

11 mm short axis gastrohepatic node (series 2/image 58).

Reproductive: Prostatomegaly, with enlargement of the central gland
which indents the base of the bladder.

Other: No abdominopelvic ascites.

Musculoskeletal: Degenerative changes of the lumbar spine.
IMPRESSION: Masslike wall thickening of the distal esophagus, corresponding to
the patient's newly diagnosed esophageal cancer.

Associated subcarinal and gastrohepatic nodal metastases.

Small bilateral pulmonary nodules measuring 2-3 mm, indeterminate.
Attention on follow-up is suggested.

Additional ancillary findings as above.

Aortic Atherosclerosis ([id number]-YW9.9) and Emphysema ([id number].W).

## 2020-03-17 DIAGNOSIS — H3523 Other non-diabetic proliferative retinopathy, bilateral: Secondary | ICD-10-CM | POA: Diagnosis not present

## 2020-03-17 DIAGNOSIS — H35353 Cystoid macular degeneration, bilateral: Secondary | ICD-10-CM | POA: Diagnosis not present

## 2020-03-17 DIAGNOSIS — H35053 Retinal neovascularization, unspecified, bilateral: Secondary | ICD-10-CM | POA: Diagnosis not present

## 2020-03-17 DIAGNOSIS — H30133 Disseminated chorioretinal inflammation, generalized, bilateral: Secondary | ICD-10-CM | POA: Diagnosis not present

## 2020-03-17 DIAGNOSIS — H33023 Retinal detachment with multiple breaks, bilateral: Secondary | ICD-10-CM | POA: Diagnosis not present

## 2020-03-17 DIAGNOSIS — H4313 Vitreous hemorrhage, bilateral: Secondary | ICD-10-CM | POA: Diagnosis not present

## 2020-03-17 DIAGNOSIS — H44113 Panuveitis, bilateral: Secondary | ICD-10-CM | POA: Diagnosis not present

## 2020-04-07 DIAGNOSIS — F439 Reaction to severe stress, unspecified: Secondary | ICD-10-CM | POA: Diagnosis not present

## 2020-04-07 DIAGNOSIS — F331 Major depressive disorder, recurrent, moderate: Secondary | ICD-10-CM | POA: Diagnosis not present

## 2020-04-11 DIAGNOSIS — E782 Mixed hyperlipidemia: Secondary | ICD-10-CM | POA: Diagnosis not present

## 2020-04-11 DIAGNOSIS — D500 Iron deficiency anemia secondary to blood loss (chronic): Secondary | ICD-10-CM | POA: Diagnosis not present

## 2020-04-11 DIAGNOSIS — I4891 Unspecified atrial fibrillation: Secondary | ICD-10-CM | POA: Diagnosis not present

## 2020-04-11 DIAGNOSIS — I2510 Atherosclerotic heart disease of native coronary artery without angina pectoris: Secondary | ICD-10-CM | POA: Diagnosis not present

## 2020-04-11 DIAGNOSIS — N401 Enlarged prostate with lower urinary tract symptoms: Secondary | ICD-10-CM | POA: Diagnosis not present

## 2020-04-11 DIAGNOSIS — F329 Major depressive disorder, single episode, unspecified: Secondary | ICD-10-CM | POA: Diagnosis not present

## 2020-04-11 DIAGNOSIS — M1990 Unspecified osteoarthritis, unspecified site: Secondary | ICD-10-CM | POA: Diagnosis not present

## 2020-04-11 DIAGNOSIS — G4700 Insomnia, unspecified: Secondary | ICD-10-CM | POA: Diagnosis not present

## 2020-04-11 DIAGNOSIS — I10 Essential (primary) hypertension: Secondary | ICD-10-CM | POA: Diagnosis not present

## 2020-04-15 DIAGNOSIS — H547 Unspecified visual loss: Secondary | ICD-10-CM | POA: Diagnosis not present

## 2020-04-15 DIAGNOSIS — E782 Mixed hyperlipidemia: Secondary | ICD-10-CM | POA: Diagnosis not present

## 2020-04-15 DIAGNOSIS — I10 Essential (primary) hypertension: Secondary | ICD-10-CM | POA: Diagnosis not present

## 2020-04-15 DIAGNOSIS — N401 Enlarged prostate with lower urinary tract symptoms: Secondary | ICD-10-CM | POA: Diagnosis not present

## 2020-04-15 DIAGNOSIS — G4700 Insomnia, unspecified: Secondary | ICD-10-CM | POA: Diagnosis not present

## 2020-04-22 DIAGNOSIS — F909 Attention-deficit hyperactivity disorder, unspecified type: Secondary | ICD-10-CM | POA: Diagnosis not present

## 2020-04-22 DIAGNOSIS — I779 Disorder of arteries and arterioles, unspecified: Secondary | ICD-10-CM | POA: Diagnosis not present

## 2020-04-22 DIAGNOSIS — N400 Enlarged prostate without lower urinary tract symptoms: Secondary | ICD-10-CM | POA: Diagnosis not present

## 2020-04-22 DIAGNOSIS — E782 Mixed hyperlipidemia: Secondary | ICD-10-CM | POA: Diagnosis not present

## 2020-04-22 DIAGNOSIS — R7301 Impaired fasting glucose: Secondary | ICD-10-CM | POA: Diagnosis not present

## 2020-04-22 DIAGNOSIS — I2510 Atherosclerotic heart disease of native coronary artery without angina pectoris: Secondary | ICD-10-CM | POA: Diagnosis not present

## 2020-04-22 DIAGNOSIS — Z9181 History of falling: Secondary | ICD-10-CM | POA: Diagnosis not present

## 2020-04-22 DIAGNOSIS — I4891 Unspecified atrial fibrillation: Secondary | ICD-10-CM | POA: Diagnosis not present

## 2020-04-22 DIAGNOSIS — Z86010 Personal history of colonic polyps: Secondary | ICD-10-CM | POA: Diagnosis not present

## 2020-04-22 DIAGNOSIS — H5410 Blindness, one eye, low vision other eye, unspecified eyes: Secondary | ICD-10-CM | POA: Diagnosis not present

## 2020-04-22 DIAGNOSIS — Z87442 Personal history of urinary calculi: Secondary | ICD-10-CM | POA: Diagnosis not present

## 2020-04-22 DIAGNOSIS — Z87891 Personal history of nicotine dependence: Secondary | ICD-10-CM | POA: Diagnosis not present

## 2020-04-22 DIAGNOSIS — L570 Actinic keratosis: Secondary | ICD-10-CM | POA: Diagnosis not present

## 2020-04-22 DIAGNOSIS — N200 Calculus of kidney: Secondary | ICD-10-CM | POA: Diagnosis not present

## 2020-04-22 DIAGNOSIS — Z8501 Personal history of malignant neoplasm of esophagus: Secondary | ICD-10-CM | POA: Diagnosis not present

## 2020-04-22 DIAGNOSIS — D126 Benign neoplasm of colon, unspecified: Secondary | ICD-10-CM | POA: Diagnosis not present

## 2020-04-22 DIAGNOSIS — I10 Essential (primary) hypertension: Secondary | ICD-10-CM | POA: Diagnosis not present

## 2020-04-22 DIAGNOSIS — K52831 Collagenous colitis: Secondary | ICD-10-CM | POA: Diagnosis not present

## 2020-04-22 DIAGNOSIS — D500 Iron deficiency anemia secondary to blood loss (chronic): Secondary | ICD-10-CM | POA: Diagnosis not present

## 2020-04-22 DIAGNOSIS — M1712 Unilateral primary osteoarthritis, left knee: Secondary | ICD-10-CM | POA: Diagnosis not present

## 2020-04-22 DIAGNOSIS — F329 Major depressive disorder, single episode, unspecified: Secondary | ICD-10-CM | POA: Diagnosis not present

## 2020-04-22 DIAGNOSIS — F332 Major depressive disorder, recurrent severe without psychotic features: Secondary | ICD-10-CM | POA: Diagnosis not present

## 2020-05-04 DIAGNOSIS — Z23 Encounter for immunization: Secondary | ICD-10-CM | POA: Diagnosis not present

## 2020-05-22 DIAGNOSIS — I10 Essential (primary) hypertension: Secondary | ICD-10-CM | POA: Diagnosis not present

## 2020-05-22 DIAGNOSIS — D500 Iron deficiency anemia secondary to blood loss (chronic): Secondary | ICD-10-CM | POA: Diagnosis not present

## 2020-05-22 DIAGNOSIS — N400 Enlarged prostate without lower urinary tract symptoms: Secondary | ICD-10-CM | POA: Diagnosis not present

## 2020-05-22 DIAGNOSIS — L570 Actinic keratosis: Secondary | ICD-10-CM | POA: Diagnosis not present

## 2020-05-22 DIAGNOSIS — Z86010 Personal history of colonic polyps: Secondary | ICD-10-CM | POA: Diagnosis not present

## 2020-05-22 DIAGNOSIS — E782 Mixed hyperlipidemia: Secondary | ICD-10-CM | POA: Diagnosis not present

## 2020-05-22 DIAGNOSIS — F909 Attention-deficit hyperactivity disorder, unspecified type: Secondary | ICD-10-CM | POA: Diagnosis not present

## 2020-05-22 DIAGNOSIS — I779 Disorder of arteries and arterioles, unspecified: Secondary | ICD-10-CM | POA: Diagnosis not present

## 2020-05-22 DIAGNOSIS — Z8501 Personal history of malignant neoplasm of esophagus: Secondary | ICD-10-CM | POA: Diagnosis not present

## 2020-05-22 DIAGNOSIS — Z87891 Personal history of nicotine dependence: Secondary | ICD-10-CM | POA: Diagnosis not present

## 2020-05-22 DIAGNOSIS — D126 Benign neoplasm of colon, unspecified: Secondary | ICD-10-CM | POA: Diagnosis not present

## 2020-05-22 DIAGNOSIS — H5410 Blindness, one eye, low vision other eye, unspecified eyes: Secondary | ICD-10-CM | POA: Diagnosis not present

## 2020-05-22 DIAGNOSIS — I4891 Unspecified atrial fibrillation: Secondary | ICD-10-CM | POA: Diagnosis not present

## 2020-05-22 DIAGNOSIS — R7301 Impaired fasting glucose: Secondary | ICD-10-CM | POA: Diagnosis not present

## 2020-05-22 DIAGNOSIS — K52831 Collagenous colitis: Secondary | ICD-10-CM | POA: Diagnosis not present

## 2020-05-22 DIAGNOSIS — F332 Major depressive disorder, recurrent severe without psychotic features: Secondary | ICD-10-CM | POA: Diagnosis not present

## 2020-05-22 DIAGNOSIS — I2510 Atherosclerotic heart disease of native coronary artery without angina pectoris: Secondary | ICD-10-CM | POA: Diagnosis not present

## 2020-05-22 DIAGNOSIS — F329 Major depressive disorder, single episode, unspecified: Secondary | ICD-10-CM | POA: Diagnosis not present

## 2020-05-22 DIAGNOSIS — M1712 Unilateral primary osteoarthritis, left knee: Secondary | ICD-10-CM | POA: Diagnosis not present

## 2020-05-22 DIAGNOSIS — Z87442 Personal history of urinary calculi: Secondary | ICD-10-CM | POA: Diagnosis not present

## 2020-05-22 DIAGNOSIS — N200 Calculus of kidney: Secondary | ICD-10-CM | POA: Diagnosis not present

## 2020-05-22 DIAGNOSIS — Z9181 History of falling: Secondary | ICD-10-CM | POA: Diagnosis not present

## 2020-05-25 DIAGNOSIS — E782 Mixed hyperlipidemia: Secondary | ICD-10-CM | POA: Diagnosis not present

## 2020-05-25 DIAGNOSIS — I2510 Atherosclerotic heart disease of native coronary artery without angina pectoris: Secondary | ICD-10-CM | POA: Diagnosis not present

## 2020-05-25 DIAGNOSIS — N401 Enlarged prostate with lower urinary tract symptoms: Secondary | ICD-10-CM | POA: Diagnosis not present

## 2020-05-25 DIAGNOSIS — G4700 Insomnia, unspecified: Secondary | ICD-10-CM | POA: Diagnosis not present

## 2020-05-25 DIAGNOSIS — M1990 Unspecified osteoarthritis, unspecified site: Secondary | ICD-10-CM | POA: Diagnosis not present

## 2020-05-25 DIAGNOSIS — F332 Major depressive disorder, recurrent severe without psychotic features: Secondary | ICD-10-CM | POA: Diagnosis not present

## 2020-05-25 DIAGNOSIS — N400 Enlarged prostate without lower urinary tract symptoms: Secondary | ICD-10-CM | POA: Diagnosis not present

## 2020-05-25 DIAGNOSIS — I4891 Unspecified atrial fibrillation: Secondary | ICD-10-CM | POA: Diagnosis not present

## 2020-05-25 DIAGNOSIS — F329 Major depressive disorder, single episode, unspecified: Secondary | ICD-10-CM | POA: Diagnosis not present

## 2020-05-25 DIAGNOSIS — I10 Essential (primary) hypertension: Secondary | ICD-10-CM | POA: Diagnosis not present

## 2020-05-25 DIAGNOSIS — D500 Iron deficiency anemia secondary to blood loss (chronic): Secondary | ICD-10-CM | POA: Diagnosis not present

## 2020-05-26 DIAGNOSIS — F331 Major depressive disorder, recurrent, moderate: Secondary | ICD-10-CM | POA: Diagnosis not present

## 2020-05-26 DIAGNOSIS — F439 Reaction to severe stress, unspecified: Secondary | ICD-10-CM | POA: Diagnosis not present

## 2020-06-23 DIAGNOSIS — H33023 Retinal detachment with multiple breaks, bilateral: Secondary | ICD-10-CM | POA: Diagnosis not present

## 2020-06-23 DIAGNOSIS — H35353 Cystoid macular degeneration, bilateral: Secondary | ICD-10-CM | POA: Diagnosis not present

## 2020-06-23 DIAGNOSIS — H30133 Disseminated chorioretinal inflammation, generalized, bilateral: Secondary | ICD-10-CM | POA: Diagnosis not present

## 2020-06-23 DIAGNOSIS — H35053 Retinal neovascularization, unspecified, bilateral: Secondary | ICD-10-CM | POA: Diagnosis not present

## 2020-06-23 DIAGNOSIS — H44113 Panuveitis, bilateral: Secondary | ICD-10-CM | POA: Diagnosis not present

## 2020-06-23 DIAGNOSIS — H3523 Other non-diabetic proliferative retinopathy, bilateral: Secondary | ICD-10-CM | POA: Diagnosis not present

## 2020-06-23 DIAGNOSIS — H4313 Vitreous hemorrhage, bilateral: Secondary | ICD-10-CM | POA: Diagnosis not present

## 2020-06-29 DIAGNOSIS — F331 Major depressive disorder, recurrent, moderate: Secondary | ICD-10-CM | POA: Diagnosis not present

## 2020-06-29 DIAGNOSIS — Z79899 Other long term (current) drug therapy: Secondary | ICD-10-CM | POA: Diagnosis not present

## 2020-06-29 DIAGNOSIS — F439 Reaction to severe stress, unspecified: Secondary | ICD-10-CM | POA: Diagnosis not present

## 2020-07-21 DIAGNOSIS — I4891 Unspecified atrial fibrillation: Secondary | ICD-10-CM | POA: Diagnosis not present

## 2020-07-21 DIAGNOSIS — F329 Major depressive disorder, single episode, unspecified: Secondary | ICD-10-CM | POA: Diagnosis not present

## 2020-07-21 DIAGNOSIS — E782 Mixed hyperlipidemia: Secondary | ICD-10-CM | POA: Diagnosis not present

## 2020-07-21 DIAGNOSIS — D500 Iron deficiency anemia secondary to blood loss (chronic): Secondary | ICD-10-CM | POA: Diagnosis not present

## 2020-07-21 DIAGNOSIS — N401 Enlarged prostate with lower urinary tract symptoms: Secondary | ICD-10-CM | POA: Diagnosis not present

## 2020-07-21 DIAGNOSIS — G4700 Insomnia, unspecified: Secondary | ICD-10-CM | POA: Diagnosis not present

## 2020-07-21 DIAGNOSIS — I2510 Atherosclerotic heart disease of native coronary artery without angina pectoris: Secondary | ICD-10-CM | POA: Diagnosis not present

## 2020-07-21 DIAGNOSIS — N400 Enlarged prostate without lower urinary tract symptoms: Secondary | ICD-10-CM | POA: Diagnosis not present

## 2020-07-21 DIAGNOSIS — I10 Essential (primary) hypertension: Secondary | ICD-10-CM | POA: Diagnosis not present

## 2020-07-21 DIAGNOSIS — M1990 Unspecified osteoarthritis, unspecified site: Secondary | ICD-10-CM | POA: Diagnosis not present

## 2020-08-03 DIAGNOSIS — F419 Anxiety disorder, unspecified: Secondary | ICD-10-CM | POA: Diagnosis not present

## 2020-08-03 DIAGNOSIS — Z79899 Other long term (current) drug therapy: Secondary | ICD-10-CM | POA: Diagnosis not present

## 2020-08-03 DIAGNOSIS — F331 Major depressive disorder, recurrent, moderate: Secondary | ICD-10-CM | POA: Diagnosis not present

## 2020-08-03 DIAGNOSIS — F439 Reaction to severe stress, unspecified: Secondary | ICD-10-CM | POA: Diagnosis not present

## 2020-08-12 DIAGNOSIS — C155 Malignant neoplasm of lower third of esophagus: Secondary | ICD-10-CM | POA: Diagnosis not present

## 2020-08-12 DIAGNOSIS — Z79899 Other long term (current) drug therapy: Secondary | ICD-10-CM | POA: Diagnosis not present

## 2020-08-12 DIAGNOSIS — H30132 Disseminated chorioretinal inflammation, generalized, left eye: Secondary | ICD-10-CM | POA: Diagnosis not present

## 2020-08-12 DIAGNOSIS — Z8501 Personal history of malignant neoplasm of esophagus: Secondary | ICD-10-CM | POA: Diagnosis not present

## 2020-08-12 DIAGNOSIS — C159 Malignant neoplasm of esophagus, unspecified: Secondary | ICD-10-CM | POA: Diagnosis not present

## 2020-08-12 DIAGNOSIS — Z8619 Personal history of other infectious and parasitic diseases: Secondary | ICD-10-CM | POA: Diagnosis not present

## 2020-08-12 DIAGNOSIS — C154 Malignant neoplasm of middle third of esophagus: Secondary | ICD-10-CM | POA: Diagnosis not present

## 2020-08-12 DIAGNOSIS — Z08 Encounter for follow-up examination after completed treatment for malignant neoplasm: Secondary | ICD-10-CM | POA: Diagnosis not present

## 2020-08-12 DIAGNOSIS — Z87891 Personal history of nicotine dependence: Secondary | ICD-10-CM | POA: Diagnosis not present

## 2020-09-12 ENCOUNTER — Observation Stay (HOSPITAL_COMMUNITY): Payer: Medicare Other

## 2020-09-12 ENCOUNTER — Emergency Department (HOSPITAL_COMMUNITY): Payer: Medicare Other

## 2020-09-12 ENCOUNTER — Encounter (HOSPITAL_COMMUNITY): Payer: Self-pay | Admitting: *Deleted

## 2020-09-12 ENCOUNTER — Observation Stay (HOSPITAL_COMMUNITY)
Admission: EM | Admit: 2020-09-12 | Discharge: 2020-09-12 | Disposition: A | Discharge status: Home / Self Care | Payer: Medicare Other | Attending: Internal Medicine | Admitting: Internal Medicine

## 2020-09-12 ENCOUNTER — Other Ambulatory Visit: Payer: Self-pay

## 2020-09-12 DIAGNOSIS — Z87891 Personal history of nicotine dependence: Secondary | ICD-10-CM | POA: Diagnosis not present

## 2020-09-12 DIAGNOSIS — H543 Unqualified visual loss, both eyes: Secondary | ICD-10-CM | POA: Diagnosis present

## 2020-09-12 DIAGNOSIS — R4182 Altered mental status, unspecified: Secondary | ICD-10-CM | POA: Diagnosis not present

## 2020-09-12 DIAGNOSIS — R9431 Abnormal electrocardiogram [ECG] [EKG]: Secondary | ICD-10-CM | POA: Diagnosis not present

## 2020-09-12 DIAGNOSIS — Y900 Blood alcohol level of less than 20 mg/100 ml: Secondary | ICD-10-CM | POA: Diagnosis not present

## 2020-09-12 DIAGNOSIS — Z8501 Personal history of malignant neoplasm of esophagus: Secondary | ICD-10-CM | POA: Diagnosis not present

## 2020-09-12 DIAGNOSIS — Z96652 Presence of left artificial knee joint: Secondary | ICD-10-CM | POA: Insufficient documentation

## 2020-09-12 DIAGNOSIS — Z7982 Long term (current) use of aspirin: Secondary | ICD-10-CM | POA: Diagnosis not present

## 2020-09-12 DIAGNOSIS — R29818 Other symptoms and signs involving the nervous system: Secondary | ICD-10-CM | POA: Diagnosis not present

## 2020-09-12 DIAGNOSIS — Z79899 Other long term (current) drug therapy: Secondary | ICD-10-CM | POA: Diagnosis not present

## 2020-09-12 DIAGNOSIS — Z20822 Contact with and (suspected) exposure to covid-19: Secondary | ICD-10-CM | POA: Diagnosis not present

## 2020-09-12 DIAGNOSIS — I700 Atherosclerosis of aorta: Secondary | ICD-10-CM | POA: Diagnosis not present

## 2020-09-12 DIAGNOSIS — I10 Essential (primary) hypertension: Secondary | ICD-10-CM | POA: Diagnosis present

## 2020-09-12 DIAGNOSIS — R531 Weakness: Secondary | ICD-10-CM

## 2020-09-12 DIAGNOSIS — C159 Malignant neoplasm of esophagus, unspecified: Secondary | ICD-10-CM | POA: Diagnosis not present

## 2020-09-12 DIAGNOSIS — R441 Visual hallucinations: Principal | ICD-10-CM | POA: Diagnosis present

## 2020-09-12 DIAGNOSIS — J439 Emphysema, unspecified: Secondary | ICD-10-CM | POA: Diagnosis not present

## 2020-09-12 DIAGNOSIS — R42 Dizziness and giddiness: Secondary | ICD-10-CM | POA: Diagnosis not present

## 2020-09-12 DIAGNOSIS — R410 Disorientation, unspecified: Secondary | ICD-10-CM | POA: Diagnosis not present

## 2020-09-12 DIAGNOSIS — R443 Hallucinations, unspecified: Secondary | ICD-10-CM | POA: Diagnosis not present

## 2020-09-12 DIAGNOSIS — I2510 Atherosclerotic heart disease of native coronary artery without angina pectoris: Secondary | ICD-10-CM | POA: Insufficient documentation

## 2020-09-12 LAB — CBC
HCT: 33.8 % — ABNORMAL LOW (ref 39.0–52.0)
Hemoglobin: 12 g/dL — ABNORMAL LOW (ref 13.0–17.0)
MCH: 35.5 pg — ABNORMAL HIGH (ref 26.0–34.0)
MCHC: 35.5 g/dL (ref 30.0–36.0)
MCV: 100 fL (ref 80.0–100.0)
Platelets: 122 10*3/uL — ABNORMAL LOW (ref 150–400)
RBC: 3.38 MIL/uL — ABNORMAL LOW (ref 4.22–5.81)
RDW: 13.5 % (ref 11.5–15.5)
WBC: 5.6 10*3/uL (ref 4.0–10.5)
nRBC: 0 % (ref 0.0–0.2)

## 2020-09-12 LAB — COMPREHENSIVE METABOLIC PANEL
ALT: 35 U/L (ref 0–44)
AST: 33 U/L (ref 15–41)
Albumin: 3.7 g/dL (ref 3.5–5.0)
Alkaline Phosphatase: 66 U/L (ref 38–126)
Anion gap: 7 (ref 5–15)
BUN: 21 mg/dL (ref 8–23)
CO2: 26 mmol/L (ref 22–32)
Calcium: 9.4 mg/dL (ref 8.9–10.3)
Chloride: 104 mmol/L (ref 98–111)
Creatinine, Ser: 0.98 mg/dL (ref 0.61–1.24)
GFR, Estimated: >60 mL/min (ref >60)
Glucose, Bld: 101 mg/dL — ABNORMAL HIGH (ref 70–99)
Potassium: 4 mmol/L (ref 3.5–5.1)
Sodium: 137 mmol/L (ref 135–145)
Total Bilirubin: 1 mg/dL (ref 0.3–1.2)
Total Protein: 6.3 g/dL — ABNORMAL LOW (ref 6.5–8.1)

## 2020-09-12 LAB — URINALYSIS, ROUTINE W REFLEX MICROSCOPIC
Bilirubin Urine: NEGATIVE
Glucose, UA: NEGATIVE mg/dL
Hgb urine dipstick: NEGATIVE
Ketones, ur: NEGATIVE mg/dL
Nitrite: NEGATIVE
Protein, ur: NEGATIVE mg/dL
Specific Gravity, Urine: 1.01 (ref 1.005–1.030)
pH: 5 (ref 5.0–8.0)

## 2020-09-12 LAB — DIFFERENTIAL
Abs Immature Granulocytes: 0.01 10*3/uL (ref 0.00–0.07)
Basophils Absolute: 0 10*3/uL (ref 0.0–0.1)
Basophils Relative: 1 %
Eosinophils Absolute: 0.3 10*3/uL (ref 0.0–0.5)
Eosinophils Relative: 5 %
Immature Granulocytes: 0 %
Lymphocytes Relative: 15 %
Lymphs Abs: 0.9 10*3/uL (ref 0.7–4.0)
Monocytes Absolute: 0.4 10*3/uL (ref 0.1–1.0)
Monocytes Relative: 8 %
Neutro Abs: 4 10*3/uL (ref 1.7–7.7)
Neutrophils Relative %: 71 %

## 2020-09-12 LAB — RESP PANEL BY RT-PCR (FLU A&B, COVID) ARPGX2
Influenza A by PCR: NEGATIVE
Influenza B by PCR: NEGATIVE
SARS Coronavirus 2 by RT PCR: NEGATIVE

## 2020-09-12 LAB — RAPID URINE DRUG SCREEN, HOSP PERFORMED
Amphetamines: NOT DETECTED
Barbiturates: NOT DETECTED
Benzodiazepines: NOT DETECTED
Cocaine: NOT DETECTED
Opiates: NOT DETECTED
Tetrahydrocannabinol: NOT DETECTED

## 2020-09-12 LAB — I-STAT CHEM 8, ED
BUN: 21 mg/dL (ref 8–23)
Calcium, Ion: 1.34 mmol/L (ref 1.15–1.40)
Chloride: 102 mmol/L (ref 98–111)
Creatinine, Ser: 1.1 mg/dL (ref 0.61–1.24)
Glucose, Bld: 95 mg/dL (ref 70–99)
HCT: 32 % — ABNORMAL LOW (ref 39.0–52.0)
Hemoglobin: 10.9 g/dL — ABNORMAL LOW (ref 13.0–17.0)
Potassium: 4.1 mmol/L (ref 3.5–5.1)
Sodium: 139 mmol/L (ref 135–145)
TCO2: 27 mmol/L (ref 22–32)

## 2020-09-12 LAB — AMMONIA: Ammonia: <9 umol/L — ABNORMAL LOW (ref 9–35)

## 2020-09-12 LAB — PROTIME-INR
INR: 1.1 (ref 0.8–1.2)
Prothrombin Time: 14.4 seconds (ref 11.4–15.2)

## 2020-09-12 LAB — CBG MONITORING, ED: Glucose-Capillary: 92 mg/dL (ref 70–99)

## 2020-09-12 LAB — APTT: aPTT: 31 seconds (ref 24–36)

## 2020-09-12 LAB — ETHANOL: Alcohol, Ethyl (B): <10 mg/dL (ref <10)

## 2020-09-12 MED ORDER — TRAZODONE HCL 50 MG PO TABS: 150.0000 mg | ORAL_TABLET | Freq: Every day | ORAL | Status: DC

## 2020-09-12 MED ORDER — ATROPINE SULFATE 1 % OP SOLN
1.0000 [drp] | Freq: Every day | OPHTHALMIC | Status: DC
  Filled 2020-09-12: qty 2

## 2020-09-12 MED ORDER — VALACYCLOVIR HCL 500 MG PO TABS
1000.0000 mg | ORAL_TABLET | Freq: Every day | ORAL | Status: DC
  Administered 2020-09-12: 1000 mg via ORAL
  Filled 2020-09-12: qty 2

## 2020-09-12 MED ORDER — ASPIRIN EC 81 MG PO TBEC
162.0000 mg | DELAYED_RELEASE_TABLET | Freq: Every day | ORAL | Status: DC
  Administered 2020-09-12: 162 mg via ORAL
  Filled 2020-09-12 (×2): qty 2

## 2020-09-12 MED ORDER — ROSUVASTATIN CALCIUM 20 MG PO TABS: 40.0000 mg | ORAL_TABLET | Freq: Every day | ORAL | Status: DC

## 2020-09-12 MED ORDER — ACETAMINOPHEN 325 MG PO TABS: 650.0000 mg | ORAL_TABLET | Freq: Four times a day (QID) | ORAL | Status: DC | PRN

## 2020-09-12 MED ORDER — PREDNISOLONE ACETATE 1 % OP SUSP
1.0000 [drp] | Freq: Two times a day (BID) | OPHTHALMIC | Status: DC
  Administered 2020-09-12: 1 [drp] via OPHTHALMIC
  Filled 2020-09-12: qty 5

## 2020-09-12 MED ORDER — PREDNISOLONE ACETATE 1 % OP SUSP: 1.0000 [drp] | Freq: Two times a day (BID) | OPHTHALMIC | Status: DC | PRN

## 2020-09-12 MED ORDER — ENOXAPARIN SODIUM 40 MG/0.4ML IJ SOSY
40.0000 mg | PREFILLED_SYRINGE | INTRAMUSCULAR | Status: DC
  Administered 2020-09-12: 40 mg via SUBCUTANEOUS
  Filled 2020-09-12: qty 0.4

## 2020-09-12 MED ORDER — ASCORBIC ACID 500 MG PO TABS
1000.0000 mg | ORAL_TABLET | Freq: Every day | ORAL | Status: DC
  Administered 2020-09-12: 1000 mg via ORAL
  Filled 2020-09-12: qty 2

## 2020-09-12 MED ORDER — ONDANSETRON HCL 4 MG/2ML IJ SOLN: 4.0000 mg | Freq: Four times a day (QID) | INTRAMUSCULAR | Status: DC | PRN

## 2020-09-12 MED ORDER — ACETAMINOPHEN 650 MG RE SUPP: 650.0000 mg | Freq: Four times a day (QID) | RECTAL | Status: DC | PRN

## 2020-09-12 MED ORDER — TAMSULOSIN HCL 0.4 MG PO CAPS: 0.4000 mg | ORAL_CAPSULE | Freq: Every day | ORAL | Status: DC

## 2020-09-12 MED ORDER — RAMIPRIL 2.5 MG PO CAPS
2.5000 mg | ORAL_CAPSULE | Freq: Every day | ORAL | Status: DC
  Administered 2020-09-12: 2.5 mg via ORAL
  Filled 2020-09-12: qty 1

## 2020-09-12 MED ORDER — ESCITALOPRAM OXALATE 10 MG PO TABS
20.0000 mg | ORAL_TABLET | Freq: Every day | ORAL | Status: DC
  Administered 2020-09-12: 20 mg via ORAL
  Filled 2020-09-12: qty 2

## 2020-09-12 MED ORDER — ONDANSETRON HCL 4 MG PO TABS: 4.0000 mg | ORAL_TABLET | Freq: Four times a day (QID) | ORAL | Status: DC | PRN

## 2020-09-12 MED ORDER — ARIPIPRAZOLE 10 MG PO TABS
10.0000 mg | ORAL_TABLET | Freq: Every day | ORAL | Status: DC
  Administered 2020-09-12: 10 mg via ORAL
  Filled 2020-09-12: qty 1

## 2020-09-12 NOTE — H&P (Signed)
History and Physical    Colin Villa [id number] DOB: 03-22-1940 DOA: 09/12/2020  PCP: Lawerance Cruel, MD  Patient coming from: Home  I have personally briefly reviewed patient's old medical records in [place]  Chief Complaint: Visual hallucinations  HPI: Colin Villa is a 80 y.o. 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  Despite being blind in both eyes for past year.  Pt had new onset visual hallucinations today.  Pt recognizes that they were hallucinations.  Per daughter, patients "wit remained sharp".  He remains oriented to person, place, situation.  No fevers, cough, SOB.  Has had gait instability today which is new.   ED Course: negative work up thus far.  EDP wants admit for obs.   Review of Systems: As per HPI, otherwise all review of systems negative.  Past Medical History:  Diagnosis Date   ADHD    Blind in both eyes    BPH (benign prostatic hyperplasia)    Carotid artery occlusion    Colonic polyp    Depression    Elevated fasting glucose    Elevated PSA    Essential hypertension, benign    Hypertension    Kidney stones    Mixed dyslipidemia    Osteoarthritis    Especially of the left kneee which end stage     Past Surgical History:  Procedure Laterality Date   COLONOSCOPY     CORONARY ANGIOGRAPHY  1998   KNEE SURGERY  1959   medial collateral ligament   LEFT HEART CATH AND CORONARY ANGIOGRAPHY N/A 08/10/2016   Procedure: LEFT HEART CATH AND CORONARY ANGIOGRAPHY;  Surgeon: Troy Sine, MD;  Location: [facility];  Service: Cardiovascular;  Laterality: N/A;   LITHOTRIPSY  [place] Left 2010   THORACIC AORTOGRAM N/A 08/10/2016   Procedure: Thoracic Aortogram;  Surgeon: Troy Sine, MD;  Location: [facility];  Service: Cardiovascular;   Laterality: N/A;     reports that he quit smoking about 24 years ago. He has never used smokeless tobacco. He reports current alcohol use. He reports that he does not use drugs.  Allergies  Allergen Reactions   Sulfa Antibiotics Other (See Comments)    Childhood allergy    Family History  Problem Relation Age of Onset   Heart failure Mother    Melanoma Mother    Mental illness Mother    Heart failure Father    Cancer Brother        bladder   Diabetes Brother      Prior to Admission medications   Medication Sig Start Date End Date Taking? Authorizing Provider  ARIPiprazole (ABILIFY) 10 MG tablet Take 10 mg by mouth daily. 09/08/20  Yes [provider]  Ascorbic Acid (VITAMIN C) 1000 MG tablet Take 1,000 mg by mouth daily.   Yes [provider]  aspirin 81 MG EC tablet Take 162 mg by mouth daily. 01/23/19  Yes [provider]  atropine 1 % ophthalmic solution Place 1 drop into both eyes at bedtime. 08/08/20  Yes [provider]  B Complex Vitamins (VITAMIN-B COMPLEX PO) Take 1 capsule by mouth daily.   Yes [provider]  escitalopram (LEXAPRO) 20 MG tablet Take 20 mg by mouth daily. 09/01/20  Yes [provider]  FERROUS SULFATE PO  Take 65 mg by mouth daily.   Yes [provider]  Omega-3 Fatty Acids (OMEGA 3 PO) Take 1 capsule by mouth daily.   Yes [provider]  prednisoLONE acetate (PRED FORTE) 1 % ophthalmic suspension Place 1 drop into both eyes See admin instructions. Morning and bedtime. May use 2 extra times during the day if needed 03/17/20  Yes [provider]  ramipril (ALTACE) 2.5 MG capsule Take 2.5 mg by mouth daily. 11/04/18  Yes [provider]  rosuvastatin (CRESTOR) 40 MG tablet Take 1 tablet (40 mg total) by mouth daily. Please make overdue appt with Dr. Marlou Porch before anymore refills. 1st attempt Patient taking differently: Take 40 mg by mouth at bedtime. 04/09/19  Yes Jerline Pain, MD  tamsulosin (FLOMAX) 0.4 MG CAPS capsule Take 0.4 mg by mouth at bedtime. 01/11/16  Yes [provider]  traZODone (DESYREL) 150 MG tablet Take 150 mg by mouth at bedtime. 07/14/20  Yes [provider]  valACYclovir (VALTREX) 1000 MG tablet Take 1,000 mg by mouth daily. 07/27/20  Yes [provider]  amoxicillin (AMOXIL) 500 MG capsule Take 1,000 mg by mouth See admin instructions. Prior to dental appointment 09/08/20   [provider]  ARIPiprazole (ABILIFY) 2 MG tablet TAKE 1 TABLET(2 MG) BY MOUTH DAILY Patient not taking: No sig reported 07/23/19   Cottle, Billey Co., MD  clonazePAM (KLONOPIN) 0.5 MG tablet Take 1 tablet (0.5 mg total) by mouth 2 (two) times daily as needed. for anxiety Patient not taking: No sig reported 11/06/18   Cottle, Billey Co., MD  PARoxetine (PAXIL) 30 MG tablet TAKE 2 TABLETS(60 MG) BY MOUTH DAILY Patient not taking: No sig reported 06/18/19   Cottle, Billey Co., MD    Physical Exam: Vitals:   09/12/20 0136 09/12/20 0245 09/12/20 0400  BP: 121/60 (!) 123/55 109/71  Pulse: 66 63 72  Resp: '18 16 16  '$ Temp: 98 F (36.7 C)    TempSrc: Oral    SpO2: 95% 96% 97%    Constitutional: NAD, calm, comfortable Eyes: Pupils not really reactive. ENMT: Mucous membranes are moist. Posterior pharynx clear of any exudate or lesions.Normal dentition.  Neck: normal, supple, no masses, no thyromegaly Respiratory: clear to auscultation bilaterally, no wheezing, no crackles. Normal respiratory effort. No accessory muscle use.  Cardiovascular: Regular rate and rhythm, no murmurs / rubs / gallops. No extremity edema. 2+ pedal pulses. No carotid bruits.  Abdomen: no tenderness, no masses palpated. No hepatosplenomegaly. Bowel sounds positive.  Musculoskeletal: no clubbing / cyanosis. No joint deformity upper and lower extremities. Good ROM, no contractures. Normal muscle tone.  Skin: no rashes, lesions, ulcers. No induration Neurologic: CN  2-12 grossly intact. Sensation intact, DTR normal. Strength 5/5 in all 4.  Psychiatric: Normal judgment and insight. Alert and oriented x 3.  Labs on Admission: I have personally reviewed following labs and imaging studies  CBC: Recent Labs  Lab 09/12/20 0225 09/12/20 0258  WBC 5.6  --   NEUTROABS 4.0  --   HGB 12.0* 10.9*  HCT 33.8* 32.0*  MCV 100.0  --   PLT 122*  --    Basic Metabolic Panel: Recent Labs  Lab 09/12/20 0225 09/12/20 0258  NA 137 139  K 4.0 4.1  CL 104 102  CO2 26  --   GLUCOSE 101* 95  BUN 21 21  CREATININE 0.98 1.10  CALCIUM 9.4  --    GFR: CrCl cannot be calculated (Unknown ideal  weight.). Liver Function Tests: Recent Labs  Lab 09/12/20 0225  AST 33  ALT 35  ALKPHOS 66  BILITOT 1.0  PROT 6.3*  ALBUMIN 3.7   No results for input(s): LIPASE, AMYLASE in the last 168 hours. No results for input(s): AMMONIA in the last 168 hours. Coagulation Profile: Recent Labs  Lab 09/12/20 0225  INR 1.1   Cardiac Enzymes: No results for input(s): CKTOTAL, CKMB, CKMBINDEX, TROPONINI in the last 168 hours. BNP (last 3 results) No results for input(s): PROBNP in the last 8760 hours. HbA1C: No results for input(s): HGBA1C in the last 72 hours. CBG: Recent Labs  Lab 09/12/20 0138  GLUCAP 92   Lipid Profile: No results for input(s): CHOL, HDL, LDLCALC, TRIG, CHOLHDL, LDLDIRECT in the last 72 hours. Thyroid Function Tests: No results for input(s): TSH, T4TOTAL, FREET4, T3FREE, THYROIDAB in the last 72 hours. Anemia Panel: No results for input(s): VITAMINB12, FOLATE, FERRITIN, TIBC, IRON, RETICCTPCT in the last 72 hours. Urine analysis:    Component Value Date/Time   COLORURINE YELLOW 09/12/2020 0405   APPEARANCEUR HAZY (A) 09/12/2020 0405   LABSPEC 1.010 09/12/2020 0405   PHURINE 5.0 09/12/2020 0405   GLUCOSEU NEGATIVE 09/12/2020 0405   HGBUR NEGATIVE 09/12/2020 0405   BILIRUBINUR NEGATIVE 09/12/2020 0405   KETONESUR NEGATIVE 09/12/2020 0405    PROTEINUR NEGATIVE 09/12/2020 0405   UROBILINOGEN 1.0 09/17/2008 1231   NITRITE NEGATIVE 09/12/2020 0405   LEUKOCYTESUR SMALL (A) 09/12/2020 0405    Radiological Exams on Admission: CT HEAD WO CONTRAST (5MM)  Result Date: 09/12/2020 CLINICAL DATA:  Neuro deficit, acute, stroke suspected. Unsteady gait, disequilibrium EXAM: CT HEAD WITHOUT CONTRAST TECHNIQUE: Contiguous axial images were obtained from the base of the skull through the vertex without intravenous contrast. COMPARISON:  MRI 01/31/2016 FINDINGS: Brain: Normal anatomic configuration. Moderate parenchymal volume loss is commensurate with the patient's age and appears stable since prior examination. Mild periventricular white matter changes are present likely reflecting the sequela of small vessel ischemia. No abnormal intra or extra-axial mass lesion or fluid collection. No abnormal mass effect or midline shift. No evidence of acute intracranial hemorrhage or infarct. Ventricular size is normal. Cerebellum unremarkable. Vascular: No asymmetric hyperdense vasculature at the skull base. Skull: Intact Sinuses/Orbits: The paranasal sinuses are clear. Right scleral banding and high density material within the ocular globe likely related to intra vitreous injection is noted. The orbits are otherwise unremarkable. Other: Mastoid air cells and middle ear cavities are clear. IMPRESSION: No acute intracranial abnormality.  Stable senescent change. Electronically Signed   By: Fidela Salisbury M.D.   On: 09/12/2020 02:54    EKG: Independently reviewed.  Assessment/Plan Principal Problem:   Visual hallucinations Active Problems:   Essential hypertension   Esophageal cancer (HCC)   Blind in both eyes    Visual hallucinations - DDx Sherran Needs Syndrome Seems quite likely actually, timing of onset from visual loss is correct. However, this wouldnt explain the gait instability though. Working up other conditions as below. Delirium due to  undiscovered medical condition - Possible, though completely oriented at the time of my evaluation.  No acute medical condition discovered thus far Negative work up thus far: UA CBC CMP CT head CXR Covid and flu EtOH Work up still pending MRI brain Ammonia CT chest Gait instability - Not clear on cause PT/OT eval MRI as above. H/o Esophageal CA - Currently in remission s/p chemo and radiation as of CT just last month CT chest today pending MRI brain pending HTN -  Cont home BP meds  DVT prophylaxis: Lovenox Code Status: Full Family Communication: Family at bedside Disposition Plan: Home after work up most likely Consults called: None Admission status: Place in [place], [place] Hospitalists  How to contact the [facility] Attending or Consulting provider [place] or covering provider during after hours [place], for this patient?  Check the care team in [facility] and look for a) attending/consulting [facility] provider listed and b) the [facility] team listed Log into [email or web address]  Amion Physician Scheduling and messaging for groups and whole hospitals  On call and physician scheduling software for group practices, residents, hospitalists and other medical providers for call, clinic, rotation and shift schedules. On[employer] is a hospital-wide system for scheduling doctors and paging doctors on call. EasyPlot is for scientific plotting and data analysis.  [email or web address]  and use [place]'s universal password to access. If you do not have the password, please contact the hospital operator.  Locate the [facility] provider you are looking for under [facility] and page to a number that you can be directly reached. If you still have difficulty reaching the provider, please page the [facility] (Director on Call) for the Hospitalists listed on amion for assistance.  09/12/2020, 4:40 AM

## 2020-09-12 NOTE — Plan of Care (Signed)

## 2020-09-12 NOTE — ED Notes (Signed)
Breakfast orders placed 

## 2020-09-12 NOTE — Evaluation (Signed)
Occupational Therapy Evaluation Patient Details Name: Colin Villa MRN: [id number] DOB: 11-30-1940 Today's Date: 09/12/2020    History of Present Illness This 80 y.o male admitted with new onset hallucinations.  Work up underway.  MRI of brain negative for acute abnormality.  PMH includes: esophageal CA with resultant radiation and chemo that caused bil blindness (1 year and 4 mos ago); CAD, depression, HTN; s/p Lt TKA   Clinical Impression   .Patient evaluated by Occupational Therapy with no further acute OT needs identified. All education has been completed and the patient has no further questions. Pt appears to be close to his baseline.  He reports he feels a bit more unsteady.  He has good family support.  See below for any follow-up Occupational Therapy or equipment needs. OT is signing off. Thank you for this referral.      Follow Up Recommendations  No OT follow up;Supervision/Assistance - 24 hour    Equipment Recommendations  None recommended by OT    Recommendations for Other Services       Precautions / Restrictions Precautions Precautions: Fall Precaution Comments: pt blind      Mobility Bed Mobility Overal bed mobility: Modified Independent                  Transfers                      Balance Overall balance assessment: Needs assistance Sitting-balance support: No upper extremity supported;Feet supported Sitting balance-Leahy Scale: Good     Standing balance support: No upper extremity supported;During functional activity Standing balance-Leahy Scale: Fair                             ADL either performed or assessed with clinical judgement   ADL Overall ADL's : At baseline                                       General ADL Comments: Pt appears to be at, or close to baseline.  He does report he feels a little less steady when ambulating.  He was able to don/doff socks and shoes including tying them with set  up assist.  He ambulated to BR with HHA to guide him through unfamiliar environment.  He performed grooming with set up     Vision Baseline Vision/History:  (Blind both eyes) Ability to See in Adequate Light: 4 Severely impaired Patient Visual Report: Other (comment) Additional Comments: Pt reports total blindness both eyes.  He does report he does have some light perception     Perception Perception Comments: Pt reports visual hallucination - 5 people with one of them who talks to him occasionallyl   Praxis Praxis Praxis tested?: Within functional limits    Pertinent Vitals/Pain Pain Assessment: No/denies pain     Hand Dominance Right   Extremity/Trunk Assessment Upper Extremity Assessment Upper Extremity Assessment: RUE deficits/detail;LUE deficits/detail RUE Deficits / Details: Rt shoulder ~90* - he reports long standing limitations due to arthritis.  He reports intermittent numbness of digits 4,5.  He does endorse sleeping with elbows bent RUE Sensation: decreased light touch LUE Deficits / Details: He reports intermittent numbness of digits 4,5.  He does endorse sleeping with elbows bent LUE Sensation: decreased light touch   Lower Extremity Assessment Lower Extremity Assessment: Defer to PT evaluation  Cervical / Trunk Assessment Cervical / Trunk Assessment: Normal   Communication Communication Communication: No difficulties   Cognition Arousal/Alertness: Awake/alert Behavior During Therapy: WFL for tasks assessed/performed Overall Cognitive Status: Within Functional Limits for tasks assessed                                     General Comments       Exercises     Shoulder Instructions      Home Living Family/patient expects to be discharged to:: Private residence Living Arrangements: Spouse/significant other;Children Available Help at Discharge: Family;Available 24 hours/day Type of Home: House Home Access: Level entry     Home Layout:  Two level;Bed/bath upstairs Alternate Level Stairs-Number of Steps: full flight   Bathroom Shower/Tub: Teacher, early years/pre: Handicapped height     Home Equipment: Other (comment) (white cane)   Additional Comments: Pt lives with wife, who assists as needed.  He reports he stays with his daughter for a few weeks periodically to give his wife a break      Prior Functioning/Environment Level of Independence: Needs assistance  Gait / Transfers Assistance Needed: pt reports he ambulates mod I using white cane.  He ambulates up the stairs, but sits on his bottom and slides down the stairs.  He denies falls ADL's / Homemaking Assistance Needed: Pt reports he performs ADLs after set up most of the time, but family assists him periodically   Comments: Pt reports he enjoys books on tape        OT Problem List: Impaired vision/perception      OT Treatment/Interventions:      OT Goals(Current goals can be found in the care plan section) Acute Rehab OT Goals Patient Stated Goal: to go home soon OT Goal Formulation: All assessment and education complete, DC therapy  OT Frequency:     Barriers to D/C:            Co-evaluation              AM-PAC OT "6 Clicks" Daily Activity     Outcome Measure Help from another person eating meals?: A Little Help from another person taking care of personal grooming?: A Little Help from another person toileting, which includes using toliet, bedpan, or urinal?: A Little Help from another person bathing (including washing, rinsing, drying)?: A Little Help from another person to put on and taking off regular upper body clothing?: A Little Help from another person to put on and taking off regular lower body clothing?: A Little 6 Click Score: 18   End of Session Nurse Communication: Mobility status  Activity Tolerance:   Patient left: Other (comment) (with PT)  OT Visit Diagnosis: Low vision, both eyes (H54.2)                 Time: [id number] OT Time Calculation (min): 26 min Charges:  OT General Charges $OT Visit: 1 Visit OT Evaluation $OT Eval Moderate Complexity: 1 Mod OT Treatments $Self Care/Home Management : 8-22 mins  Nilsa Nutting OTR/L Acute Rehabilitation Services Pager [phone] Office [phone] '  Lucille Passy M 09/12/2020, 9:28 AM

## 2020-09-12 NOTE — Discharge Summary (Signed)
Physician Discharge Summary  Colin Villa [id number] DOB: [birth year] DOA: 09/12/2020  PCP: Lawerance Cruel, MD  Admit date: 09/12/2020 Discharge date: 09/12/2020  Admitted From: home Disposition:  home  Recommendations for Outpatient Follow-up:  Follow up with PCP in 1-2 weeks  Home Health: none Equipment/Devices: none  Discharge Condition: stable  CODE STATUS: Full code Diet recommendation: regular  HPI: Per admitting MD, Colin Villa is a 80 y.o. 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 Despite being blind in both eyes for past year.  Pt had new onset visual hallucinations today.  Pt recognizes that they were hallucinations.  Per daughter, patients "wit remained sharp".  He remains oriented to person, place, situation. No fevers, cough, SOB. Has had gait instability today which is new.  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  He underwent an MRI which was negative for acute findings.  This syndrome seems quite likely given timing of onset from visual loss.  Patient also had transient gait instability that resolved after admission, he was able to get up and walk without assistance with physical therapy and they did not recommend any further PT follow-up.  His work-up was otherwise unremarkable  Active problems History of esophageal cancer-CT of the chest without evidence of recurrence but only showed response to therapy.  No evidence of metastatic disease in the chest. Essential hypertension-continue home medications Hyperlipidemia-continue home medications BPH-continue home medications  Sepsis ruled out   Discharge Instructions   Allergies as of 09/12/2020       Reactions   Sulfa  Antibiotics Other (See Comments)   Childhood allergy        Medication List     TAKE these medications    amoxicillin 500 MG capsule Commonly known as: AMOXIL Take 1,000 mg by mouth See admin instructions. Prior to dental appointment   ARIPiprazole 10 MG tablet Commonly known as: ABILIFY Take 10 mg by mouth daily.   aspirin 81 MG EC tablet Take 162 mg by mouth daily.   atropine 1 % ophthalmic solution Place 1 drop into both eyes at bedtime.   escitalopram 20 MG tablet Commonly known as: LEXAPRO Take 20 mg by mouth daily.   FERROUS SULFATE PO Take 65 mg by mouth daily.   OMEGA 3 PO Take 1 capsule by mouth daily.   prednisoLONE acetate 1 % ophthalmic suspension Commonly known as: PRED FORTE Place 1 drop into both eyes See admin instructions. Morning and bedtime. May use 2 extra times during the day if needed   ramipril 2.5 MG capsule Commonly known as: ALTACE Take 2.5 mg by mouth daily.   rosuvastatin 40 MG tablet Commonly known as: CRESTOR Take 1 tablet (40 mg total) by mouth daily. Please make overdue appt with Dr. Marlou Porch before anymore refills. 1st attempt What changed:  when to take this additional instructions   tamsulosin 0.4 MG Caps capsule Commonly known as: FLOMAX Take 0.4 mg by mouth at bedtime.   traZODone 150 MG tablet Commonly known as: DESYREL Take 150 mg by mouth at bedtime.   valACYclovir 1000 MG tablet Commonly known as: VALTREX Take 1,000 mg by mouth daily.   vitamin C 1000 MG tablet Take 1,000 mg by mouth daily.   VITAMIN-B COMPLEX PO Take 1 capsule by  mouth daily.         Consultations: None   Procedures/Studies: none  CT HEAD WO CONTRAST (5MM)  Result Date: 09/12/2020 CLINICAL DATA:  Neuro deficit, acute, stroke suspected. Unsteady gait, disequilibrium EXAM: CT HEAD WITHOUT CONTRAST TECHNIQUE: Contiguous axial images were obtained from the base of the skull through the vertex without intravenous contrast. COMPARISON:   MRI 01/31/2016 FINDINGS: Brain: Normal anatomic configuration. Moderate parenchymal volume loss is commensurate with the patient's age and appears stable since prior examination. Mild periventricular white matter changes are present likely reflecting the sequela of small vessel ischemia. No abnormal intra or extra-axial mass lesion or fluid collection. No abnormal mass effect or midline shift. No evidence of acute intracranial hemorrhage or infarct. Ventricular size is normal. Cerebellum unremarkable. Vascular: No asymmetric hyperdense vasculature at the skull base. Skull: Intact Sinuses/Orbits: The paranasal sinuses are clear. Right scleral banding and high density material within the ocular globe likely related to intra vitreous injection is noted. The orbits are otherwise unremarkable. Other: Mastoid air cells and middle ear cavities are clear. IMPRESSION: No acute intracranial abnormality.  Stable senescent change. Electronically Signed   By: Fidela Salisbury M.D.   On: 09/12/2020 02:54   CT Chest Wo Contrast  Result Date: 09/12/2020 CLINICAL DATA:  Abdominal trauma, esophageal cancer. EXAM: CT CHEST WITHOUT CONTRAST TECHNIQUE: Multidetector CT imaging of the chest was performed following the standard protocol without IV contrast. COMPARISON:  12/02/2017 CT chest, abdomen and pelvis. FINDINGS: Cardiovascular: Normal heart size. No significant pericardial effusion/thickening. Left anterior descending and right coronary atherosclerosis. Atherosclerotic thoracic aorta with dilated 4.2 cm ascending thoracic aorta. Normal caliber pulmonary arteries. Mediastinum/Nodes: No discrete thyroid nodules. No discrete esophageal mass or appreciable residual esophageal wall thickening in the mid to lower thoracic esophagus. No pathologically enlarged axillary, mediastinal or hilar lymph nodes, noting limited sensitivity for the detection of hilar adenopathy on this noncontrast study. Lungs/Pleura: No pneumothorax. No pleural  effusion. Moderate centrilobular emphysema with mild diffuse bronchial wall thickening. No acute consolidative airspace disease, lung masses or significant pulmonary nodules. A few scattered tiny calcified right pulmonary granulomas are again noted. Minimal paramediastinal patchy reticulation in both lungs, compatible with minimal postradiation change, new. Upper abdomen: Simple 1.3 cm anterior upper left renal cyst. Musculoskeletal: No aggressive appearing focal osseous lesions. No fractures. Moderate thoracic spondylosis. IMPRESSION: 1. No evidence of acute traumatic injury in the chest on this noncontrast scan. 2. No discrete esophageal mass or appreciable residual esophageal wall thickening in the mid to lower thoracic esophagus, compatible with response to therapy. No evidence of metastatic disease in the chest. 3. Minimal postradiation change in the paramediastinal lungs. 4. Moderate centrilobular emphysema with mild diffuse bronchial wall thickening, suggesting COPD. 5. Two-vessel coronary atherosclerosis. 6. Aortic Atherosclerosis (ICD10-I70.0) and Emphysema (ICD10-J43.9). Electronically Signed   By: Ilona Sorrel M.D.   On: 09/12/2020 05:19   MR BRAIN WO CONTRAST  Result Date: 09/12/2020 CLINICAL DATA:  Facial trauma. Visual hallucinations. History of esophageal cancer in remission. EXAM: MRI HEAD WITHOUT CONTRAST TECHNIQUE: Multiplanar, multiecho pulse sequences of the brain and surrounding structures were obtained without intravenous contrast. COMPARISON:  01/31/2016 FINDINGS: Brain: No acute infarction, hemorrhage, hydrocephalus, extra-axial collection or mass lesion. Mild generalized cerebral volume loss and chronic small vessel ischemic change. 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 Skull and upper cervical spine: Normal marrow signal Sinuses/Orbits: Interval bilateral intra-ocular collections, cataract resection, and oil tamponade on the right  at least where there is also a  scleral band. IMPRESSION: 1. No acute finding. 2. 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 3. Postoperative globes. Electronically Signed   By: Monte Fantasia M.D.   On: 09/12/2020 08:01   DG Chest Portable 1 View  Result Date: 09/12/2020 CLINICAL DATA:  Altered mental status EXAM: PORTABLE CHEST 1 VIEW COMPARISON:  Chest CT 12/02/2017 FINDINGS: Normal heart size and mediastinal contours. No acute infiltrate or edema. No effusion or pneumothorax. No acute osseous findings. IMPRESSION: Negative chest. Electronically Signed   By: Monte Fantasia M.D.   On: 09/12/2020 04:47     Subjective: - no chest pain, shortness of breath, no abdominal pain, nausea or vomiting.   Discharge Exam: BP (!) 115/59   Pulse 63   Temp (!) 97.5 F (36.4 C) (Oral)   Resp 20   Ht '5\' 8"'$  (1.727 m)   Wt 71.5 kg   SpO2 99%   BMI 23.97 kg/m   General: Pt is alert, awake, not in acute distress Cardiovascular: RRR, S1/S2 +, no rubs, no gallops Respiratory: CTA bilaterally, no wheezing, no rhonchi Abdominal: Soft, NT, ND, bowel sounds + Extremities: no edema, no cyanosis    The results of significant diagnostics from this hospitalization (including imaging, microbiology, ancillary and laboratory) are listed below for reference.     Microbiology: Recent Results (from the past 240 hour(s))  Resp Panel by RT-PCR (Flu A&B, Covid) Nasopharyngeal Swab     Status: None   Collection Time: 09/12/20  2:07 AM   Specimen: Nasopharyngeal Swab; Nasopharyngeal(NP) swabs in vial transport medium  Result Value Ref Range Status   SARS Coronavirus 2 by RT PCR NEGATIVE NEGATIVE Final    Comment: (NOTE) SARS-CoV-2 target nucleic acids are NOT DETECTED.  The SARS-CoV-2 RNA is generally detectable in upper respiratory specimens during the acute phase of infection. The lowest concentration of SARS-CoV-2 viral copies this assay can detect is 138 copies/mL. A negative  result does not preclude SARS-Cov-2 infection and should not be used as the sole basis for treatment or other patient management decisions. A negative result may occur with  improper specimen collection/handling, submission of specimen other than nasopharyngeal swab, presence of viral mutation(s) within the areas targeted by this assay, and inadequate number of viral copies(<138 copies/mL). A negative result must be combined with clinical observations, patient history, and epidemiological information. The expected result is Negative.  Fact Sheet for Patients:  EntrepreneurPulse.com.au  Fact Sheet for Healthcare Providers:  IncredibleEmployment.be  This test is no t yet approved or cleared by the [place] FDA and  has been authorized for detection and/or diagnosis of SARS-CoV-2 by FDA under an Emergency Use Authorization (EUA). This EUA will remain  in effect (meaning this test can be used) for the duration of the COVID-19 declaration under Section 564(b)(1) of the Act, 21 U.S.C.section 360bbb-3(b)(1), unless the authorization is terminated  or revoked sooner.       Influenza A by PCR NEGATIVE NEGATIVE Final   Influenza B by PCR NEGATIVE NEGATIVE Final    Comment: (NOTE) The Xpert Xpress SARS-CoV-2/FLU/RSV plus assay is intended as an aid in the diagnosis of influenza from Nasopharyngeal swab specimens and should not be used as a sole basis for treatment. Nasal washings and aspirates are unacceptable for Xpert Xpress SARS-CoV-2/FLU/RSV testing.  Fact Sheet for Patients: EntrepreneurPulse.com.au  Fact Sheet for Healthcare Providers: IncredibleEmployment.be  This test is not yet approved or cleared by the [place] FDA and has been authorized for detection and/or diagnosis of  SARS-CoV-2 by FDA under an Emergency Use Authorization (EUA). This EUA will remain in effect (meaning this test can be used)  for the duration of the COVID-19 declaration under Section 564(b)(1) of the Act, 21 U.S.C. section 360bbb-3(b)(1), unless the authorization is terminated or revoked.  Performed at [facility], [place] [address] [place],  [address]      Labs: Basic Metabolic Panel: Recent Labs  Lab 09/12/20 0225 09/12/20 0258  NA 137 139  K 4.0 4.1  CL 104 102  CO2 26  --   GLUCOSE 101* 95  BUN 21 21  CREATININE 0.98 1.10  CALCIUM 9.4  --    Liver Function Tests: Recent Labs  Lab 09/12/20 0225  AST 33  ALT 35  ALKPHOS 66  BILITOT 1.0  PROT 6.3*  ALBUMIN 3.7   CBC: Recent Labs  Lab 09/12/20 0225 09/12/20 0258  WBC 5.6  --   NEUTROABS 4.0  --   HGB 12.0* 10.9*  HCT 33.8* 32.0*  MCV 100.0  --   PLT 122*  --    CBG: Recent Labs  Lab 09/12/20 0138  GLUCAP 92   Hgb A1c No results for input(s): HGBA1C in the last 72 hours. Lipid Profile No results for input(s): CHOL, HDL, LDLCALC, TRIG, CHOLHDL, LDLDIRECT in the last 72 hours. Thyroid function studies No results for input(s): TSH, T4TOTAL, T3FREE, THYROIDAB in the last 72 hours.  Invalid input(s): FREET3 Urinalysis    Component Value Date/Time   COLORURINE YELLOW 09/12/2020 0405   APPEARANCEUR HAZY (A) 09/12/2020 0405   LABSPEC 1.010 09/12/2020 0405   PHURINE 5.0 09/12/2020 0405   GLUCOSEU NEGATIVE 09/12/2020 0405   HGBUR NEGATIVE 09/12/2020 0405   BILIRUBINUR NEGATIVE 09/12/2020 0405   KETONESUR NEGATIVE 09/12/2020 0405   PROTEINUR NEGATIVE 09/12/2020 0405   UROBILINOGEN 1.0 09/17/2008 1231   NITRITE NEGATIVE 09/12/2020 0405   LEUKOCYTESUR SMALL (A) 09/12/2020 0405    FURTHER DISCHARGE INSTRUCTIONS:   Get Medicines reviewed and adjusted: Please take all your medications with you for your next visit with your Primary MD   Laboratory/radiological data: Please request your Primary MD to go over all hospital tests and procedure/radiological results at the follow up, please ask your Primary MD to  get all Hospital records sent to his/her office.   In some cases, they will be blood work, cultures and biopsy results pending at the time of your discharge. Please request that your primary care M.D. goes through all the records of your hospital data and follows up on these results.   Also Note the following: If you experience worsening of your admission symptoms, develop shortness of breath, life threatening emergency, suicidal or homicidal thoughts you must seek medical attention immediately by calling 911 or calling your MD immediately  if symptoms less severe.   You must read complete instructions/literature along with all the possible adverse reactions/side effects for all the Medicines you take and that have been prescribed to you. Take any new Medicines after you have completely understood and accpet all the possible adverse reactions/side effects.    Do not drive when taking Pain medications or sleeping medications (Benzodaizepines)   Do not take more than prescribed Pain, Sleep and Anxiety Medications. It is not advisable to combine anxiety,sleep and pain medications without talking with your primary care practitioner   Special Instructions: If you have smoked or chewed Tobacco  in the last 2 yrs please stop smoking, stop any regular Alcohol  and or any Recreational drug  use.   Wear Seat belts while driving.   Please note: You were cared for by a hospitalist during your hospital stay. Once you are discharged, your primary care physician will handle any further medical issues. Please note that NO REFILLS for any discharge medications will be authorized once you are discharged, as it is imperative that you return to your primary care physician (or establish a relationship with a primary care physician if you do not have one) for your post hospital discharge needs so that they can reassess your need for medications and monitor your lab values.  Time coordinating discharge: 40  minutes  SIGNED:  Marzetta Board, MD, PhD 09/12/2020, 1:21 PM

## 2020-09-12 NOTE — Evaluation (Signed)
Physical Therapy Evaluation Patient Details Name: VERN FITHEN MRN: [id number] DOB: September 30, 1940 Today's Date: 09/12/2020   History of Present Illness  This 80 y.o male admitted with new onset hallucinations.  Work up underway.  MRI of brain negative for acute abnormality.  PMH includes: esophageal CA with resultant radiation and chemo that caused bil blindness (1 year and 4 mos ago); CAD, depression, HTN; s/p Lt TKA  Clinical Impression  Pt admitted with above diagnosis. Pt currently with functional limitations due to the deficits listed below (see PT Problem List). At the time of PT eval, pt was able to demonstrate transfers with modified independence and ambulation with occasional min assist (during turns). Pt feels once he is back in his familiar environment, he will be able to mobilize without the hesitancy that he was showing during session. He is likely near baseline of function. Pt reports adequate family support between wife and daughter. We will follow while admitted to optimize safety with functional mobility and monitor for decline in function. Pt will benefit from skilled PT to increase their independence and safety with mobility to allow discharge to the venue listed below.       Follow Up Recommendations No PT follow up;Supervision for mobility/OOB    Equipment Recommendations  None recommended by PT    Recommendations for Other Services       Precautions / Restrictions Precautions Precautions: Fall Precaution Comments: pt blind Restrictions Weight Bearing Restrictions: No      Mobility  Bed Mobility Overal bed mobility: Modified Independent                  Transfers Overall transfer level: Modified independent Equipment used: 1 person hand held assist                Ambulation/Gait Ambulation/Gait assistance: Min assist Gait Distance (Feet): 200 Feet Assistive device: 1 person hand held assist Gait Pattern/deviations: Shuffle;Narrow base of  support Gait velocity: Decreased Gait velocity interpretation: <1.31 ft/sec, indicative of household ambulator General Gait Details: Slow and guarded with occasional LOB, especially during turns. Shuffling gait pattern with narrow BOS noted. HHA provided throughout as pt does not have his white cane here in the hospital.  Stairs Stairs:  (Pt declined stair training. He reports he bumps up on his bottom at home.)          Wheelchair Mobility    Modified Rankin (Stroke Patients Only)       Balance Overall balance assessment: Needs assistance Sitting-balance support: No upper extremity supported;Feet supported Sitting balance-Leahy Scale: Good     Standing balance support: No upper extremity supported;During functional activity Standing balance-Leahy Scale: Poor Standing balance comment: At times; LOB during several turns                             Pertinent Vitals/Pain Pain Assessment: No/denies pain    Home Living Family/patient expects to be discharged to:: Private residence Living Arrangements: Spouse/significant other;Children Available Help at Discharge: Family;Available 24 hours/day Type of Home: House Home Access: Level entry     Home Layout: Two level;Bed/bath upstairs Home Equipment: Other (comment) (white cane) Additional Comments: Pt lives with wife, who assists as needed.  He reports he stays with his daughter for a few weeks periodically to give his wife a break    Prior Function Level of Independence: Needs assistance   Gait / Transfers Assistance Needed: pt reports he ambulates mod I using white  cane.  He ambulates up the stairs, but sits on his bottom and slides down the stairs.  He denies falls  ADL's / Homemaking Assistance Needed: Pt reports he performs ADLs after set up most of the time, but family assists him periodically  Comments: Pt reports he enjoys books on tape     Hand Dominance   Dominant Hand: Right    Extremity/Trunk  Assessment   Upper Extremity Assessment Upper Extremity Assessment: Defer to OT evaluation RUE Deficits / Details: Rt shoulder ~90* - he reports long standing limitations due to arthritis.  He reports intermittent numbness of digits 4,5.  He does endorse sleeping with elbows bent RUE Sensation: decreased light touch LUE Deficits / Details: He reports intermittent numbness of digits 4,5.  He does endorse sleeping with elbows bent LUE Sensation: decreased light touch    Lower Extremity Assessment Lower Extremity Assessment: Generalized weakness    Cervical / Trunk Assessment Cervical / Trunk Assessment: Other exceptions Cervical / Trunk Exceptions: Forward head posture with mild rounded shoulders  Communication   Communication: No difficulties  Cognition Arousal/Alertness: Awake/alert Behavior During Therapy: WFL for tasks assessed/performed Overall Cognitive Status: Within Functional Limits for tasks assessed                                        General Comments      Exercises     Assessment/Plan    PT Assessment Patient needs continued PT services  PT Problem List Decreased strength;Decreased activity tolerance;Decreased balance;Decreased mobility;Decreased knowledge of use of DME;Decreased safety awareness;Decreased knowledge of precautions       PT Treatment Interventions DME instruction;Gait training;Functional mobility training;Stair training;Therapeutic activities;Therapeutic exercise;Neuromuscular re-education;Patient/family education    PT Goals (Current goals can be found in the Care Plan section)  Acute Rehab PT Goals Patient Stated Goal: to go home soon PT Goal Formulation: With patient Time For Goal Achievement: 09/19/20 Potential to Achieve Goals: Good    Frequency Min 3X/week   Barriers to discharge        Co-evaluation               AM-PAC PT "6 Clicks" Mobility  Outcome Measure Help needed turning from your back to your  side while in a flat bed without using bedrails?: None Help needed moving from lying on your back to sitting on the side of a flat bed without using bedrails?: None Help needed moving to and from a bed to a chair (including a wheelchair)?: A Little Help needed standing up from a chair using your arms (e.g., wheelchair or bedside chair)?: A Little Help needed to walk in hospital room?: A Little Help needed climbing 3-5 steps with a railing? : A Little 6 Click Score: 20    End of Session   Activity Tolerance: Patient tolerated treatment well Patient left: in bed;with call bell/phone within reach;with bed alarm set Nurse Communication: Mobility status PT Visit Diagnosis: Unsteadiness on feet (R26.81);Difficulty in walking, not elsewhere classified (R26.2)    Time: [id number] PT Time Calculation (min) (ACUTE ONLY): 17 min   Charges:   PT Evaluation $PT Eval Low Complexity: 1 Low          Rolinda Roan, PT, DPT Acute Rehabilitation Services Pager: [phone] Office: [phone]   Thelma Comp 09/12/2020, 10:31 AM

## 2020-09-12 NOTE — ED Provider Notes (Signed)
[facility] EMERGENCY DEPARTMENT Provider Note   CSN: [id number] Arrival date & time: 09/12/20  0128     History No chief complaint on file.   HUBBARD CROSTON is a 80 y.o. male.  The history is provided by the patient.  Altered Mental Status Presenting symptoms comment:  Visual hallucinations and global weakness, inability to walk  Severity:  Moderate Most recent episode:  More than 2 days ago Episode history:  Single Duration: days. Timing:  Constant Progression:  Unchanged Chronicity:  New Context: not recent illness and not recent infection   Context comment:  Blindness in the last year Associated symptoms: hallucinations and weakness   Associated symptoms: no abdominal pain, no agitation, no difficulty breathing, no fever, no rash, no seizures, no slurred speech and no vomiting   Patient with blindness presents seeing white lights and people that aren't there.  He also has difficulty walking.  No f/c/r.  No changes in speech.  No chest pain no SOB.      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 09/05/2016   Essential hypertension 09/05/2016   Hyperlipidemia 09/05/2016   Exertional dyspnea    Osteoarthritis     Past Surgical History:  Procedure Laterality Date   COLONOSCOPY     CORONARY ANGIOGRAPHY  1998   KNEE SURGERY  1959   medial collateral ligament   LEFT HEART CATH AND  CORONARY ANGIOGRAPHY N/A 08/10/2016   Procedure: LEFT HEART CATH AND CORONARY ANGIOGRAPHY;  Surgeon: Troy Sine, MD;  Location: [facility];  Service: Cardiovascular;  Laterality: N/A;   LITHOTRIPSY  [place] Left 2010   THORACIC AORTOGRAM N/A 08/10/2016   Procedure: Thoracic Aortogram;  Surgeon: Troy Sine, MD;  Location: [facility];  Service: Cardiovascular;  Laterality: N/A;       Family History  Problem Relation Age of Onset   Heart failure Mother    Melanoma Mother    Mental illness Mother    Heart failure Father    Cancer Brother        bladder   Diabetes Brother     Social History   Tobacco Use   Smoking status: Former    Types: Cigarettes    Quit date: 01/27/1996    Years since quitting: 24.6   Smokeless tobacco: Never  Vaping Use   Vaping Use: Never used  Substance Use Topics   Alcohol use: Yes    Comment: 4 beers weekly   Drug use: No    Home Medications Prior to Admission medications   Medication Sig Start Date End Date Taking? Authorizing Provider  ARIPiprazole (ABILIFY) 10 MG tablet Take 10 mg by mouth daily. 09/08/20  Yes [provider]  Ascorbic Acid (VITAMIN C) 1000 MG tablet  Take 1,000 mg by mouth daily.   Yes [provider]  aspirin 81 MG EC tablet Take 162 mg by mouth daily. 01/23/19  Yes [provider]  atropine 1 % ophthalmic solution Place 1 drop into both eyes at bedtime. 08/08/20  Yes [provider]  B Complex Vitamins (VITAMIN-B COMPLEX PO) Take 1 capsule by mouth daily.   Yes [provider]  escitalopram (LEXAPRO) 20 MG tablet Take 20 mg by mouth daily. 09/01/20  Yes [provider]  FERROUS SULFATE PO Take 65 mg by mouth daily.   Yes [provider]  Omega-3 Fatty Acids (OMEGA 3 PO) Take 1 capsule by mouth daily.   Yes [provider]  prednisoLONE acetate (PRED FORTE) 1 % ophthalmic suspension Place 1 drop into both eyes See admin  instructions. Morning and bedtime. May use 2 extra times during the day if needed 03/17/20  Yes [provider]  ramipril (ALTACE) 2.5 MG capsule Take 2.5 mg by mouth daily. 11/04/18  Yes [provider]  rosuvastatin (CRESTOR) 40 MG tablet Take 1 tablet (40 mg total) by mouth daily. Please make overdue appt with Dr. Marlou Porch before anymore refills. 1st attempt Patient taking differently: Take 40 mg by mouth at bedtime. 04/09/19  Yes Jerline Pain, MD  tamsulosin (FLOMAX) 0.4 MG CAPS capsule Take 0.4 mg by mouth at bedtime. 01/11/16  Yes [provider]  traZODone (DESYREL) 150 MG tablet Take 150 mg by mouth at bedtime. 07/14/20  Yes [provider]  valACYclovir (VALTREX) 1000 MG tablet Take 1,000 mg by mouth daily. 07/27/20  Yes [provider]  amoxicillin (AMOXIL) 500 MG capsule Take 1,000 mg by mouth See admin instructions. Prior to dental appointment 09/08/20   [provider]  ARIPiprazole (ABILIFY) 2 MG tablet TAKE 1 TABLET(2 MG) BY MOUTH DAILY Patient not taking: No sig reported 07/23/19   Cottle, Billey Co., MD  clonazePAM (KLONOPIN) 0.5 MG tablet Take 1 tablet (0.5 mg total) by mouth 2 (two) times daily as needed. for anxiety Patient not taking: No sig reported 11/06/18   Cottle, Billey Co., MD  PARoxetine (PAXIL) 30 MG tablet TAKE 2 TABLETS(60 MG) BY MOUTH DAILY Patient not taking: No sig reported 06/18/19   Cottle, Billey Co., MD    Allergies    Sulfa antibiotics  Review of Systems   Review of Systems  Constitutional:  Negative for fever.  HENT:  Negative for drooling.   Eyes:  Negative for redness.  Respiratory:  Negative for wheezing.   Cardiovascular:  Negative for leg swelling.  Gastrointestinal:  Negative for abdominal pain and vomiting.  Genitourinary:  Negative for dysuria.  Musculoskeletal:  Negative for neck stiffness.  Skin:  Negative for rash.  Neurological:  Positive for weakness. Negative for seizures and facial  asymmetry.  Psychiatric/Behavioral:  Positive for hallucinations. Negative for agitation.        Visual hallucinations    Physical Exam Updated Vital Signs BP (!) 123/55   Pulse 63   Temp 98 F (36.7 C) (Oral)   Resp 16   SpO2 96%   Physical Exam Vitals and nursing note reviewed.  Constitutional:      General: He is not in acute distress.    Appearance: Normal appearance.  HENT:     Head: Normocephalic and atraumatic.     Nose: Nose normal.  Eyes:     Conjunctiva/sclera: Conjunctivae normal.     Pupils: Pupils are equal, round, and reactive  to light.  Cardiovascular:     Rate and Rhythm: Normal rate and regular rhythm.     Pulses: Normal pulses.     Heart sounds: Normal heart sounds.  Pulmonary:     Effort: Pulmonary effort is normal.     Breath sounds: Normal breath sounds.  Abdominal:     General: Abdomen is flat. Bowel sounds are normal.     Palpations: Abdomen is soft.     Tenderness: There is no abdominal tenderness. There is no guarding.  Musculoskeletal:        General: Normal range of motion.     Cervical back: Normal range of motion and neck supple.  Skin:    General: Skin is warm and dry.     Capillary Refill: Capillary refill takes less than 2 seconds.  Neurological:     Mental Status: He is alert.     Deep Tendon Reflexes: Reflexes normal.  Psychiatric:        Mood and Affect: Mood normal.        Behavior: Behavior normal.    ED Results / Procedures / Treatments   Labs (all labs ordered are listed, but only abnormal results are displayed) Results for orders placed or performed during the hospital encounter of 09/12/20  Resp Panel by RT-PCR (Flu A&B, Covid) Nasopharyngeal Swab   Specimen: Nasopharyngeal Swab; Nasopharyngeal(NP) swabs in vial transport medium  Result Value Ref Range   SARS Coronavirus 2 by RT PCR NEGATIVE NEGATIVE   Influenza A by PCR NEGATIVE NEGATIVE   Influenza B by PCR NEGATIVE NEGATIVE  Ethanol  Result Value Ref Range    Alcohol, Ethyl (B) <10 <10 mg/dL  Protime-INR  Result Value Ref Range   Prothrombin Time 14.4 11.4 - 15.2 seconds   INR 1.1 0.8 - 1.2  APTT  Result Value Ref Range   aPTT 31 24 - 36 seconds  CBC  Result Value Ref Range   WBC 5.6 4.0 - 10.5 K/uL   RBC 3.38 (L) 4.22 - 5.81 MIL/uL   Hemoglobin 12.0 (L) 13.0 - 17.0 g/dL   HCT 33.8 (L) 39.0 - 52.0 %   MCV 100.0 80.0 - 100.0 fL   MCH 35.5 (H) 26.0 - 34.0 pg   MCHC 35.5 30.0 - 36.0 g/dL   RDW 13.5 11.5 - 15.5 %   Platelets 122 (L) 150 - 400 K/uL   nRBC 0.0 0.0 - 0.2 %  Differential  Result Value Ref Range   Neutrophils Relative % 71 %   Neutro Abs 4.0 1.7 - 7.7 K/uL   Lymphocytes Relative 15 %   Lymphs Abs 0.9 0.7 - 4.0 K/uL   Monocytes Relative 8 %   Monocytes Absolute 0.4 0.1 - 1.0 K/uL   Eosinophils Relative 5 %   Eosinophils Absolute 0.3 0.0 - 0.5 K/uL   Basophils Relative 1 %   Basophils Absolute 0.0 0.0 - 0.1 K/uL   Immature Granulocytes 0 %   Abs Immature Granulocytes 0.01 0.00 - 0.07 K/uL  Comprehensive metabolic panel  Result Value Ref Range   Sodium 137 135 - 145 mmol/L   Potassium 4.0 3.5 - 5.1 mmol/L   Chloride 104 98 - 111 mmol/L   CO2 26 22 - 32 mmol/L   Glucose, Bld 101 (H) 70 - 99 mg/dL   BUN 21 8 - 23 mg/dL   Creatinine, Ser 0.98 0.61 - 1.24 mg/dL   Calcium 9.4 8.9 - 10.3 mg/dL   Total Protein 6.3 (L) 6.5 - 8.1  g/dL   Albumin 3.7 3.5 - 5.0 g/dL   AST 33 15 - 41 U/L   ALT 35 0 - 44 U/L   Alkaline Phosphatase 66 38 - 126 U/L   Total Bilirubin 1.0 0.3 - 1.2 mg/dL   GFR, Estimated >60 >60 mL/min   Anion gap 7 5 - 15  Urine rapid drug screen (hosp performed)  Result Value Ref Range   Opiates NONE DETECTED NONE DETECTED   Cocaine NONE DETECTED NONE DETECTED   Benzodiazepines NONE DETECTED NONE DETECTED   Amphetamines NONE DETECTED NONE DETECTED   Tetrahydrocannabinol NONE DETECTED NONE DETECTED   Barbiturates NONE DETECTED NONE DETECTED  Urinalysis, Routine w reflex microscopic Urine, Clean Catch   Result Value Ref Range   Color, Urine YELLOW YELLOW   APPearance HAZY (A) CLEAR   Specific Gravity, Urine 1.010 1.005 - 1.030   pH 5.0 5.0 - 8.0   Glucose, UA NEGATIVE NEGATIVE mg/dL   Hgb urine dipstick NEGATIVE NEGATIVE   Bilirubin Urine NEGATIVE NEGATIVE   Ketones, ur NEGATIVE NEGATIVE mg/dL   Protein, ur NEGATIVE NEGATIVE mg/dL   Nitrite NEGATIVE NEGATIVE   Leukocytes,Ua SMALL (A) NEGATIVE   RBC / HPF 0-5 0 - 5 RBC/hpf   WBC, UA 6-10 0 - 5 WBC/hpf   Bacteria, UA RARE (A) NONE SEEN   Mucus PRESENT   CBG monitoring, ED  Result Value Ref Range   Glucose-Capillary 92 70 - 99 mg/dL  I-stat chem 8, ED  Result Value Ref Range   Sodium 139 135 - 145 mmol/L   Potassium 4.1 3.5 - 5.1 mmol/L   Chloride 102 98 - 111 mmol/L   BUN 21 8 - 23 mg/dL   Creatinine, Ser 1.10 0.61 - 1.24 mg/dL   Glucose, Bld 95 70 - 99 mg/dL   Calcium, Ion 1.34 1.15 - 1.40 mmol/L   TCO2 27 22 - 32 mmol/L   Hemoglobin 10.9 (L) 13.0 - 17.0 g/dL   HCT 32.0 (L) 39.0 - 52.0 %   CT HEAD WO CONTRAST (5MM)  Result Date: 09/12/2020 CLINICAL DATA:  Neuro deficit, acute, stroke suspected. Unsteady gait, disequilibrium EXAM: CT HEAD WITHOUT CONTRAST TECHNIQUE: Contiguous axial images were obtained from the base of the skull through the vertex without intravenous contrast. COMPARISON:  MRI 01/31/2016 FINDINGS: Brain: Normal anatomic configuration. Moderate parenchymal volume loss is commensurate with the patient's age and appears stable since prior examination. Mild periventricular white matter changes are present likely reflecting the sequela of small vessel ischemia. No abnormal intra or extra-axial mass lesion or fluid collection. No abnormal mass effect or midline shift. No evidence of acute intracranial hemorrhage or infarct. Ventricular size is normal. Cerebellum unremarkable. Vascular: No asymmetric hyperdense vasculature at the skull base. Skull: Intact Sinuses/Orbits: The paranasal sinuses are clear. Right scleral  banding and high density material within the ocular globe likely related to intra vitreous injection is noted. The orbits are otherwise unremarkable. Other: Mastoid air cells and middle ear cavities are clear. IMPRESSION: No acute intracranial abnormality.  Stable senescent change. Electronically Signed   By: Fidela Salisbury M.D.   On: 09/12/2020 02:54   DG Chest Portable 1 View  Result Date: 09/12/2020 CLINICAL DATA:  Altered mental status EXAM: PORTABLE CHEST 1 VIEW COMPARISON:  Chest CT 12/02/2017 FINDINGS: Normal heart size and mediastinal contours. No acute infiltrate or edema. No effusion or pneumothorax. No acute osseous findings. IMPRESSION: Negative chest. Electronically Signed   By: Monte Fantasia M.D.   On: 09/12/2020 04:47  EKG EKG Interpretation  Date/Time:  Monday September 12 2020 02:46:34 EDT Ventricular Rate:  61 PR Interval:  218 QRS Duration: 83 QT Interval:  407 QTC Calculation: 410 R Axis:   73 Text Interpretation: Sinus rhythm Borderline prolonged PR interval Confirmed by Dory Horn) on 09/12/2020 3:48:59 AM  Radiology CT HEAD WO CONTRAST (5MM)  Result Date: 09/12/2020 CLINICAL DATA:  Neuro deficit, acute, stroke suspected. Unsteady gait, disequilibrium EXAM: CT HEAD WITHOUT CONTRAST TECHNIQUE: Contiguous axial images were obtained from the base of the skull through the vertex without intravenous contrast. COMPARISON:  MRI 01/31/2016 FINDINGS: Brain: Normal anatomic configuration. Moderate parenchymal volume loss is commensurate with the patient's age and appears stable since prior examination. Mild periventricular white matter changes are present likely reflecting the sequela of small vessel ischemia. No abnormal intra or extra-axial mass lesion or fluid collection. No abnormal mass effect or midline shift. No evidence of acute intracranial hemorrhage or infarct. Ventricular size is normal. Cerebellum unremarkable. Vascular: No asymmetric hyperdense vasculature at  the skull base. Skull: Intact Sinuses/Orbits: The paranasal sinuses are clear. Right scleral banding and high density material within the ocular globe likely related to intra vitreous injection is noted. The orbits are otherwise unremarkable. Other: Mastoid air cells and middle ear cavities are clear. IMPRESSION: No acute intracranial abnormality.  Stable senescent change. Electronically Signed   By: Fidela Salisbury M.D.   On: 09/12/2020 02:54    Procedures Procedures   Medications Ordered in ED Medications - No data to display  ED Course  I have reviewed the triage vital signs and the nursing notes.  Pertinent labs & imaging results that were available during my care of the patient were reviewed by me and considered in my medical decision making (see chart for details).    TALIQ CHILCOAT was evaluated in Emergency Department on 09/12/2020 for the symptoms described in the history of present illness. He was evaluated in the context of the global COVID-19 pandemic, which necessitated consideration that the patient might be at risk for infection with the SARS-CoV-2 virus that causes COVID-19. Institutional protocols and algorithms that pertain to the evaluation of patients at risk for COVID-19 are in a state of rapid change based on information released by regulatory bodies including the CDC and federal and state organizations. These policies and algorithms were followed during the patient's care in the ED.  Final Clinical Impression(s) / ED Diagnoses Final diagnoses:  None   Admit to medicine  Rx / DC Orders ED Discharge Orders     None        Krissia Schreier, MD 09/12/20 [phone]

## 2020-09-12 NOTE — ED Triage Notes (Addendum)
Pt from home with family  Pt is blind in both eyes, started having visions with unsteady gait tonight. Uses a cane for vision loss but noted to be off balance with family. No neuro deficits during triage

## 2020-09-12 NOTE — Progress Notes (Signed)
[name]  D/C'd Home per MD order.  Discussed with the patient and all questions fully answered.  VSS, Skin clean, dry and intact without evidence of skin break down, no evidence of skin tears noted. IV catheter discontinued intact. Site without signs and symptoms of complications. Dressing and pressure applied.  An After Visit Summary was printed and given to the patient. Patient received prescription.  D/c education completed with patient/family including follow up instructions, medication list, d/c activities limitations if indicated, with other d/c instructions as indicated by MD - patient able to verbalize understanding, all questions fully answered.   Patient instructed to return to ED, call 911, or call MD for any changes in condition.   Patient escorted via [place], and D/C home via private auto at 1730.  Dorris Carnes 09/12/2020 5:53 PM

## 2020-10-05 DIAGNOSIS — Z23 Encounter for immunization: Secondary | ICD-10-CM | POA: Diagnosis not present

## 2020-10-13 DIAGNOSIS — Z23 Encounter for immunization: Secondary | ICD-10-CM | POA: Diagnosis not present

## 2020-10-27 DIAGNOSIS — H3523 Other non-diabetic proliferative retinopathy, bilateral: Secondary | ICD-10-CM | POA: Diagnosis not present

## 2020-10-27 DIAGNOSIS — H4313 Vitreous hemorrhage, bilateral: Secondary | ICD-10-CM | POA: Diagnosis not present

## 2020-10-27 DIAGNOSIS — H548 Legal blindness, as defined in USA: Secondary | ICD-10-CM | POA: Diagnosis not present

## 2020-10-27 DIAGNOSIS — H5316 Psychophysical visual disturbances: Secondary | ICD-10-CM | POA: Diagnosis not present

## 2020-10-27 DIAGNOSIS — G4724 Circadian rhythm sleep disorder, free running type: Secondary | ICD-10-CM | POA: Diagnosis not present

## 2020-10-27 DIAGNOSIS — H33023 Retinal detachment with multiple breaks, bilateral: Secondary | ICD-10-CM | POA: Diagnosis not present

## 2020-10-27 DIAGNOSIS — H44113 Panuveitis, bilateral: Secondary | ICD-10-CM | POA: Diagnosis not present

## 2020-10-27 DIAGNOSIS — H30133 Disseminated chorioretinal inflammation, generalized, bilateral: Secondary | ICD-10-CM | POA: Diagnosis not present

## 2020-11-14 DIAGNOSIS — M1990 Unspecified osteoarthritis, unspecified site: Secondary | ICD-10-CM | POA: Diagnosis not present

## 2020-11-14 DIAGNOSIS — I10 Essential (primary) hypertension: Secondary | ICD-10-CM | POA: Diagnosis not present

## 2020-11-14 DIAGNOSIS — N401 Enlarged prostate with lower urinary tract symptoms: Secondary | ICD-10-CM | POA: Diagnosis not present

## 2020-11-14 DIAGNOSIS — F329 Major depressive disorder, single episode, unspecified: Secondary | ICD-10-CM | POA: Diagnosis not present

## 2020-11-14 DIAGNOSIS — I2510 Atherosclerotic heart disease of native coronary artery without angina pectoris: Secondary | ICD-10-CM | POA: Diagnosis not present

## 2020-11-14 DIAGNOSIS — I4891 Unspecified atrial fibrillation: Secondary | ICD-10-CM | POA: Diagnosis not present

## 2020-11-14 DIAGNOSIS — E782 Mixed hyperlipidemia: Secondary | ICD-10-CM | POA: Diagnosis not present

## 2020-11-14 DIAGNOSIS — D500 Iron deficiency anemia secondary to blood loss (chronic): Secondary | ICD-10-CM | POA: Diagnosis not present

## 2020-11-14 DIAGNOSIS — G4700 Insomnia, unspecified: Secondary | ICD-10-CM | POA: Diagnosis not present

## 2021-01-13 DIAGNOSIS — G4709 Other insomnia: Secondary | ICD-10-CM | POA: Diagnosis not present

## 2021-01-13 DIAGNOSIS — I10 Essential (primary) hypertension: Secondary | ICD-10-CM | POA: Diagnosis not present

## 2021-01-13 DIAGNOSIS — Z9221 Personal history of antineoplastic chemotherapy: Secondary | ICD-10-CM | POA: Diagnosis not present

## 2021-01-13 DIAGNOSIS — R441 Visual hallucinations: Secondary | ICD-10-CM | POA: Diagnosis not present

## 2021-01-13 DIAGNOSIS — H543 Unqualified visual loss, both eyes: Secondary | ICD-10-CM | POA: Diagnosis not present

## 2021-01-13 DIAGNOSIS — R2689 Other abnormalities of gait and mobility: Secondary | ICD-10-CM | POA: Diagnosis not present

## 2021-01-13 DIAGNOSIS — Z79899 Other long term (current) drug therapy: Secondary | ICD-10-CM | POA: Diagnosis not present

## 2021-01-13 DIAGNOSIS — Z8619 Personal history of other infectious and parasitic diseases: Secondary | ICD-10-CM | POA: Diagnosis not present

## 2021-01-13 DIAGNOSIS — I2510 Atherosclerotic heart disease of native coronary artery without angina pectoris: Secondary | ICD-10-CM | POA: Diagnosis not present

## 2021-01-13 DIAGNOSIS — R4189 Other symptoms and signs involving cognitive functions and awareness: Secondary | ICD-10-CM | POA: Diagnosis not present

## 2021-01-13 DIAGNOSIS — E782 Mixed hyperlipidemia: Secondary | ICD-10-CM | POA: Diagnosis not present

## 2021-01-13 DIAGNOSIS — I4891 Unspecified atrial fibrillation: Secondary | ICD-10-CM | POA: Diagnosis not present

## 2021-01-13 DIAGNOSIS — Z87891 Personal history of nicotine dependence: Secondary | ICD-10-CM | POA: Diagnosis not present

## 2021-01-13 DIAGNOSIS — F331 Major depressive disorder, recurrent, moderate: Secondary | ICD-10-CM | POA: Diagnosis not present

## 2021-01-13 DIAGNOSIS — G4724 Circadian rhythm sleep disorder, free running type: Secondary | ICD-10-CM | POA: Diagnosis not present

## 2021-01-13 DIAGNOSIS — R413 Other amnesia: Secondary | ICD-10-CM | POA: Diagnosis not present

## 2021-01-13 DIAGNOSIS — C159 Malignant neoplasm of esophagus, unspecified: Secondary | ICD-10-CM | POA: Diagnosis not present

## 2021-01-13 DIAGNOSIS — F411 Generalized anxiety disorder: Secondary | ICD-10-CM | POA: Diagnosis not present

## 2021-01-13 DIAGNOSIS — N400 Enlarged prostate without lower urinary tract symptoms: Secondary | ICD-10-CM | POA: Diagnosis not present

## 2021-01-16 ENCOUNTER — Other Ambulatory Visit: Payer: Self-pay

## 2021-01-16 DIAGNOSIS — I779 Disorder of arteries and arterioles, unspecified: Secondary | ICD-10-CM

## 2021-01-30 DIAGNOSIS — Z0000 Encounter for general adult medical examination without abnormal findings: Secondary | ICD-10-CM | POA: Diagnosis not present

## 2021-01-31 DIAGNOSIS — H547 Unspecified visual loss: Secondary | ICD-10-CM | POA: Diagnosis not present

## 2021-01-31 DIAGNOSIS — I10 Essential (primary) hypertension: Secondary | ICD-10-CM | POA: Diagnosis not present

## 2021-01-31 DIAGNOSIS — N401 Enlarged prostate with lower urinary tract symptoms: Secondary | ICD-10-CM | POA: Diagnosis not present

## 2021-01-31 DIAGNOSIS — F329 Major depressive disorder, single episode, unspecified: Secondary | ICD-10-CM | POA: Diagnosis not present

## 2021-01-31 DIAGNOSIS — E782 Mixed hyperlipidemia: Secondary | ICD-10-CM | POA: Diagnosis not present

## 2021-01-31 DIAGNOSIS — G4700 Insomnia, unspecified: Secondary | ICD-10-CM | POA: Diagnosis not present

## 2021-01-31 DIAGNOSIS — M6281 Muscle weakness (generalized): Secondary | ICD-10-CM | POA: Diagnosis not present

## 2021-02-01 ENCOUNTER — Ambulatory Visit (HOSPITAL_COMMUNITY)
Admission: RE | Admit: 2021-02-01 | Discharge: 2021-02-01 | Disposition: A | Discharge status: Home / Self Care | Payer: Medicare Other | Source: Ambulatory Visit | Attending: Vascular Surgery | Admitting: Vascular Surgery

## 2021-02-01 ENCOUNTER — Other Ambulatory Visit: Payer: Self-pay

## 2021-02-01 ENCOUNTER — Ambulatory Visit (INDEPENDENT_AMBULATORY_CARE_PROVIDER_SITE_OTHER): Payer: Medicare Other | Admitting: Physician Assistant

## 2021-02-01 VITALS — BP 122/66 | HR 75 | Temp 96.1°F | Ht 68.0 in | Wt 161.3 lb

## 2021-02-01 DIAGNOSIS — I779 Disorder of arteries and arterioles, unspecified: Secondary | ICD-10-CM

## 2021-02-01 NOTE — Progress Notes (Signed)
Office Note     CC:  follow up Requesting Provider:  Lawerance Cruel, MD  HPI: Colin Villa is a 81 y.o. (07-16-1940) male who presents for surveillance of carotid artery stenosis.  He has a known right internal carotid artery occlusion.  He denies any diagnosis of CVA or TIA.  He also denies any strokelike symptoms including slurring speech or changes in vision.  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  He takes a aspirin and statin daily.  He denies any chest pain or excessive shortness of breath with exertion.  Of note he is also undergoing radiation therapy for esophageal cancer   Past Medical History:  Diagnosis Date   ADHD    Blind in both eyes    BPH (benign prostatic hyperplasia)    Carotid artery occlusion    Colonic polyp    Depression    Elevated fasting glucose    Elevated PSA    Essential hypertension, benign    Hypertension    Kidney stones    Mixed dyslipidemia    Osteoarthritis    Especially of the left kneee which end stage     Past Surgical History:  Procedure Laterality Date   COLONOSCOPY     CORONARY ANGIOGRAPHY  1998   KNEE SURGERY  1959   medial collateral ligament   LEFT HEART CATH AND CORONARY ANGIOGRAPHY N/A 08/10/2016   Procedure: LEFT HEART CATH AND CORONARY ANGIOGRAPHY;  Surgeon: Troy Sine, MD;  Location: [place] [facility];  Service: Cardiovascular;  Laterality: N/A;   LITHOTRIPSY  [place] Left 2010   THORACIC AORTOGRAM N/A 08/10/2016   Procedure: Thoracic Aortogram;  Surgeon: Troy Sine, MD;  Location: [place];  Service: Cardiovascular;  Laterality: N/A;    Social History   Socioeconomic History   Marital status: Married    Spouse name: [name]   Number of children: 2   Years of education: 13   Highest education  level: Not on file  Occupational History    Comment: retired  Tobacco Use   Smoking status: Former    Types: Cigarettes    Quit date: 01/27/1996    Years since quitting: 25.0   Smokeless tobacco: Never  Vaping Use   Vaping Use: Never used  Substance and Sexual Activity   Alcohol use: Yes    Comment: 4 beers weekly   Drug use: No   Sexual activity: Not on file  Other Topics Concern   Not on file  Social History Narrative   Not on file   Social Determinants of Health   Financial Resource Strain: Not on file  Food Insecurity: Not on file  Transportation Needs: Not on file  Physical Activity: Not on file  Stress: Not on file  Social Connections: Not on file  Intimate Partner Violence: Not on file    Family History  Problem Relation Age of Onset   Heart failure Mother    Melanoma Mother    Mental illness Mother    Heart failure Father    Cancer Brother        bladder   Diabetes Brother     Current Outpatient Medications  Medication Sig Dispense Refill   ARIPiprazole (ABILIFY) 10 MG tablet Take 10 mg by mouth daily.  Ascorbic Acid (VITAMIN C) 1000 MG tablet Take 1,000 mg by mouth daily.     aspirin 81 MG EC tablet Take 162 mg by mouth daily.     atropine 1 % ophthalmic solution Place 1 drop into both eyes at bedtime.     B Complex Vitamins (VITAMIN-B COMPLEX PO) Take 1 capsule by mouth daily.     escitalopram (LEXAPRO) 20 MG tablet Take 20 mg by mouth daily.     FERROUS SULFATE PO Take 65 mg by mouth daily.     prednisoLONE acetate (PRED FORTE) 1 % ophthalmic suspension Place 1 drop into both eyes See admin instructions. Morning and bedtime. May use 2 extra times during the day if needed     ramipril (ALTACE) 2.5 MG capsule Take 2.5 mg by mouth daily.     rosuvastatin (CRESTOR) 40 MG tablet Take 1 tablet (40 mg total) by mouth daily. Please make overdue appt with Dr. Marlou Porch before anymore refills. 1st attempt (Patient taking differently: Take 40 mg by mouth at  bedtime.) 30 tablet 0   tamsulosin (FLOMAX) 0.4 MG CAPS capsule Take 0.4 mg by mouth at bedtime.  11   traZODone (DESYREL) 150 MG tablet Take 150 mg by mouth at bedtime.     valACYclovir (VALTREX) 1000 MG tablet Take 1,000 mg by mouth daily.     amoxicillin (AMOXIL) 500 MG capsule Take 1,000 mg by mouth See admin instructions. Prior to dental appointment (Patient not taking: Reported on 02/01/2021)     No current facility-administered medications for this visit.    Allergies  Allergen Reactions   Sulfa Antibiotics Other (See Comments)    Childhood allergy     REVIEW OF SYSTEMS:   [X]  denotes positive finding, [ ]  denotes negative finding Cardiac  Comments:  Chest pain or chest pressure:    Shortness of breath upon exertion:    Short of breath when lying flat:    Irregular heart rhythm:        Vascular    Pain in calf, thigh, or hip brought on by ambulation:    Pain in feet at night that wakes you up from your sleep:     Blood clot in your veins:    Leg swelling:         Pulmonary    Oxygen at home:    Productive cough:     Wheezing:         Neurologic    Sudden weakness in arms or legs:     Sudden numbness in arms or legs:     Sudden onset of difficulty speaking or slurred speech:    Temporary loss of vision in one eye:     Problems with dizziness:         Gastrointestinal    Blood in stool:     Vomited blood:         Genitourinary    Burning when urinating:     Blood in urine:        Psychiatric    Major depression:         Hematologic    Bleeding problems:    Problems with blood clotting too easily:        Skin    Rashes or ulcers:        Constitutional    Fever or chills:      PHYSICAL EXAMINATION:  Vitals:   02/01/21 1335 02/01/21 1339  BP: 130/66 122/66  Pulse: 76 75  Temp: (!)  96.1 F (35.6 C)   Weight: 161 lb 4.8 oz (73.2 kg)   Height: 5\' 8"  (1.727 m)     General:  WDWN in NAD; vital signs documented above Gait: Not observed HENT:  WNL, normocephalic Pulmonary: normal non-labored breathing , without Rales, rhonchi,  wheezing Cardiac: regular HR Abdomen: soft, NT, no masses Skin: without rashes Vascular Exam/Pulses:  Right Left  Radial 2+ (normal) 2+ (normal)   Extremities: without ischemic changes, without Gangrene , without cellulitis; without open wounds;  Musculoskeletal: no muscle wasting or atrophy  Neurologic: A&O X 3;  CN grossly intact aside from limitations in exam related to blindness Psychiatric:  The pt has Normal affect.   Non-Invasive Vascular Imaging:   Right ICA occluded Left ICA with peak systolic velocity of 503 cm/s and end-diastolic velocity of 546 cm/s    ASSESSMENT/PLAN:: 81 y.o. male here for surveillance of carotid artery stenosis  -Subjectively without any strokelike symptoms -Carotid duplex demonstrates borderline critical stenosis of left internal carotid artery; right ICA is known to be occluded -Case was discussed with Dr. Donzetta Matters.  Plan will be to obtain CTA head and neck in the next couple weeks and follow-up with Dr. Donzetta Matters to discuss management of left ICA.  Patient is aware he may require surgery.  Patient's daughter is also present who agrees to proceed with further work-up.  They also know to seek immediate medical attention if they detect signs or symptoms of a stroke.   Dagoberto Ligas, PA-C [facility] [phone]  Clinic MD:   Donzetta Matters

## 2021-02-06 DIAGNOSIS — D485 Neoplasm of uncertain behavior of skin: Secondary | ICD-10-CM | POA: Diagnosis not present

## 2021-02-06 DIAGNOSIS — L853 Xerosis cutis: Secondary | ICD-10-CM | POA: Diagnosis not present

## 2021-02-06 DIAGNOSIS — L821 Other seborrheic keratosis: Secondary | ICD-10-CM | POA: Diagnosis not present

## 2021-02-06 DIAGNOSIS — C44622 Squamous cell carcinoma of skin of right upper limb, including shoulder: Secondary | ICD-10-CM | POA: Diagnosis not present

## 2021-02-06 DIAGNOSIS — L570 Actinic keratosis: Secondary | ICD-10-CM | POA: Diagnosis not present

## 2021-02-06 DIAGNOSIS — L820 Inflamed seborrheic keratosis: Secondary | ICD-10-CM | POA: Diagnosis not present

## 2021-02-06 DIAGNOSIS — Z85828 Personal history of other malignant neoplasm of skin: Secondary | ICD-10-CM | POA: Diagnosis not present

## 2021-02-06 DIAGNOSIS — D225 Melanocytic nevi of trunk: Secondary | ICD-10-CM | POA: Diagnosis not present

## 2021-02-06 DIAGNOSIS — L578 Other skin changes due to chronic exposure to nonionizing radiation: Secondary | ICD-10-CM | POA: Diagnosis not present

## 2021-02-06 DIAGNOSIS — L814 Other melanin hyperpigmentation: Secondary | ICD-10-CM | POA: Diagnosis not present

## 2021-02-06 DIAGNOSIS — Z23 Encounter for immunization: Secondary | ICD-10-CM | POA: Diagnosis not present

## 2021-02-10 DIAGNOSIS — R911 Solitary pulmonary nodule: Secondary | ICD-10-CM | POA: Diagnosis not present

## 2021-02-10 DIAGNOSIS — Z923 Personal history of irradiation: Secondary | ICD-10-CM | POA: Diagnosis not present

## 2021-02-10 DIAGNOSIS — C155 Malignant neoplasm of lower third of esophagus: Secondary | ICD-10-CM | POA: Diagnosis not present

## 2021-02-10 DIAGNOSIS — C159 Malignant neoplasm of esophagus, unspecified: Secondary | ICD-10-CM | POA: Diagnosis not present

## 2021-02-10 DIAGNOSIS — Z8501 Personal history of malignant neoplasm of esophagus: Secondary | ICD-10-CM | POA: Diagnosis not present

## 2021-02-10 DIAGNOSIS — C154 Malignant neoplasm of middle third of esophagus: Secondary | ICD-10-CM | POA: Diagnosis not present

## 2021-02-10 DIAGNOSIS — Z9221 Personal history of antineoplastic chemotherapy: Secondary | ICD-10-CM | POA: Diagnosis not present

## 2021-02-10 DIAGNOSIS — Z08 Encounter for follow-up examination after completed treatment for malignant neoplasm: Secondary | ICD-10-CM | POA: Diagnosis not present

## 2021-02-16 DIAGNOSIS — F411 Generalized anxiety disorder: Secondary | ICD-10-CM | POA: Diagnosis not present

## 2021-02-16 DIAGNOSIS — F332 Major depressive disorder, recurrent severe without psychotic features: Secondary | ICD-10-CM | POA: Diagnosis not present

## 2021-02-21 ENCOUNTER — Ambulatory Visit
Admission: RE | Admit: 2021-02-21 | Discharge: 2021-02-21 | Disposition: A | Discharge status: Home / Self Care | Payer: Medicare Other | Source: Ambulatory Visit | Attending: Vascular Surgery | Admitting: Vascular Surgery

## 2021-02-21 DIAGNOSIS — J439 Emphysema, unspecified: Secondary | ICD-10-CM | POA: Diagnosis not present

## 2021-02-21 DIAGNOSIS — I6523 Occlusion and stenosis of bilateral carotid arteries: Secondary | ICD-10-CM | POA: Diagnosis not present

## 2021-02-21 DIAGNOSIS — I6503 Occlusion and stenosis of bilateral vertebral arteries: Secondary | ICD-10-CM | POA: Diagnosis not present

## 2021-02-21 DIAGNOSIS — I779 Disorder of arteries and arterioles, unspecified: Secondary | ICD-10-CM

## 2021-02-21 DIAGNOSIS — I700 Atherosclerosis of aorta: Secondary | ICD-10-CM | POA: Diagnosis not present

## 2021-02-21 MED ORDER — IOPAMIDOL (ISOVUE-370) INJECTION 76%
75.0000 mL | Freq: Once | INTRAVENOUS | Status: AC | PRN
  Administered 2021-02-21: 75 mL via INTRAVENOUS

## 2021-02-22 ENCOUNTER — Other Ambulatory Visit: Payer: Self-pay

## 2021-02-22 ENCOUNTER — Encounter: Payer: Self-pay | Admitting: Vascular Surgery

## 2021-02-22 ENCOUNTER — Ambulatory Visit (INDEPENDENT_AMBULATORY_CARE_PROVIDER_SITE_OTHER): Payer: Medicare Other | Admitting: Vascular Surgery

## 2021-02-22 VITALS — BP 111/69 | HR 69 | Temp 98.2°F | Resp 20 | Ht 68.0 in | Wt 161.0 lb

## 2021-02-22 DIAGNOSIS — I779 Disorder of arteries and arterioles, unspecified: Secondary | ICD-10-CM

## 2021-02-22 MED ORDER — CLOPIDOGREL BISULFATE 75 MG PO TABS: 75.0000 mg | ORAL_TABLET | Freq: Every day | ORAL | 6 refills | Status: DC

## 2021-02-22 NOTE — Progress Notes (Signed)
Patient ID: Colin Villa, male   DOB: Oct 03, 1940, 81 y.o.   MRN: [id number]  Reason for Consult: Follow-up   Referred by Lawerance Cruel, MD  Subjective:     HPI:  Colin Villa is a 81 y.o. 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  He does not have any history of stroke, TIA or amaurosis.  Patient is blind as a complication of chemotherapy.  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  He does not have any previous surgeries on his neck has undergone radiation therapy for esophageal cancer for which she has completed.  He is on aspirin he also takes a statin he is not on blood thinners or Plavix.  He is here today with CTA to evaluate carotid stenosis on the left.  Past Medical History:  Diagnosis Date   ADHD    Blind in both eyes    BPH (benign prostatic hyperplasia)    Carotid artery occlusion    Colonic polyp    Depression    Elevated fasting glucose    Elevated PSA    Essential hypertension, benign    Hypertension    Kidney stones    Mixed dyslipidemia    Osteoarthritis    Especially of the left kneee which end stage    Family History  Problem Relation Age of Onset   Heart failure Mother    Melanoma Mother    Mental illness Mother    Heart failure Father    Cancer Brother        bladder   Diabetes Brother    Past Surgical History:  Procedure Laterality Date   COLONOSCOPY     CORONARY ANGIOGRAPHY  1998   KNEE SURGERY  1959   medial collateral ligament   LEFT HEART CATH AND CORONARY ANGIOGRAPHY N/A 08/10/2016   Procedure: LEFT HEART CATH AND CORONARY ANGIOGRAPHY;  Surgeon: Troy Sine, MD;  Location: [place] [facility];  Service: Cardiovascular;  Laterality: N/A;   LITHOTRIPSY  [place] Left 2010   THORACIC AORTOGRAM N/A 08/10/2016   Procedure: Thoracic Aortogram;  Surgeon: Troy Sine, MD;  Location: [place] [facility];  Service:  Cardiovascular;  Laterality: N/A;    Short Social History:  Social History   Tobacco Use   Smoking status: Former    Types: Cigarettes    Quit date: 01/27/1996    Years since quitting: 25.0   Smokeless tobacco: Never  Substance Use Topics   Alcohol use: Yes    Comment: 4 beers weekly    Allergies  Allergen Reactions   Sulfa Antibiotics Other (See Comments)    Childhood allergy    Current Outpatient Medications  Medication Sig Dispense Refill   ARIPiprazole (ABILIFY) 10 MG tablet Take 10 mg by mouth daily.     Ascorbic Acid (VITAMIN C) 1000 MG tablet Take 1,000 mg by mouth daily.     aspirin 81 MG EC tablet Take 162 mg by mouth daily.     atropine 1 % ophthalmic solution Place 1 drop into both eyes at bedtime.     B Complex Vitamins (VITAMIN-B COMPLEX PO) Take 1 capsule by mouth daily.     escitalopram (LEXAPRO) 20 MG tablet Take 20 mg by mouth daily.     FERROUS SULFATE PO Take 65 mg by mouth daily.     prednisoLONE acetate (PRED FORTE) 1 % ophthalmic suspension Place  1 drop into both eyes See admin instructions. Morning and bedtime. May use 2 extra times during the day if needed     ramipril (ALTACE) 2.5 MG capsule Take 2.5 mg by mouth daily.     rosuvastatin (CRESTOR) 40 MG tablet Take 1 tablet (40 mg total) by mouth daily. Please make overdue appt with Dr. Marlou Porch before anymore refills. 1st attempt (Patient taking differently: Take 40 mg by mouth at bedtime.) 30 tablet 0   tamsulosin (FLOMAX) 0.4 MG CAPS capsule Take 0.4 mg by mouth at bedtime.  11   traZODone (DESYREL) 150 MG tablet Take 150 mg by mouth at bedtime.     valACYclovir (VALTREX) 1000 MG tablet Take 1,000 mg by mouth daily.     amoxicillin (AMOXIL) 500 MG capsule Take 1,000 mg by mouth See admin instructions. Prior to dental appointment (Patient not taking: Reported on 02/22/2021)     No current facility-administered medications for this visit.    Review of Systems  Constitutional:  Constitutional  negative. HENT: HENT negative.  Eyes: Eyes negative.  Respiratory: Respiratory negative.  Cardiovascular: Cardiovascular negative.  GI: Gastrointestinal negative.  Musculoskeletal: Musculoskeletal negative.  Skin: Skin negative.  Neurological: Neurological negative. Hematologic: Hematologic/lymphatic negative.  Psychiatric: Psychiatric negative.       Objective:  Objective   Vitals:   02/22/21 1436  BP: 111/69  Pulse: 69  Resp: 20  Temp: 98.2 F (36.8 C)  SpO2: 96%  Weight: 161 lb (73 kg)  Height: 5\' 8"  (1.727 m)   Body mass index is 24.48 kg/m.  Physical Exam HENT:     Head: Normocephalic.     Nose:     Comments: Wearing a mask Eyes:     Pupils: Pupils are equal, round, and reactive to light.  Cardiovascular:     Rate and Rhythm: Normal rate.     Pulses:          Radial pulses are 2+ on the right side and 2+ on the left side.  Pulmonary:     Effort: Pulmonary effort is normal.  Abdominal:     General: Abdomen is flat.  Musculoskeletal:        General: Normal range of motion.     Cervical back: Neck supple.     Right lower leg: No edema.     Left lower leg: No edema.  Skin:    General: Skin is warm and dry.     Capillary Refill: Capillary refill takes less than 2 seconds.  Neurological:     General: No focal deficit present.     Mental Status: He is alert.    Data: CT IMPRESSION: 1. Occlusion of the right internal carotid artery just distal to its origin without distal reconstitution. 2. Approximately 70% stenosis of the proximal left internal carotid artery due to mixed density plaque. 3. 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     Assessment/Plan:    81 year old male with high-grade left ICA stenosis with known occlusion of his right ICA.  I reviewed his CT scan together with his daughter today and I discussed the options being carotid endarterectomy on the left versus transcarotid artery stenting versus medical therapy.  At this time  we have settled on adding Plavix to his medication regimen.  He will follow-up in 1 year with repeat carotid duplex.  Patient and his family are well aware that carotid duplex findings are somewhat skewed by the right-sided occlusion.  At this time patient would prefer not to  have any procedures unless absolutely necessary which is certainly not the case with a 70% stenosis of the left ICA.  We discussed the signs and symptoms of stroke and they demonstrate good understanding.    Waynetta Sandy MD [facility]

## 2021-02-23 DIAGNOSIS — E782 Mixed hyperlipidemia: Secondary | ICD-10-CM | POA: Diagnosis not present

## 2021-02-23 DIAGNOSIS — I10 Essential (primary) hypertension: Secondary | ICD-10-CM | POA: Diagnosis not present

## 2021-03-13 ENCOUNTER — Encounter: Payer: Self-pay | Admitting: Vascular Surgery

## 2021-03-14 ENCOUNTER — Telehealth: Payer: Self-pay

## 2021-03-14 NOTE — Telephone Encounter (Signed)
Pt's daughter sent mychart message with several in depth questions about pt's surgical options. He saw MD last month and surgery was mentioned as an option, but at the time pt and his daughter did not want to discuss surgery. They now are open to this discussion and were offered a virtual appt but prefer in person. Pt has been scheduled with MD. ?

## 2021-03-15 ENCOUNTER — Encounter: Payer: Self-pay | Admitting: Vascular Surgery

## 2021-03-15 ENCOUNTER — Ambulatory Visit (INDEPENDENT_AMBULATORY_CARE_PROVIDER_SITE_OTHER): Payer: Medicare Other | Admitting: Vascular Surgery

## 2021-03-15 ENCOUNTER — Other Ambulatory Visit: Payer: Self-pay

## 2021-03-15 VITALS — BP 112/71 | HR 70 | Temp 98.1°F | Resp 20 | Ht 68.0 in | Wt 161.0 lb

## 2021-03-15 DIAGNOSIS — I779 Disorder of arteries and arterioles, unspecified: Secondary | ICD-10-CM | POA: Diagnosis not present

## 2021-03-15 NOTE — Progress Notes (Signed)
? ?Patient ID: Colin Villa, male   DOB: 19-Sep-1940, 81 y.o.   MRN: [id number] ? ?Reason for Consult: Follow-up ?  ?Referred by Lawerance Cruel, MD ? ?Subjective:  ?   ?HPI: ? ?Colin Villa is a 81 y.o. male whom I have previously seen for high-grade left ICA stenosis with known occlusion on the right.  This remains asymptomatic.  Patient has blindness in both eyes not related to his carotid ultrasounds.  He denies any history of stroke, TIA or amaurosis.  At last discussion we added Plavix to aspirin and statin for maximal medical therapy.  Patient and his daughter are now back to discuss possible surgical intervention. ? ?Past Medical History:  ?Diagnosis Date  ? ADHD   ? Blind in both eyes   ? BPH (benign prostatic hyperplasia)   ? Carotid artery occlusion   ? Colonic polyp   ? Depression   ? Elevated fasting glucose   ? Elevated PSA   ? Essential hypertension, benign   ? Hypertension   ? Kidney stones   ? Mixed dyslipidemia   ? Osteoarthritis   ? Especially of the left kneee which end stage   ? ?Family History  ?Problem Relation Age of Onset  ? Heart failure Mother   ? Melanoma Mother   ? Mental illness Mother   ? Heart failure Father   ? Cancer Brother   ?     bladder  ? Diabetes Brother   ? ?Past Surgical History:  ?Procedure Laterality Date  ? COLONOSCOPY    ? CORONARY ANGIOGRAPHY  1998  ? [place]  ? medial collateral ligament  ? LEFT HEART CATH AND CORONARY ANGIOGRAPHY N/A 08/10/2016  ? Procedure: LEFT HEART CATH AND CORONARY ANGIOGRAPHY;  Surgeon: Troy Sine, MD;  Location: [place] [facility];  Service: Cardiovascular;  Laterality: N/A;  ? LITHOTRIPSY  1997  ? REPLACEMENT TOTAL KNEE Left 2010  ? THORACIC AORTOGRAM N/A 08/10/2016  ? Procedure: Thoracic Aortogram;  Surgeon: Troy Sine, MD;  Location: [place] [facility];  Service: Cardiovascular;  Laterality: N/A;  ? ? ?Short Social History:  ?Social History  ? ?Tobacco Use  ? Smoking status: Former  ?  Types: Cigarettes  ?  Quit date:  01/27/1996  ?  Years since quitting: 25.1  ? Smokeless tobacco: Never  ?Substance Use Topics  ? Alcohol use: Yes  ?  Comment: 4 beers weekly  ? ? ?Allergies  ?Allergen Reactions  ? Dextromethorphan-Quinidine   ?  Other reaction(s): Dizziness (intolerance)  ? Sulfa Antibiotics Other (See Comments)  ?  Childhood allergy  ? ? ?Current Outpatient Medications  ?Medication Sig Dispense Refill  ? ARIPiprazole (ABILIFY) 10 MG tablet Take 10 mg by mouth daily.    ? Ascorbic Acid (VITAMIN C) 1000 MG tablet Take 1,000 mg by mouth daily.    ? aspirin 81 MG EC tablet Take 162 mg by mouth daily.    ? atropine 1 % ophthalmic solution Place 1 drop into both eyes at bedtime.    ? B Complex Vitamins (VITAMIN-B COMPLEX PO) Take 1 capsule by mouth daily.    ? clopidogrel (PLAVIX) 75 MG tablet Take 1 tablet (75 mg total) by mouth daily. 30 tablet 6  ? escitalopram (LEXAPRO) 20 MG tablet Take 20 mg by mouth daily.    ? FERROUS SULFATE PO Take 65 mg by mouth daily.    ? lamoTRIgine (LAMICTAL) 100 MG tablet     ?  prednisoLONE acetate (PRED FORTE) 1 % ophthalmic suspension Place 1 drop into both eyes See admin instructions. Morning and bedtime. May use 2 extra times during the day if needed    ? ramipril (ALTACE) 2.5 MG capsule Take 2.5 mg by mouth daily.    ? rosuvastatin (CRESTOR) 40 MG tablet Take 1 tablet (40 mg total) by mouth daily. Please make overdue appt with Dr. Marlou Porch before anymore refills. 1st attempt (Patient taking differently: Take 40 mg by mouth at bedtime.) 30 tablet 0  ? tamsulosin (FLOMAX) 0.4 MG CAPS capsule Take 0.4 mg by mouth at bedtime.  11  ? traZODone (DESYREL) 150 MG tablet Take 150 mg by mouth at bedtime.    ? valACYclovir (VALTREX) 1000 MG tablet Take 1,000 mg by mouth daily.    ? ?No current facility-administered medications for this visit.  ? ? ?Review of Systems  ?Constitutional:  Constitutional negative. ?HENT: HENT negative.  ?Eyes: Positive for loss of vision.   ?Cardiovascular: Cardiovascular negative.   ?GI: Gastrointestinal negative.  ?Musculoskeletal: Musculoskeletal negative.  ?Skin: Skin negative.  ?Neurological: Neurological negative. ?Hematologic: Hematologic/lymphatic negative.  ?Psychiatric: Psychiatric negative.   ? ?   ?Objective:  ?Objective  ? ?Vitals:  ? 03/15/21 1119  ?BP: 112/71  ?Pulse: 70  ?Resp: 20  ?Temp: 98.1 ?F (36.7 ?C)  ?SpO2: 96%  ?Weight: 161 lb (73 kg)  ?Height: '5\' 8"'$  (1.727 m)  ? ?Body mass index is 24.48 kg/m?. ? ?Physical Exam ?HENT:  ?   Head: Normocephalic.  ?   Nose:  ?   Comments: Wearing a mask ?Eyes:  ?   Pupils: Pupils are equal, round, and reactive to light.  ?Cardiovascular:  ?   Rate and Rhythm: Normal rate.  ?Pulmonary:  ?   Effort: Pulmonary effort is normal.  ?Abdominal:  ?   General: Abdomen is flat.  ?   Palpations: Abdomen is soft.  ?Musculoskeletal:     ?   General: Normal range of motion.  ?   Cervical back: Normal range of motion and neck supple.  ?   Right lower leg: No edema.  ?   Left lower leg: No edema.  ?Skin: ?   General: Skin is warm and dry.  ?   Capillary Refill: Capillary refill takes less than 2 seconds.  ?Neurological:  ?   General: No focal deficit present.  ?   Mental Status: He is alert.  ?Psychiatric:     ?   Mood and Affect: Mood normal.     ?   Behavior: Behavior normal.  ? ? ?Data: ?We again reviewed his CT angio which demonstrates 70% left ICA stenosis which is mixed soft and calcified plaque. ? ?    ?Assessment/Plan:  ?  ?81 year old male with asymptomatic 70% left ICA stenosis by angio.  Duplex is somewhat skewed due to contralateral occlusion.  I discussed with the patient and his daughter again continuing medical therapy versus transcarotid artery stent versus carotid endarterectomy.  At this time they want to consider transcarotid artery stenting and we discussed the procedural details and the need for Plavix at least 4 to 6 weeks afterwards.  Patient does have a skin procedure scheduled for the near future we will get this performed first  and then consider transcarotid artery stenting on the left.  He will need to be on Plavix and aspirin and statin throughout the periprocedural time. ? ?  ? ?Waynetta Sandy MD ?[facility] ? ? ?

## 2021-03-20 DIAGNOSIS — I10 Essential (primary) hypertension: Secondary | ICD-10-CM | POA: Diagnosis not present

## 2021-03-20 DIAGNOSIS — E782 Mixed hyperlipidemia: Secondary | ICD-10-CM | POA: Diagnosis not present

## 2021-03-20 DIAGNOSIS — F329 Major depressive disorder, single episode, unspecified: Secondary | ICD-10-CM | POA: Diagnosis not present

## 2021-03-30 DIAGNOSIS — F331 Major depressive disorder, recurrent, moderate: Secondary | ICD-10-CM | POA: Diagnosis not present

## 2021-03-30 DIAGNOSIS — F411 Generalized anxiety disorder: Secondary | ICD-10-CM | POA: Diagnosis not present

## 2021-04-20 DIAGNOSIS — L905 Scar conditions and fibrosis of skin: Secondary | ICD-10-CM | POA: Diagnosis not present

## 2021-04-20 DIAGNOSIS — C44622 Squamous cell carcinoma of skin of right upper limb, including shoulder: Secondary | ICD-10-CM | POA: Diagnosis not present

## 2021-04-27 DIAGNOSIS — H30133 Disseminated chorioretinal inflammation, generalized, bilateral: Secondary | ICD-10-CM | POA: Diagnosis not present

## 2021-04-27 DIAGNOSIS — H35053 Retinal neovascularization, unspecified, bilateral: Secondary | ICD-10-CM | POA: Diagnosis not present

## 2021-04-27 DIAGNOSIS — H548 Legal blindness, as defined in USA: Secondary | ICD-10-CM | POA: Diagnosis not present

## 2021-04-27 DIAGNOSIS — H44113 Panuveitis, bilateral: Secondary | ICD-10-CM | POA: Diagnosis not present

## 2021-04-27 DIAGNOSIS — H4313 Vitreous hemorrhage, bilateral: Secondary | ICD-10-CM | POA: Diagnosis not present

## 2021-04-27 DIAGNOSIS — G4724 Circadian rhythm sleep disorder, free running type: Secondary | ICD-10-CM | POA: Diagnosis not present

## 2021-04-27 DIAGNOSIS — H3523 Other non-diabetic proliferative retinopathy, bilateral: Secondary | ICD-10-CM | POA: Diagnosis not present

## 2021-04-27 DIAGNOSIS — H5316 Psychophysical visual disturbances: Secondary | ICD-10-CM | POA: Diagnosis not present

## 2021-04-27 DIAGNOSIS — H35353 Cystoid macular degeneration, bilateral: Secondary | ICD-10-CM | POA: Diagnosis not present

## 2021-04-27 DIAGNOSIS — H33023 Retinal detachment with multiple breaks, bilateral: Secondary | ICD-10-CM | POA: Diagnosis not present

## 2021-05-05 DIAGNOSIS — C44629 Squamous cell carcinoma of skin of left upper limb, including shoulder: Secondary | ICD-10-CM | POA: Diagnosis not present

## 2021-05-05 DIAGNOSIS — L570 Actinic keratosis: Secondary | ICD-10-CM | POA: Diagnosis not present

## 2021-05-05 DIAGNOSIS — D485 Neoplasm of uncertain behavior of skin: Secondary | ICD-10-CM | POA: Diagnosis not present

## 2021-05-27 DIAGNOSIS — B078 Other viral warts: Secondary | ICD-10-CM | POA: Diagnosis not present

## 2021-06-22 DIAGNOSIS — C44629 Squamous cell carcinoma of skin of left upper limb, including shoulder: Secondary | ICD-10-CM | POA: Diagnosis not present

## 2021-06-22 DIAGNOSIS — C44529 Squamous cell carcinoma of skin of other part of trunk: Secondary | ICD-10-CM | POA: Diagnosis not present

## 2021-06-22 DIAGNOSIS — D485 Neoplasm of uncertain behavior of skin: Secondary | ICD-10-CM | POA: Diagnosis not present

## 2021-07-06 DIAGNOSIS — F411 Generalized anxiety disorder: Secondary | ICD-10-CM | POA: Diagnosis not present

## 2021-07-06 DIAGNOSIS — F330 Major depressive disorder, recurrent, mild: Secondary | ICD-10-CM | POA: Diagnosis not present

## 2021-08-04 DIAGNOSIS — I872 Venous insufficiency (chronic) (peripheral): Secondary | ICD-10-CM | POA: Diagnosis not present

## 2021-08-04 DIAGNOSIS — L03031 Cellulitis of right toe: Secondary | ICD-10-CM | POA: Diagnosis not present

## 2021-08-04 DIAGNOSIS — C44622 Squamous cell carcinoma of skin of right upper limb, including shoulder: Secondary | ICD-10-CM | POA: Diagnosis not present

## 2021-08-04 DIAGNOSIS — L570 Actinic keratosis: Secondary | ICD-10-CM | POA: Diagnosis not present

## 2021-08-04 DIAGNOSIS — D485 Neoplasm of uncertain behavior of skin: Secondary | ICD-10-CM | POA: Diagnosis not present

## 2021-08-14 DIAGNOSIS — C779 Secondary and unspecified malignant neoplasm of lymph node, unspecified: Secondary | ICD-10-CM | POA: Diagnosis not present

## 2021-08-14 DIAGNOSIS — Z87891 Personal history of nicotine dependence: Secondary | ICD-10-CM | POA: Diagnosis not present

## 2021-08-14 DIAGNOSIS — C154 Malignant neoplasm of middle third of esophagus: Secondary | ICD-10-CM | POA: Diagnosis not present

## 2021-08-17 DIAGNOSIS — C44529 Squamous cell carcinoma of skin of other part of trunk: Secondary | ICD-10-CM | POA: Diagnosis not present

## 2021-08-17 DIAGNOSIS — L905 Scar conditions and fibrosis of skin: Secondary | ICD-10-CM | POA: Diagnosis not present

## 2021-08-31 DIAGNOSIS — F330 Major depressive disorder, recurrent, mild: Secondary | ICD-10-CM | POA: Diagnosis not present

## 2021-08-31 DIAGNOSIS — F411 Generalized anxiety disorder: Secondary | ICD-10-CM | POA: Diagnosis not present

## 2021-09-04 ENCOUNTER — Other Ambulatory Visit: Payer: Self-pay | Admitting: Vascular Surgery

## 2021-09-29 ENCOUNTER — Encounter: Payer: Self-pay | Admitting: Vascular Surgery

## 2021-10-02 DIAGNOSIS — Z23 Encounter for immunization: Secondary | ICD-10-CM | POA: Diagnosis not present

## 2021-10-03 DIAGNOSIS — N401 Enlarged prostate with lower urinary tract symptoms: Secondary | ICD-10-CM | POA: Diagnosis not present

## 2021-10-03 DIAGNOSIS — I10 Essential (primary) hypertension: Secondary | ICD-10-CM | POA: Diagnosis not present

## 2021-10-03 DIAGNOSIS — F329 Major depressive disorder, single episode, unspecified: Secondary | ICD-10-CM | POA: Diagnosis not present

## 2021-10-03 DIAGNOSIS — E782 Mixed hyperlipidemia: Secondary | ICD-10-CM | POA: Diagnosis not present

## 2021-10-05 ENCOUNTER — Other Ambulatory Visit: Payer: Self-pay

## 2021-10-05 DIAGNOSIS — C44622 Squamous cell carcinoma of skin of right upper limb, including shoulder: Secondary | ICD-10-CM | POA: Diagnosis not present

## 2021-10-05 DIAGNOSIS — I779 Disorder of arteries and arterioles, unspecified: Secondary | ICD-10-CM

## 2021-10-10 ENCOUNTER — Ambulatory Visit (HOSPITAL_COMMUNITY)
Admission: RE | Admit: 2021-10-10 | Discharge: 2021-10-10 | Disposition: A | Discharge status: Home / Self Care | Payer: Medicare Other | Source: Ambulatory Visit | Attending: Vascular Surgery | Admitting: Vascular Surgery

## 2021-10-10 DIAGNOSIS — I779 Disorder of arteries and arterioles, unspecified: Secondary | ICD-10-CM | POA: Diagnosis not present

## 2021-10-11 ENCOUNTER — Encounter: Payer: Self-pay | Admitting: Vascular Surgery

## 2021-10-11 ENCOUNTER — Ambulatory Visit (INDEPENDENT_AMBULATORY_CARE_PROVIDER_SITE_OTHER): Payer: Medicare Other | Admitting: Vascular Surgery

## 2021-10-11 VITALS — BP 107/65 | HR 72 | Temp 98.3°F | Resp 20 | Ht 68.0 in | Wt 161.0 lb

## 2021-10-11 DIAGNOSIS — I6522 Occlusion and stenosis of left carotid artery: Secondary | ICD-10-CM | POA: Diagnosis not present

## 2021-10-11 NOTE — Progress Notes (Signed)
Patient ID: Colin Villa, male   DOB: Jan 24, 1940, 81 y.o.   MRN: [id number]  Reason for Consult: Follow-up   Referred by Colin Cruel, MD  Subjective:     HPI:  Colin Villa is a 81 y.o. male with a history of high-grade left ICA stenosis with occluded right ICA.  He is asymptomatic from the left ICA stenosis.  Does not have a history of any symptoms of stroke, TIA or amaurosis.  He is on aspirin Plavix and a statin.  He does have a known 70% stenosis of the left ICA in the past we have discussed stenting for which they present for further discussion today.  He has had some issues with memory but otherwise in his usual state of health.  Patient is blind and is accompanied by his daughter and SIL today.  Past Medical History:  Diagnosis Date   ADHD    Blind in both eyes    BPH (benign prostatic hyperplasia)    Carotid artery occlusion    Colonic polyp    Depression    Elevated fasting glucose    Elevated PSA    Essential hypertension, benign    Hypertension    Kidney stones    Mixed dyslipidemia    Osteoarthritis    Especially of the left kneee which end stage    Family History  Problem Relation Age of Onset   Heart failure Mother    Melanoma Mother    Mental illness Mother    Heart failure Father    Cancer Brother        bladder   Diabetes Brother    Past Surgical History:  Procedure Laterality Date   COLONOSCOPY     CORONARY ANGIOGRAPHY  1998   KNEE SURGERY  1959   medial collateral ligament   LEFT HEART CATH AND CORONARY ANGIOGRAPHY N/A 08/10/2016   Procedure: LEFT HEART CATH AND CORONARY ANGIOGRAPHY;  Surgeon: Colin Sine, MD;  Location: [place] [facility];  Service: Cardiovascular;  Laterality: N/A;   LITHOTRIPSY  [place] Left 2010   THORACIC AORTOGRAM N/A 08/10/2016   Procedure: Thoracic Aortogram;  Surgeon: Colin Sine, MD;  Location: [place] [facility];  Service: Cardiovascular;  Laterality: N/A;    Short Social  History:  Social History   Tobacco Use   Smoking status: Former    Types: Cigarettes    Quit date: 01/27/1996    Years since quitting: 25.7   Smokeless tobacco: Never  Substance Use Topics   Alcohol use: Yes    Comment: 4 beers weekly    Allergies  Allergen Reactions   Dextromethorphan-Quinidine     Other reaction(s): Dizziness (intolerance)   Sulfa Antibiotics Other (See Comments)    Childhood allergy    Current Outpatient Medications  Medication Sig Dispense Refill   ARIPiprazole (ABILIFY) 10 MG tablet Take 10 mg by mouth daily.     Ascorbic Acid (VITAMIN C) 1000 MG tablet Take 1,000 mg by mouth daily.     aspirin 81 MG EC tablet Take 162 mg by mouth daily.     atropine 1 % ophthalmic solution Place 1 drop into both eyes at bedtime.     B Complex Vitamins (VITAMIN-B COMPLEX PO) Take 1 capsule by mouth daily.     clopidogrel (PLAVIX) 75 MG tablet TAKE 1 TABLET(75 MG) BY MOUTH DAILY 30 tablet 6   escitalopram (LEXAPRO) 20 MG tablet Take 20 mg by mouth daily.  FERROUS SULFATE PO Take 65 mg by mouth daily.     lamoTRIgine (LAMICTAL) 100 MG tablet      prednisoLONE acetate (PRED FORTE) 1 % ophthalmic suspension Place 1 drop into both eyes See admin instructions. Morning and bedtime. May use 2 extra times during the day if needed     ramipril (ALTACE) 2.5 MG capsule Take 2.5 mg by mouth daily.     rosuvastatin (CRESTOR) 40 MG tablet Take 1 tablet (40 mg total) by mouth daily. Please make overdue appt with Dr. Marlou Villa before anymore refills. 1st attempt (Patient taking differently: Take 40 mg by mouth at bedtime.) 30 tablet 0   tamsulosin (FLOMAX) 0.4 MG CAPS capsule Take 0.4 mg by mouth at bedtime.  11   traZODone (DESYREL) 150 MG tablet Take 150 mg by mouth at bedtime.     valACYclovir (VALTREX) 1000 MG tablet Take 1,000 mg by mouth daily.     No current facility-administered medications for this visit.    Review of Systems  Constitutional:  Constitutional negative. HENT:  HENT negative.  Eyes: Positive for loss of vision.   Respiratory: Respiratory negative.  Cardiovascular: Cardiovascular negative.  GI: Gastrointestinal negative.  Musculoskeletal: Musculoskeletal negative.  Skin: Skin negative.  Neurological: Neurological negative. Hematologic: Hematologic/lymphatic negative.  Psychiatric: Psychiatric negative.        Objective:  Objective   Vitals:   10/11/21 1419  BP: 107/65  Pulse: 72  Resp: 20  Temp: 98.3 F (36.8 C)  SpO2: 95%    Physical Exam HENT:     Head: Normocephalic.     Nose: Nose normal.  Eyes:     Pupils: Pupils are equal, round, and reactive to light.  Cardiovascular:     Pulses: Normal pulses.  Pulmonary:     Effort: Pulmonary effort is normal.  Abdominal:     General: Abdomen is flat.     Palpations: Abdomen is soft.  Musculoskeletal:        General: Normal range of motion.     Cervical back: Normal range of motion and neck supple.     Right lower leg: No edema.     Left lower leg: No edema.  Skin:    General: Skin is warm.     Capillary Refill: Capillary refill takes less than 2 seconds.  Neurological:     General: No focal deficit present.     Mental Status: He is alert.  Psychiatric:        Mood and Affect: Mood normal.     Data: Right Carotid Findings:  +----------+--------+--------+--------+---------------------+--------------  ----+            PSV cm/sEDV cm/sStenosisPlaque Description   Comments              +----------+--------+--------+--------+---------------------+--------------  ----+  CCA Prox  69                                           High  resistant                                                             flow                  +----------+--------+--------+--------+---------------------+--------------  ----+  CCA Mid   66      10                                   High  resistant                                                              flow                  +----------+--------+--------+--------+---------------------+--------------  ----+  CCA Distal59                      diffuse,             High  resistant                                        heterogenous,        flow                                                    calcific and                                                                 irregular                                  +----------+--------+--------+--------+---------------------+--------------  ----+  ICA Prox  33              Occludedheterogenous,        High  resistant                                        irregular and        flow                                                    calcific                                   +----------+--------+--------+--------+---------------------+--------------  ----+  ICA Mid   0               Occludedhypoechoic                                 +----------+--------+--------+--------+---------------------+--------------  ----+  ICA Distal0  Occludedhypoechoic                                 +----------+--------+--------+--------+---------------------+--------------  ----+  ECA       95                                                                 +----------+--------+--------+--------+---------------------+--------------  ----+   +----------+--------+-------+---------+-------------------+            PSV cm/sEDV cmsDescribe Arm Pressure (mmHG)  +----------+--------+-------+---------+-------------------+  [id number]             Turbulent                     +----------+--------+-------+---------+-------------------+   +---------+--------+--+--------+--+---------+  VertebralPSV cm/s66EDV cm/s23Antegrade  +---------+--------+--+--------+--+---------+       Left Carotid Findings:   +----------+--------+--------+--------+-------------------------------+----  ----+            PSV cm/sEDV cm/sStenosisPlaque Description              Comments  +----------+--------+--------+--------+-------------------------------+----  ----+  CCA Prox  99      29                                                         +----------+--------+--------+--------+-------------------------------+----  ----+  CCA Mid   96      23                                                         +----------+--------+--------+--------+-------------------------------+----  ----+  CCA Distal88      24                                                         +----------+--------+--------+--------+-------------------------------+----  ----+  ICA Prox  105     36              heterogenous and irregular                 +----------+--------+--------+--------+-------------------------------+----  ----+  ICA Mid   293     97      60-79%  hypoechoic, irregular,                                                       heterogenous and calcific                  +----------+--------+--------+--------+-------------------------------+----  ----+  ICA [id number]     121     80-99%  diffuse, heterogenous and  irregular                                  +----------+--------+--------+--------+-------------------------------+----  ----+  ECA       139     13                                                         +----------+--------+--------+--------+-------------------------------+----  ----+   +----------+--------+--------+---------+-------------------+            PSV cm/sEDV cm/sDescribe Arm Pressure (mmHG)  +----------+--------+--------+---------+-------------------+  Subclavian185             Turbulent                     +----------+--------+--------+---------+-------------------+    +---------+--------+--+--------+--+---------+  VertebralPSV cm/s27EDV cm/s12Antegrade  +---------+--------+--+--------+--+---------+           Summary:  Right Carotid: Evidence consistent with a total occlusion of the right  ICA.   Left Carotid: Velocities in the left ICA are consistent with a 80-99%  stenosis.   Vertebrals:  Bilateral vertebral arteries demonstrate antegrade flow.  Subclavians: Left subclavian artery flow was disturbed. Normal flow  hemodynamics               were seen in the right subclavian artery.      Assessment/Plan:    81 year old male with asymptomatic left ICA stenosis.  He is on aspirin, Plavix and statin.  We discussed proceeding with left transcarotid artery stenting which he demonstrates good understanding of the risk and benefits in the presence of his daughter and son-in-law we will get this scheduled after cardiac clearance which is scheduled October 25 with Dr. [place].     Waynetta Sandy MD [facility]

## 2021-10-25 DIAGNOSIS — Z23 Encounter for immunization: Secondary | ICD-10-CM | POA: Diagnosis not present

## 2021-10-29 NOTE — Progress Notes (Signed)
Cardiology Office Note:    Date:  11/01/2021   ID:  Colin Villa, DOB Jan 02, 1941, MRN [id number]  PCP:  Daisy Floro, MD   [facility] Providers Cardiologist:  Donato Schultz, MD     Referring MD: Vicki Mallet*   Chief Complaint  Patient presents with   New Patient (Initial Visit)   carotid stenosis    History of Present Illness:    Colin Villa is a 81 y.o. male who is seen at the request of Dr Randie Heinz for CV risk assessment. He has a history of HTN, severe arthritis, blindness, Carotid arterial disease and glucose intolerance. He has been seen by Dr Randie Heinz with severe left carotid stenosis and is being considered for carotid stenting. Previously was evaluated by cardiology - Dr Anne Fu. Last seen in 2019. Previously there was a nuclear stress test that showed normal perfusion but inferolateral ST segment depression on exercise treadmill test.  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  Medical therapy recommended.  He was diagnosed with esophageal CA in 2019. Was treated with RT and chemo at [facility]. Had recurrence in 2021 with repeat treatment. Apparently developed blindness during this treatment related to zoster infection. Also apparently had some pericarditis with initial therapy. Holter at [facility] in 2021 was benign. Echo in 2020 was normal. He had F/u CT which suggested carotid stenosis leading to recent evaluation by Dr Randie Heinz. Chest CT did show extensive coronary calcification.   He denies any chest pain, dyspnea, palpitations, edema. He does walk and participates in PT.   Past Medical History:  Diagnosis Date   ADHD    Blind in both eyes    BPH (benign prostatic hyperplasia)    Carotid artery occlusion    Colonic polyp    Depression    Elevated fasting glucose    Elevated PSA    Essential hypertension, benign    Hypertension    Kidney stones    Mixed  dyslipidemia    Osteoarthritis    Especially of the left kneee which end stage     Past Surgical History:  Procedure Laterality Date   COLONOSCOPY     CORONARY ANGIOGRAPHY  1998   KNEE SURGERY  1959   medial collateral ligament   LEFT HEART CATH AND CORONARY ANGIOGRAPHY N/A 08/10/2016   Procedure: LEFT HEART CATH AND CORONARY ANGIOGRAPHY;  Surgeon: Lennette Bihari, MD;  Location: [facility] INVASIVE CV LAB;  Service: Cardiovascular;  Laterality: N/A;   LITHOTRIPSY  1997   REPLACEMENT TOTAL KNEE Left 2010   THORACIC AORTOGRAM N/A 08/10/2016   Procedure: Thoracic Aortogram;  Surgeon: Lennette Bihari, MD;  Location: [facility] INVASIVE CV LAB;  Service: Cardiovascular;  Laterality: N/A;    Current Medications: Current Meds  Medication Sig   Ascorbic Acid (VITAMIN C) 1000 MG tablet Take 1,000 mg by mouth daily.   aspirin 81 MG EC tablet Take 162 mg by mouth daily.   atropine 1 % ophthalmic solution Place 1 drop into both eyes at bedtime.   B Complex Vitamins (VITAMIN-B COMPLEX PO) Take 1 capsule by mouth daily.   clopidogrel (PLAVIX) 75 MG tablet TAKE 1 TABLET(75 MG) BY MOUTH DAILY   escitalopram (LEXAPRO) 20 MG tablet Take 20 mg by mouth daily.   FERROUS SULFATE PO Take 65 mg by mouth daily.   lamoTRIgine (LAMICTAL) 100 MG tablet    lamoTRIgine (LAMICTAL) 25 MG tablet Take 25 mg  by mouth daily.   prednisoLONE acetate (PRED FORTE) 1 % ophthalmic suspension Place 1 drop into both eyes See admin instructions. Morning and bedtime. May use 2 extra times during the day if needed   ramipril (ALTACE) 2.5 MG capsule Take 2.5 mg by mouth daily.   rosuvastatin (CRESTOR) 40 MG tablet Take 1 tablet (40 mg total) by mouth daily. Please make overdue appt with Dr. Anne Fu before anymore refills. 1st attempt (Patient taking differently: Take 40 mg by mouth at bedtime.)   traZODone (DESYREL) 150 MG tablet Take 150 mg by mouth at bedtime.   valACYclovir (VALTREX) 1000 MG tablet Take 1,000 mg by mouth daily.      Allergies:   Dextromethorphan-quinidine and Sulfa antibiotics   Social History   Socioeconomic History   Marital status: Married    Spouse name: Joyce Gross   Number of children: 2   Years of education: 13   Highest education level: Not on file  Occupational History    Comment: retired  Tobacco Use   Smoking status: Former    Types: Cigarettes    Quit date: 01/27/1996    Years since quitting: 25.7   Smokeless tobacco: Never  Vaping Use   Vaping Use: Never used  Substance and Sexual Activity   Alcohol use: Yes    Comment: 4 beers weekly   Drug use: No   Sexual activity: Not on file  Other Topics Concern   Not on file  Social History Narrative   Not on file   Social Determinants of Health   Financial Resource Strain: Not on file  Food Insecurity: Not on file  Transportation Needs: Not on file  Physical Activity: Not on file  Stress: Not on file  Social Connections: Not on file     Family History: The patient's family history includes Cancer in his brother; Diabetes in his brother; Heart failure in his father and mother; Melanoma in his mother; Mental illness in his mother.  ROS:   Please see the history of present illness.     All other systems reviewed and are negative.  EKGs/Labs/Other Studies Reviewed:    The following studies were reviewed today: Echo 08/07/16: Study Conclusions   - Left ventricle: The cavity size was normal. Wall thickness was    normal. Systolic function was normal. The estimated ejection    fraction was in the range of 55% to 60%. Wall motion was normal;    there were no regional wall motion abnormalities. Doppler    parameters are consistent with abnormal left ventricular    relaxation (grade 1 diastolic dysfunction). Doppler parameters    are consistent with high ventricular filling pressure.  - Aortic valve: There was mild regurgitation.  - Aortic root: The aortic root was mildly dilated.  - Mitral valve: Calcified annulus.  - Atrial  septum: There was an atrial septal aneurysm.   Impressions:   - Normal LV systolic function; mild diastolic dysfunction; mildly    thickened aortic valve with mild AI; mildly dilated aortic root    (4.2 cm).   Myoview 08/07/21: Study Highlights    Nuclear stress EF: 68%. Blood pressure demonstrated a normal response to exercise. Horizontal ST segment depression ST segment depression of 2 mm was noted during stress in the aVF, V6, V5, V4, III and II leads, beginning at 6 minutes of stress, and returning to baseline after 1-5 minutes of recovery. The study is normal. This is a low risk study.   Low risk stress nuclear  study with normal perfusion and normal left ventricular regional and global systolic function. The ECG stress test is markedly abnormal. This could represent a "false positive" ECG response, but possible "balanced ischemia" (e.g. stenotic dominant left coronary artery) should be considered.     LEFT HEART CATH AND CORONARY ANGIOGRAPHY  Thoracic Aortogram   Conclusion    Ost LAD to Mid LAD lesion, 15 %stenosed. Prox RCA lesion, 50 %stenosed.   Normal LV function.    Smooth 15% proximal stenosis in the LAD which was a single vessel arising from the left coronary cusp.  The remainder of the LAD was free of significant disease.   Eccentric 50% very proximal RCA stenosis in a dominant RCA.   Diminutive left circumflex vessel, less than 1 mm, which appeared to arise from near the ostium of the RCA.   Supravalvular aortography was performed.  The aortic valve had normal excursion without AR.  No other takeoff of the circumflex was visualized and appeared that the circumflex arose from the RCA and was diminutive   RECOMMENDATION: The patient recently had abnormal ECG findings on his exercise ECG with normal perfusion on scintigraphic imaging.  Low-dose beta blocker will be added to his amlodipine.  His Crestor dose will be maximized to 40 mg for aggressive lipid-lowering  an attempt at inducing plaque regression.  The patient will follow-up with Dr. Anne Fu. Cardiac cath 08/10/16:   EKG:  EKG is  ordered today.  The ekg ordered today demonstrates NSR rate 72. Normal. I have personally reviewed and interpreted this study.   Recent Labs: No results found for requested labs within last 365 days.  Recent Lipid Panel    Component Value Date/Time   CHOL 115 12/06/2016 0812   TRIG 151 (H) 12/06/2016 0812   HDL 35 (L) 12/06/2016 0812   CHOLHDL 3.3 12/06/2016 0812   LDLCALC 50 12/06/2016 0812   Dated 01/31/21: cholesterol 78, triglycerides 92, HDL 39, LDL 21. Creatinine 1.12. otherwise CMET normal.   Risk Assessment/Calculations:                Physical Exam:    VS:  BP 116/62 (BP Location: Left Arm, Patient Position: Sitting, Cuff Size: Normal)   Pulse 72   Ht 5\' 8"  (1.727 m)   Wt 151 lb 9.6 oz (68.8 kg)   SpO2 98%   BMI 23.05 kg/m     Wt Readings from Last 3 Encounters:  11/01/21 151 lb 9.6 oz (68.8 kg)  10/11/21 161 lb (73 kg)  03/15/21 161 lb (73 kg)     GEN:  Well nourished, well developed in no acute distress HEENT: blind NECK: No JVD; left carotid bruit LYMPHATICS: No lymphadenopathy CARDIAC: RRR, no murmurs, rubs, gallops RESPIRATORY:  Clear to auscultation without rales, wheezing or rhonchi  ABDOMEN: Soft, non-tender, non-distended MUSCULOSKELETAL:  No edema; No deformity  SKIN: Warm and dry NEUROLOGIC:  Alert and oriented x 3 PSYCHIATRIC:  Normal affect   ASSESSMENT:    1. Left-sided carotid artery disease, unspecified type (HCC)   2. Coronary artery disease involving native coronary artery of native heart without angina pectoris    PLAN:    In order of problems listed above:  CAD. Prior evaluation in 2019 with nonobstructive CAD on cath. CT with extensive calcification. No active angina. No prior MI. No CHF. Able to exert at least at 5 met level. Risk factors managed well. Ecg is normal. Patient is cleared to proceed with  plan for carotid stenting. No additional  cardiac tests will improve CV risk which I feel is low to moderate.  Left carotid stenosis.  History of esophageal CAD s/p RT and chemo           Medication Adjustments/Labs and Tests Ordered: Current medicines are reviewed at length with the patient today.  Concerns regarding medicines are outlined above.  No orders of the defined types were placed in this encounter.  No orders of the defined types were placed in this encounter.   There are no Patient Instructions on file for this visit.   Signed, Dylann Gallier [place], MD  11/01/2021 4:30 PM    [facility]

## 2021-11-01 ENCOUNTER — Encounter: Payer: Self-pay | Admitting: Cardiology

## 2021-11-01 ENCOUNTER — Ambulatory Visit: Payer: Medicare Other | Attending: Cardiology | Admitting: Cardiology

## 2021-11-01 VITALS — BP 116/62 | HR 72 | Ht 68.0 in | Wt 151.6 lb

## 2021-11-01 DIAGNOSIS — I2510 Atherosclerotic heart disease of native coronary artery without angina pectoris: Secondary | ICD-10-CM | POA: Insufficient documentation

## 2021-11-01 DIAGNOSIS — I779 Disorder of arteries and arterioles, unspecified: Secondary | ICD-10-CM | POA: Insufficient documentation

## 2021-11-06 ENCOUNTER — Other Ambulatory Visit: Payer: Self-pay

## 2021-11-06 DIAGNOSIS — I6522 Occlusion and stenosis of left carotid artery: Secondary | ICD-10-CM

## 2021-11-10 NOTE — Pre-Procedure Instructions (Signed)
Surgical Instructions    Your procedure is scheduled on Tuesday, November 21, 2021 at 7:30 AM.  Report to [facility] Main Entrance "A" at 5:30 A.M., then check in with the Admitting office.  Call this number if you have problems the morning of surgery:  [phone]   If you have any questions prior to your surgery date call [phone]: Open Monday-Friday 8am-4pm  *If you experience any cold or flu symptoms such as cough, fever, chills, shortness of breath, etc. between now and your scheduled surgery, please notify us.*    Remember:  Do not eat or drink after midnight the night before your surgery    Take these medicines the morning of surgery with A SIP OF WATER:  aspirin  clopidogrel (PLAVIX)  escitalopram (LEXAPRO)  valACYclovir (VALTREX)   IF NEEDED: acetaminophen (TYLENOL)  diphenhydrAMINE (BENADRYL)  prednisoLONE acetate (PRED FORTE) 1 % ophthalmic suspension   As of today, STOP taking any Aleve, Naproxen, Ibuprofen, Motrin, Advil, Goody's, BC's, all herbal medications, fish oil, and all vitamins.                     Do NOT Smoke (Tobacco/Vaping) for 24 hours prior to your procedure.  If you use a CPAP at night, you may bring your mask/headgear for your overnight stay.   Contacts, glasses, piercing's, hearing aid's, dentures or partials may not be worn into surgery, please bring cases for these belongings.    For patients admitted to the hospital, discharge time will be determined by your treatment team.   Patients discharged the day of surgery will not be allowed to drive home, and someone needs to stay with them for 24 hours.  SURGICAL WAITING ROOM VISITATION Patients having surgery or a procedure may have two support people in the waiting area. Visitors may stay in the waiting area during the procedure and switch out with other visitors if needed. Children under the age of 55 must have an adult accompany them who is not the patient. If the patient needs to  stay at the hospital during part of their recovery, the visitor guidelines for inpatient rooms apply.  Please refer to the [facility] website for the visitor guidelines for Inpatients (after your surgery is over and you are in a regular room).    Special instructions:   [facility]- Preparing For Surgery  Before surgery, you can play an important role. Because skin is not sterile, your skin needs to be as free of germs as possible. You can reduce the number of germs on your skin by washing with CHG (chlorahexidine gluconate) Soap before surgery.  CHG is an antiseptic cleaner which kills germs and bonds with the skin to continue killing germs even after washing.    Oral Hygiene is also important to reduce your risk of infection.  Remember - BRUSH YOUR TEETH THE MORNING OF SURGERY WITH YOUR REGULAR TOOTHPASTE  Please do not use if you have an allergy to CHG or antibacterial soaps. If your skin becomes reddened/irritated stop using the CHG.  Do not shave (including legs and underarms) for at least 48 hours prior to first CHG shower. It is OK to shave your face.  Please follow these instructions carefully.   Shower the NIGHT BEFORE SURGERY and the MORNING OF SURGERY  If you chose to wash your hair, wash your hair first as usual with your normal shampoo.  After you shampoo, rinse your hair and body thoroughly to remove the shampoo.  Use CHG  Soap as you would any other liquid soap. You can apply CHG directly to the skin and wash gently with a scrungie or a clean washcloth.   Apply the CHG Soap to your body ONLY FROM THE NECK DOWN.  Do not use on open wounds or open sores. Avoid contact with your eyes, ears, mouth and genitals (private parts). Wash Face and genitals (private parts)  with your normal soap.   Wash thoroughly, paying special attention to the area where your surgery will be performed.  Thoroughly rinse your body with warm water from the neck down.  DO NOT shower/wash with your  normal soap after using and rinsing off the CHG Soap.  Pat yourself dry with a CLEAN TOWEL.  Wear CLEAN PAJAMAS to bed the night before surgery  Place CLEAN SHEETS on your bed the night before your surgery  DO NOT SLEEP WITH PETS.   Day of Surgery: Take a shower with CHG soap. Do not wear jewelry. Do not wear lotions, powders, perfumes/colognes, or deodorant. Do not shave 48 hours prior to surgery.  Men may shave face and neck. Do not bring valuables to the hospital.  [facility] is not responsible for any belongings or valuables. Wear Clean/Comfortable clothing the morning of surgery Do not apply any deodorants/lotions.   Remember to brush your teeth WITH YOUR REGULAR TOOTHPASTE.   Please read over the following fact sheets that you were given.  If you received a COVID test during your pre-op visit  it is requested that you wear a mask when out in public, stay away from anyone that may not be feeling well and notify your surgeon if you develop symptoms. If you have been in contact with anyone that has tested positive in the last 10 days please notify you surgeon.

## 2021-11-13 ENCOUNTER — Encounter (HOSPITAL_COMMUNITY)
Admission: RE | Admit: 2021-11-13 | Discharge: 2021-11-13 | Disposition: A | Discharge status: Home / Self Care | Payer: Medicare Other | Source: Ambulatory Visit | Attending: Vascular Surgery | Admitting: Vascular Surgery

## 2021-11-13 ENCOUNTER — Other Ambulatory Visit: Payer: Self-pay

## 2021-11-13 ENCOUNTER — Encounter (HOSPITAL_COMMUNITY): Payer: Self-pay

## 2021-11-13 VITALS — BP 106/60 | HR 71 | Temp 97.6°F | Resp 17 | Ht 68.0 in | Wt 149.6 lb

## 2021-11-13 DIAGNOSIS — Z8501 Personal history of malignant neoplasm of esophagus: Secondary | ICD-10-CM | POA: Insufficient documentation

## 2021-11-13 DIAGNOSIS — N400 Enlarged prostate without lower urinary tract symptoms: Secondary | ICD-10-CM | POA: Insufficient documentation

## 2021-11-13 DIAGNOSIS — Z87891 Personal history of nicotine dependence: Secondary | ICD-10-CM | POA: Insufficient documentation

## 2021-11-13 DIAGNOSIS — I10 Essential (primary) hypertension: Secondary | ICD-10-CM | POA: Insufficient documentation

## 2021-11-13 DIAGNOSIS — E782 Mixed hyperlipidemia: Secondary | ICD-10-CM | POA: Insufficient documentation

## 2021-11-13 DIAGNOSIS — I6523 Occlusion and stenosis of bilateral carotid arteries: Secondary | ICD-10-CM | POA: Diagnosis not present

## 2021-11-13 DIAGNOSIS — M1990 Unspecified osteoarthritis, unspecified site: Secondary | ICD-10-CM | POA: Insufficient documentation

## 2021-11-13 DIAGNOSIS — I6522 Occlusion and stenosis of left carotid artery: Secondary | ICD-10-CM | POA: Diagnosis not present

## 2021-11-13 DIAGNOSIS — Z01818 Encounter for other preprocedural examination: Secondary | ICD-10-CM

## 2021-11-13 DIAGNOSIS — Z01812 Encounter for preprocedural laboratory examination: Secondary | ICD-10-CM | POA: Insufficient documentation

## 2021-11-13 HISTORY — DX: Malignant neoplasm of esophagus, unspecified: C15.9

## 2021-11-13 LAB — CBC
HCT: 37.8 % — ABNORMAL LOW (ref 39.0–52.0)
Hemoglobin: 13.3 g/dL (ref 13.0–17.0)
MCH: 36.3 pg — ABNORMAL HIGH (ref 26.0–34.0)
MCHC: 35.2 g/dL (ref 30.0–36.0)
MCV: 103.3 fL — ABNORMAL HIGH (ref 80.0–100.0)
Platelets: 129 10*3/uL — ABNORMAL LOW (ref 150–400)
RBC: 3.66 MIL/uL — ABNORMAL LOW (ref 4.22–5.81)
RDW: 13.8 % (ref 11.5–15.5)
WBC: 7.8 10*3/uL (ref 4.0–10.5)
nRBC: 0 % (ref 0.0–0.2)

## 2021-11-13 LAB — PROTIME-INR
INR: 1.2 (ref 0.8–1.2)
Prothrombin Time: 15.1 seconds (ref 11.4–15.2)

## 2021-11-13 LAB — TYPE AND SCREEN
ABO/RH(D): O NEG
Antibody Screen: NEGATIVE

## 2021-11-13 LAB — URINALYSIS, ROUTINE W REFLEX MICROSCOPIC
Bilirubin Urine: NEGATIVE
Glucose, UA: NEGATIVE mg/dL
Hgb urine dipstick: NEGATIVE
Ketones, ur: NEGATIVE mg/dL
Nitrite: NEGATIVE
Protein, ur: 100 mg/dL — AB
Specific Gravity, Urine: 1.025 (ref 1.005–1.030)
pH: 5 (ref 5.0–8.0)

## 2021-11-13 LAB — COMPREHENSIVE METABOLIC PANEL
ALT: 24 U/L (ref 0–44)
AST: 31 U/L (ref 15–41)
Albumin: 4 g/dL (ref 3.5–5.0)
Alkaline Phosphatase: 67 U/L (ref 38–126)
Anion gap: 8 (ref 5–15)
BUN: 13 mg/dL (ref 8–23)
CO2: 25 mmol/L (ref 22–32)
Calcium: 9.4 mg/dL (ref 8.9–10.3)
Chloride: 106 mmol/L (ref 98–111)
Creatinine, Ser: 1.27 mg/dL — ABNORMAL HIGH (ref 0.61–1.24)
GFR, Estimated: 57 mL/min — ABNORMAL LOW (ref >60)
Glucose, Bld: 86 mg/dL (ref 70–99)
Potassium: 3.9 mmol/L (ref 3.5–5.1)
Sodium: 139 mmol/L (ref 135–145)
Total Bilirubin: 0.8 mg/dL (ref 0.3–1.2)
Total Protein: 6.7 g/dL (ref 6.5–8.1)

## 2021-11-13 LAB — SURGICAL PCR SCREEN
MRSA, PCR: NEGATIVE
Staphylococcus aureus: NEGATIVE

## 2021-11-13 LAB — APTT: aPTT: 29 seconds (ref 24–36)

## 2021-11-13 NOTE — Progress Notes (Signed)
   11/13/21 1414  OBSTRUCTIVE SLEEP APNEA  Have you ever been diagnosed with sleep apnea through a sleep study? No  Do you snore loudly (loud enough to be heard through closed doors)?  1  Do you often feel tired, fatigued, or sleepy during the daytime (such as falling asleep during driving or talking to someone)? 0  Has anyone observed you stop breathing during your sleep? 0  Do you have, or are you being treated for high blood pressure? 1  BMI more than 35 kg/m2? 0  Age > 50 (1-yes) 1  Neck circumference greater than:Male 16 inches or larger, Male 17inches or larger? 1  Male Gender (Yes=1) 1  Obstructive Sleep Apnea Score 5  Score 5 or greater  Results sent to PCP

## 2021-11-13 NOTE — Progress Notes (Signed)
PCP - Dr. Lona Kettle Cardiologist - Dr. Peter Martinique  PPM/ICD - Denies  Chest x-ray - N/A EKG - 11/01/21 Stress Test - 08/07/16 ECHO - 08/07/16 Cardiac Cath - 08/10/16  Sleep Study - Denies  Diabetes: Denies  Blood Thinner Instructions: Continue Aspirin Instructions: Continue  ERAS Protcol - No  COVID TEST- N/A   Anesthesia review: Yes, cardiac hx  Patient denies shortness of breath, fever, cough and chest pain at PAT appointment   All instructions explained to the patient, with a verbal understanding of the material. Patient agrees to go over the instructions while at home for a better understanding. Patient also instructed to self quarantine after being tested for COVID-19. The opportunity to ask questions was provided.

## 2021-11-14 ENCOUNTER — Encounter (HOSPITAL_COMMUNITY): Payer: Self-pay

## 2021-11-14 NOTE — Progress Notes (Signed)
Anesthesia Chart Review:  Case: [id number] Date/Time: 11/21/21 0715   Procedure: Left Transcarotid Artery Revascularization (Left)   Anesthesia type: General   Pre-op diagnosis: Asymptomatic Left ICA Stenosis   Location: [facility] OR ROOM 11 / [place] [facility]   Surgeons: Waynetta Sandy, MD       DISCUSSION: Patient will be an 81 year old male by his surgery date. He is scheduled for left TCAR on 11/21/21 by Dr. Donzetta Matters for asymptomatic LICA stenosis.  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  Patient had a preoperative cardiology evaluation by Dr. Martinique on 11/01/21. He had previously seen Dr. Candee Furbish in 2019 and had mild-moderate non-obstructive CAD by 08/10/16 cath (15% pLAD, 50% pRCA, diminutive LCX). CAD. Prior evaluation in 2019 with nonobstructive CAD on cath. CT with extensive calcification. No active angina. No prior MI. No CHF. Able to exert at least at 5 met level. Risk factors managed well. Ecg is normal. Patient is cleared to proceed with plan for carotid stenting. No additional cardiac tests will improve CV risk which I feel is low to moderate."  He is to continue ASA and Plavix.  Anesthesia team to evaluate on the day of surgery.   VS: BP 106/60   Pulse 71   Temp 36.4 C   Resp 17   Ht 5' 8" (1.727 m)   Wt 67.9 kg   SpO2 99%   BMI 22.75 kg/m    PROVIDERS: Lawerance Cruel, MD is PCP  Martinique, Peter, MD is cardiologist  Karmen Stabs, MD is HEM-ONC ([facility]) Beverely Pace, MD is RAD-ONC ([facility]) Yvonna Alanis, MD is ophthalmologist ([facility]) Wonda Amis, MD is ENT ([facility]) Evalyn Casco, PA-C with behavioral health provider ([facility])   LABS: Labs reviewed: Acceptable for surgery. (all labs ordered are listed, but only abnormal results are displayed)  Labs Reviewed  CBC - Abnormal; Notable for the following components:      Result Value   RBC 3.66 (*)    HCT 37.8 (*)    MCV 103.3 (*)    MCH 36.3 (*)    Platelets 129 (*)    All other components within normal limits  COMPREHENSIVE METABOLIC PANEL - Abnormal; Notable for the following components:   Creatinine, Ser 1.27 (*)    GFR, Estimated 57 (*)    All other components within normal limits  URINALYSIS, ROUTINE W REFLEX MICROSCOPIC - Abnormal; Notable for the following components:   Color, Urine AMBER (*)    APPearance CLOUDY (*)    Protein, ur 100 (*)    Leukocytes,Ua TRACE (*)    Bacteria, UA RARE (*)    All other components within normal limits  SURGICAL PCR SCREEN  PROTIME-INR  APTT  TYPE AND SCREEN     IMAGES: CT Chest 08/14/21 ([facility]): IMPRESSION:  1. Interval increase in size of right middle lobe nodule, now measuring 8  mm average diameter. Location and size would make tissue sampling  difficult. Recommend either short interval follow-up, or PET/CT.   CT Abd/pelvis 08/14/21: Impression:  No evidence of metastatic disease in the abdomen or  pelvis.   CTA Neck 02/21/21: IMPRESSION: 1. Occlusion of the right internal carotid artery just distal to its origin without distal reconstitution. 2. Approximately 70% stenosis of the proximal left internal carotid artery due to mixed density plaque. 3. 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 Aortic Atherosclerosis (ICD10-I70.0) and Emphysema (ICD10-J43.9).   EKG: 11/01/21: NSR   CV: US Carotid 10/10/21: Summary:  - Right Carotid: Evidence consistent with a total occlusion of the  right  ICA.  - Left Carotid: Velocities in the left ICA are consistent with a 80-99%  stenosis.  - Vertebrals: Bilateral vertebral arteries demonstrate antegrade flow.  - Subclavians: Left subclavian artery flow was disturbed. Normal flow  hemodynamics              were seen in the right subclavian artery.    Seven day Holter Monitor 12/18/19 ([facility]): 1. Study quality adequate for interpretation.  2. Sinus mechanism rhythms noted throughout.  3. Ventricular ectopy (<1% of total complexes) consisted of PVCs.  4.     Supraventricular ectopy (<1% of total complexes) consisted of PACs, couplets, and  supraventricular runs (longest 7 beats).  5. No diary events entered.    Echo with bubble study 06/19/19 ([facility]): NORMAL LEFT VENTRICULAR SYSTOLIC FUNCTION WITH MILD LVH. Estimated EF > 55%. Calculated EF 53%. NORMAL LA PRESSURES WITH NORMAL DIASTOLIC FUNCTION  NORMAL RIGHT VENTRICULAR SYSTOLIC FUNCTION  VALVULAR REGURGITATION: MILD AR, TRIVIAL PR, TRIVIAL TR  NO VALVULAR STENOSIS  IAS ANEURYSM  POOR SUBCOSTAL WINDOWS.  MINIMAL LV CAVITARY GRADIENT SEEN AT REST (1.1 m/s; 5 mmHg) THAT INCREASES WITH VALSALVA (2.22ms; 33 mmHg).  NORMAL SIZE IVC WITH NORMAL COLLAPSE.  NEGATIVE SALINE CONTRAST STUDY    Cardiac cath 08/10/16:  Ost LAD to Mid LAD lesion, 15 %stenosed. Prox RCA lesion, 50 %stenosed.   Normal LV function.    Smooth 15% proximal stenosis in the LAD which was a single vessel arising from the left coronary cusp.  The remainder of the LAD was free of significant disease.   Eccentric 50% very proximal RCA stenosis in a dominant RCA.   Diminutive left circumflex vessel, less than 1 mm, which appeared to arise from near the ostium of the RCA.   Supravalvular aortography was performed.  The aortic valve had normal excursion without AR.  No other takeoff of the circumflex was visualized and appeared that the circumflex arose from the RCA and was diminutive   RECOMMENDATION: The  patient recently had abnormal ECG findings on his exercise ECG with normal perfusion on scintigraphic imaging.  Low-dose beta blocker will be added to his amlodipine.  His Crestor dose will be maximized to 40 mg for aggressive lipid-lowering an attempt at inducing plaque regression.  The patient will follow-up with Dr. SMarlou Porch   Nuclear stress test 08/07/16: Nuclear stress EF: 68%. Blood pressure demonstrated a normal response to exercise. Horizontal ST segment depression ST segment depression of 2 mm was noted during stress in the aVF, V6, V5, V4, III and II leads, beginning at 6 minutes of stress, and returning to baseline after 1-5 minutes of recovery. The study is normal. This is a low risk study.   Low risk stress nuclear study with normal perfusion and normal left ventricular regional and global systolic function. The ECG stress test is markedly abnormal. This could represent a "false positive" ECG response, but possible "balanced ischemia" (e.g. stenotic dominant left coronary artery) should be considered.   Past Medical History:  Diagnosis Date  ADHD    Blind in both eyes    bilateral retinal necrosis sue to Varicella Zoater 06/2019   BPH (benign prostatic hyperplasia)    Carotid artery occlusion    known CTO RICA, 37-90% LICA 24/0/97   Colonic polyp    Depression    Elevated fasting glucose    Elevated PSA    Esophageal cancer (HCC)    diagnosed 11/2017; + left paratracheal LN 04/13/19, s/p chemoradiation ([facility])   Essential hypertension, benign    Hypertension    Kidney stones    Mixed dyslipidemia    Osteoarthritis    Especially of the left kneee which end stage    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 Wonda Amis    Past Surgical History:  Procedure Laterality Date   COLONOSCOPY     [place]   medial collateral ligament   LEFT HEART CATH AND CORONARY ANGIOGRAPHY N/A 08/10/2016   Procedure: LEFT HEART CATH AND  CORONARY ANGIOGRAPHY;  Surgeon: Troy Sine, MD;  Location: [place] [facility];  Service: Cardiovascular;  Laterality: N/A;   LITHOTRIPSY  [place] Left 2010   THORACIC AORTOGRAM N/A 08/10/2016   Procedure: Thoracic Aortogram;  Surgeon: Troy Sine, MD;  Location: [place] [facility];  Service: Cardiovascular;  Laterality: N/A;    MEDICATIONS:  acetaminophen (TYLENOL) 500 MG tablet   Ascorbic Acid (VITAMIN C) 1000 MG tablet   aspirin 81 MG EC tablet   atropine 1 % ophthalmic solution   B Complex Vitamins (VITAMIN-B COMPLEX PO)   clopidogrel (PLAVIX) 75 MG tablet   diphenhydrAMINE (BENADRYL) 25 MG tablet   escitalopram (LEXAPRO) 20 MG tablet   FERROUS SULFATE PO   lamoTRIgine (LAMICTAL) 100 MG tablet   lamoTRIgine (LAMICTAL) 25 MG tablet   pramoxine (CERAVE ITCH RELIEF) 1 % LOTN   prednisoLONE acetate (PRED FORTE) 1 % ophthalmic suspension   pyrithione zinc (DANDRUFF SHAMPOO) 1 % shampoo   ramipril (ALTACE) 2.5 MG capsule   rosuvastatin (CRESTOR) 40 MG tablet   traZODone (DESYREL) 150 MG tablet   valACYclovir (VALTREX) 1000 MG tablet   No current facility-administered medications for this encounter.    Myra Gianotti, PA-C Surgical Short Stay/Anesthesiology [facility] Phone [phone] [facility] Phone [phone] 11/14/2021 3:20 PM

## 2021-11-14 NOTE — Anesthesia Preprocedure Evaluation (Addendum)
Anesthesia Evaluation  Patient identified by MRN, date of birth, ID band Patient awake    Reviewed: Allergy & Precautions, NPO status , Patient's Chart, lab work & pertinent test results  History of Anesthesia Complications Negative for: history of anesthetic complications  Airway Mallampati: III  TM Distance: >3 FB Neck ROM: Full    Dental no notable dental hx. 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 on medications + CAD and + Peripheral Vascular Disease  Normal cardiovascular exam  Echo with bubble study 06/19/19 ([facility]): NORMAL LEFT VENTRICULAR SYSTOLIC FUNCTION WITH MILD LVH. Estimated EF > 55%. Calculated EF 53%. NORMAL LA PRESSURES WITH NORMAL DIASTOLIC FUNCTION  NORMAL RIGHT VENTRICULAR SYSTOLIC FUNCTION  VALVULAR REGURGITATION: MILD AR, TRIVIAL PR, TRIVIAL TR  NO VALVULAR STENOSIS  IAS ANEURYSM  POOR SUBCOSTAL WINDOWS.  MINIMAL LV CAVITARY GRADIENT SEEN AT REST (1.1 m/s; 5 mmHg) THAT INCREASES WITH VALSALVA (2.13ms; 33 mmHg).  NORMAL SIZE IVC WITH NORMAL COLLAPSE.  NEGATIVE SALINE CONTRAST STUDY      Cardiac cath 08/10/16:   Ost LAD to Mid LAD lesion, 15 %stenosed.  Prox RCA lesion, 50 %stenosed.   Normal LV function.    Smooth 15% proximal stenosis in the LAD which was a single vessel arising from the left coronary cusp.  The remainder of the LAD was free of significant disease.    Neuro/Psych  PSYCHIATRIC DISORDERS Anxiety Depression    negative neurological ROS     GI/Hepatic negative GI ROS, Neg liver ROS,,,Hx esophageal Ca   Endo/Other  negative endocrine ROS    Renal/GU negative Renal ROS     Musculoskeletal negative musculoskeletal ROS (+)    Abdominal   Peds  Hematology negative hematology ROS (+)   Anesthesia Other Findings   Reproductive/Obstetrics                              Anesthesia Physical Anesthesia Plan  ASA: 3  Anesthesia Plan: General   Post-op Pain Management: Tylenol PO (pre-op)*   Induction:   PONV Risk Score and Plan: 4 or greater and Ondansetron, Dexamethasone, Diphenhydramine and Treatment may vary due to age or medical condition  Airway Management Planned: Oral ETT  Additional Equipment: Arterial line  Intra-op Plan:   Post-operative Plan:   Informed Consent: I have reviewed the patients History and Physical, chart, labs and discussed the procedure including the risks, benefits and alternatives for the proposed anesthesia with the patient or authorized representative who has indicated his/her understanding and acceptance.     Dental advisory given  Plan Discussed with: Anesthesiologist and CRNA  Anesthesia Plan Comments: (PAT note written 11/14/2021 by AMyra Gianotti PA-C.  )       Anesthesia Quick Evaluation

## 2021-11-21 ENCOUNTER — Encounter (HOSPITAL_COMMUNITY): Payer: Self-pay | Admitting: Vascular Surgery

## 2021-11-21 ENCOUNTER — Other Ambulatory Visit: Payer: Self-pay

## 2021-11-21 ENCOUNTER — Encounter (HOSPITAL_COMMUNITY)
Admission: RE | Disposition: A | Discharge status: Home Health | Payer: Self-pay | Source: Home / Self Care | Attending: Vascular Surgery

## 2021-11-21 ENCOUNTER — Inpatient Hospital Stay (HOSPITAL_COMMUNITY): Payer: Medicare Other | Admitting: Anesthesiology

## 2021-11-21 ENCOUNTER — Inpatient Hospital Stay (HOSPITAL_COMMUNITY): Payer: Medicare Other

## 2021-11-21 ENCOUNTER — Inpatient Hospital Stay (HOSPITAL_COMMUNITY): Payer: Medicare Other | Admitting: Vascular Surgery

## 2021-11-21 ENCOUNTER — Inpatient Hospital Stay (HOSPITAL_COMMUNITY)
Admission: RE | Admit: 2021-11-21 | Discharge: 2021-11-22 | DRG: 036 | Disposition: A | Discharge status: Home Health | Payer: Medicare Other | Attending: Vascular Surgery | Admitting: Vascular Surgery

## 2021-11-21 DIAGNOSIS — Z8249 Family history of ischemic heart disease and other diseases of the circulatory system: Secondary | ICD-10-CM

## 2021-11-21 DIAGNOSIS — Z006 Encounter for examination for normal comparison and control in clinical research program: Secondary | ICD-10-CM | POA: Diagnosis not present

## 2021-11-21 DIAGNOSIS — Z79899 Other long term (current) drug therapy: Secondary | ICD-10-CM | POA: Diagnosis not present

## 2021-11-21 DIAGNOSIS — I6522 Occlusion and stenosis of left carotid artery: Secondary | ICD-10-CM | POA: Diagnosis not present

## 2021-11-21 DIAGNOSIS — I10 Essential (primary) hypertension: Secondary | ICD-10-CM

## 2021-11-21 DIAGNOSIS — Z9221 Personal history of antineoplastic chemotherapy: Secondary | ICD-10-CM

## 2021-11-21 DIAGNOSIS — I6523 Occlusion and stenosis of bilateral carotid arteries: Secondary | ICD-10-CM | POA: Diagnosis present

## 2021-11-21 DIAGNOSIS — Z7982 Long term (current) use of aspirin: Secondary | ICD-10-CM

## 2021-11-21 DIAGNOSIS — Z87891 Personal history of nicotine dependence: Secondary | ICD-10-CM | POA: Diagnosis not present

## 2021-11-21 DIAGNOSIS — Z888 Allergy status to other drugs, medicaments and biological substances status: Secondary | ICD-10-CM

## 2021-11-21 DIAGNOSIS — Z808 Family history of malignant neoplasm of other organs or systems: Secondary | ICD-10-CM | POA: Diagnosis not present

## 2021-11-21 DIAGNOSIS — I2510 Atherosclerotic heart disease of native coronary artery without angina pectoris: Secondary | ICD-10-CM | POA: Diagnosis not present

## 2021-11-21 DIAGNOSIS — F32A Depression, unspecified: Secondary | ICD-10-CM | POA: Diagnosis present

## 2021-11-21 DIAGNOSIS — E782 Mixed hyperlipidemia: Secondary | ICD-10-CM | POA: Diagnosis present

## 2021-11-21 DIAGNOSIS — Z882 Allergy status to sulfonamides status: Secondary | ICD-10-CM

## 2021-11-21 DIAGNOSIS — Z96652 Presence of left artificial knee joint: Secondary | ICD-10-CM | POA: Diagnosis present

## 2021-11-21 DIAGNOSIS — Z818 Family history of other mental and behavioral disorders: Secondary | ICD-10-CM | POA: Diagnosis not present

## 2021-11-21 DIAGNOSIS — Z923 Personal history of irradiation: Secondary | ICD-10-CM | POA: Diagnosis not present

## 2021-11-21 DIAGNOSIS — Z8501 Personal history of malignant neoplasm of esophagus: Secondary | ICD-10-CM | POA: Diagnosis not present

## 2021-11-21 DIAGNOSIS — Z7902 Long term (current) use of antithrombotics/antiplatelets: Secondary | ICD-10-CM

## 2021-11-21 DIAGNOSIS — Z87442 Personal history of urinary calculi: Secondary | ICD-10-CM | POA: Diagnosis not present

## 2021-11-21 DIAGNOSIS — Z833 Family history of diabetes mellitus: Secondary | ICD-10-CM

## 2021-11-21 DIAGNOSIS — I6529 Occlusion and stenosis of unspecified carotid artery: Principal | ICD-10-CM

## 2021-11-21 DIAGNOSIS — Z8719 Personal history of other diseases of the digestive system: Secondary | ICD-10-CM | POA: Diagnosis not present

## 2021-11-21 DIAGNOSIS — N400 Enlarged prostate without lower urinary tract symptoms: Secondary | ICD-10-CM | POA: Diagnosis present

## 2021-11-21 DIAGNOSIS — H543 Unqualified visual loss, both eyes: Secondary | ICD-10-CM | POA: Diagnosis present

## 2021-11-21 HISTORY — PX: ULTRASOUND GUIDANCE FOR VASCULAR ACCESS: SHX6516

## 2021-11-21 HISTORY — PX: TRANSCAROTID ARTERY REVASCULARIZATIONÂ: SHX6778

## 2021-11-21 SURGERY — TRANSCAROTID ARTERY REVASCULARIZATION (TCAR)
Anesthesia: General | Site: Neck | Laterality: Right

## 2021-11-21 MED ORDER — OXYCODONE-ACETAMINOPHEN 5-325 MG PO TABS
1.0000 | ORAL_TABLET | ORAL | Status: DC | PRN
  Administered 2021-11-21: 2 via ORAL
  Filled 2021-11-21: qty 2

## 2021-11-21 MED ORDER — GLYCOPYRROLATE 0.2 MG/ML IJ SOLN
INTRAMUSCULAR | Status: DC | PRN
  Administered 2021-11-21: .2 mg via INTRAVENOUS

## 2021-11-21 MED ORDER — SODIUM CHLORIDE 0.9 % IV SOLN: INTRAVENOUS | Status: DC

## 2021-11-21 MED ORDER — LAMOTRIGINE 25 MG PO TABS
25.0000 mg | ORAL_TABLET | Freq: Every day | ORAL | Status: DC
  Administered 2021-11-21: 25 mg via ORAL

## 2021-11-21 MED ORDER — DEXAMETHASONE SODIUM PHOSPHATE 10 MG/ML IJ SOLN
INTRAMUSCULAR | Status: DC | PRN
  Administered 2021-11-21: 8 mg via INTRAVENOUS

## 2021-11-21 MED ORDER — ONDANSETRON HCL 4 MG/2ML IJ SOLN
INTRAMUSCULAR | Status: DC | PRN
  Administered 2021-11-21: 4 mg via INTRAVENOUS

## 2021-11-21 MED ORDER — LACTATED RINGERS IV SOLN: INTRAVENOUS | Status: DC

## 2021-11-21 MED ORDER — GLYCOPYRROLATE PF 0.2 MG/ML IJ SOSY
PREFILLED_SYRINGE | INTRAMUSCULAR | Status: AC
  Filled 2021-11-21: qty 1

## 2021-11-21 MED ORDER — ACETAMINOPHEN 325 MG PO TABS: 325.0000 mg | ORAL_TABLET | ORAL | Status: DC | PRN

## 2021-11-21 MED ORDER — RAMIPRIL 1.25 MG PO CAPS
2.5000 mg | ORAL_CAPSULE | Freq: Every day | ORAL | Status: DC
  Administered 2021-11-21: 2.5 mg via ORAL
  Filled 2021-11-21: qty 2

## 2021-11-21 MED ORDER — PHENYLEPHRINE HCL-NACL 20-0.9 MG/250ML-% IV SOLN
INTRAVENOUS | Status: DC | PRN
  Administered 2021-11-21: 40 ug/min via INTRAVENOUS

## 2021-11-21 MED ORDER — FENTANYL CITRATE (PF) 100 MCG/2ML IJ SOLN
25.0000 ug | INTRAMUSCULAR | Status: DC | PRN
  Administered 2021-11-21: 50 ug via INTRAVENOUS

## 2021-11-21 MED ORDER — PROPOFOL 10 MG/ML IV BOLUS
INTRAVENOUS | Status: AC
  Filled 2021-11-21: qty 20

## 2021-11-21 MED ORDER — HEPARIN SODIUM (PORCINE) 1000 UNIT/ML IJ SOLN
INTRAMUSCULAR | Status: DC | PRN
  Administered 2021-11-21: 8000 [IU] via INTRAVENOUS

## 2021-11-21 MED ORDER — FENTANYL CITRATE (PF) 100 MCG/2ML IJ SOLN
INTRAMUSCULAR | Status: AC
  Filled 2021-11-21: qty 2

## 2021-11-21 MED ORDER — HEPARIN 6000 UNIT IRRIGATION SOLUTION
Status: AC
  Filled 2021-11-21: qty 500

## 2021-11-21 MED ORDER — LABETALOL HCL 5 MG/ML IV SOLN
INTRAVENOUS | Status: AC
  Filled 2021-11-21: qty 4

## 2021-11-21 MED ORDER — ONDANSETRON HCL 4 MG/2ML IJ SOLN: 4.0000 mg | Freq: Four times a day (QID) | INTRAMUSCULAR | Status: DC | PRN

## 2021-11-21 MED ORDER — 0.9 % SODIUM CHLORIDE (POUR BTL) OPTIME
TOPICAL | Status: DC | PRN
  Administered 2021-11-21: 1000 mL

## 2021-11-21 MED ORDER — DEXAMETHASONE SODIUM PHOSPHATE 10 MG/ML IJ SOLN
INTRAMUSCULAR | Status: AC
  Filled 2021-11-21: qty 1

## 2021-11-21 MED ORDER — SUGAMMADEX SODIUM 200 MG/2ML IV SOLN
INTRAVENOUS | Status: DC | PRN
  Administered 2021-11-21: 200 mg via INTRAVENOUS

## 2021-11-21 MED ORDER — CHLORHEXIDINE GLUCONATE CLOTH 2 % EX PADS: 6.0000 | MEDICATED_PAD | Freq: Once | CUTANEOUS | Status: DC

## 2021-11-21 MED ORDER — LIDOCAINE 2% (20 MG/ML) 5 ML SYRINGE
INTRAMUSCULAR | Status: DC | PRN
  Administered 2021-11-21: 100 mg via INTRAVENOUS

## 2021-11-21 MED ORDER — ROCURONIUM BROMIDE 10 MG/ML (PF) SYRINGE
PREFILLED_SYRINGE | INTRAVENOUS | Status: AC
  Filled 2021-11-21: qty 10

## 2021-11-21 MED ORDER — HEPARIN SODIUM (PORCINE) 1000 UNIT/ML IJ SOLN
INTRAMUSCULAR | Status: AC
  Filled 2021-11-21: qty 10

## 2021-11-21 MED ORDER — CEFAZOLIN SODIUM-DEXTROSE 2-4 GM/100ML-% IV SOLN
2.0000 g | Freq: Three times a day (TID) | INTRAVENOUS | Status: AC
  Administered 2021-11-21 (×2): 2 g via INTRAVENOUS
  Filled 2021-11-21 (×2): qty 100

## 2021-11-21 MED ORDER — ORAL CARE MOUTH RINSE: 15.0000 mL | Freq: Once | OROMUCOSAL | Status: AC

## 2021-11-21 MED ORDER — ACETAMINOPHEN 500 MG PO TABS
1000.0000 mg | ORAL_TABLET | Freq: Once | ORAL | Status: AC
  Administered 2021-11-21: 1000 mg via ORAL
  Filled 2021-11-21: qty 2

## 2021-11-21 MED ORDER — HYDRALAZINE HCL 20 MG/ML IJ SOLN: 5.0000 mg | INTRAMUSCULAR | Status: DC | PRN

## 2021-11-21 MED ORDER — ROSUVASTATIN CALCIUM 20 MG PO TABS
40.0000 mg | ORAL_TABLET | Freq: Every day | ORAL | Status: DC
  Administered 2021-11-21: 40 mg via ORAL
  Filled 2021-11-21: qty 2

## 2021-11-21 MED ORDER — ASPIRIN 81 MG PO TBEC
162.0000 mg | DELAYED_RELEASE_TABLET | Freq: Every day | ORAL | Status: DC
  Administered 2021-11-22: 162 mg via ORAL
  Filled 2021-11-21: qty 2

## 2021-11-21 MED ORDER — POTASSIUM CHLORIDE CRYS ER 20 MEQ PO TBCR: 20.0000 meq | EXTENDED_RELEASE_TABLET | Freq: Every day | ORAL | Status: DC | PRN

## 2021-11-21 MED ORDER — PHENYLEPHRINE 80 MCG/ML (10ML) SYRINGE FOR IV PUSH (FOR BLOOD PRESSURE SUPPORT)
PREFILLED_SYRINGE | INTRAVENOUS | Status: DC | PRN
  Administered 2021-11-21: 80 ug via INTRAVENOUS
  Administered 2021-11-21: 120 ug via INTRAVENOUS

## 2021-11-21 MED ORDER — METOPROLOL TARTRATE 5 MG/5ML IV SOLN: 2.0000 mg | INTRAVENOUS | Status: DC | PRN

## 2021-11-21 MED ORDER — EPHEDRINE SULFATE-NACL 50-0.9 MG/10ML-% IV SOSY
PREFILLED_SYRINGE | INTRAVENOUS | Status: DC | PRN
  Administered 2021-11-21 (×4): 2.5 mg via INTRAVENOUS

## 2021-11-21 MED ORDER — FENTANYL CITRATE (PF) 250 MCG/5ML IJ SOLN
INTRAMUSCULAR | Status: AC
  Filled 2021-11-21: qty 5

## 2021-11-21 MED ORDER — PROTAMINE SULFATE 10 MG/ML IV SOLN
INTRAVENOUS | Status: DC | PRN
  Administered 2021-11-21: 10 mg via INTRAVENOUS
  Administered 2021-11-21: 40 mg via INTRAVENOUS

## 2021-11-21 MED ORDER — SODIUM CHLORIDE 0.9 % IV SOLN
0.1500 ug/kg/min | INTRAVENOUS | Status: AC
  Administered 2021-11-21: .2 ug/kg/min via INTRAVENOUS
  Filled 2021-11-21: qty 1000

## 2021-11-21 MED ORDER — LABETALOL HCL 5 MG/ML IV SOLN
10.0000 mg | INTRAVENOUS | Status: AC | PRN
  Administered 2021-11-21 (×2): 10 mg via INTRAVENOUS

## 2021-11-21 MED ORDER — LACTATED RINGERS IV SOLN: INTRAVENOUS | Status: DC | PRN

## 2021-11-21 MED ORDER — AMISULPRIDE (ANTIEMETIC) 5 MG/2ML IV SOLN: 10.0000 mg | Freq: Once | INTRAVENOUS | Status: DC | PRN

## 2021-11-21 MED ORDER — ROCURONIUM BROMIDE 10 MG/ML (PF) SYRINGE
PREFILLED_SYRINGE | INTRAVENOUS | Status: DC | PRN
  Administered 2021-11-21: 50 mg via INTRAVENOUS

## 2021-11-21 MED ORDER — SUCCINYLCHOLINE CHLORIDE 200 MG/10ML IV SOSY
PREFILLED_SYRINGE | INTRAVENOUS | Status: DC | PRN
  Administered 2021-11-21: 120 mg via INTRAVENOUS

## 2021-11-21 MED ORDER — ACETAMINOPHEN 325 MG RE SUPP: 325.0000 mg | RECTAL | Status: DC | PRN

## 2021-11-21 MED ORDER — SODIUM CHLORIDE 0.9 % IV SOLN: 500.0000 mL | Freq: Once | INTRAVENOUS | Status: DC | PRN

## 2021-11-21 MED ORDER — IODIXANOL 320 MG/ML IV SOLN
INTRAVENOUS | Status: DC | PRN
  Administered 2021-11-21: 25 mL via INTRA_ARTERIAL

## 2021-11-21 MED ORDER — LIDOCAINE HCL (PF) 1 % IJ SOLN
INTRAMUSCULAR | Status: AC
  Filled 2021-11-21: qty 30

## 2021-11-21 MED ORDER — CHLORHEXIDINE GLUCONATE 0.12 % MT SOLN
15.0000 mL | Freq: Once | OROMUCOSAL | Status: AC
  Administered 2021-11-21: 15 mL via OROMUCOSAL
  Filled 2021-11-21: qty 15

## 2021-11-21 MED ORDER — LIDOCAINE 2% (20 MG/ML) 5 ML SYRINGE
INTRAMUSCULAR | Status: AC
  Filled 2021-11-21: qty 5

## 2021-11-21 MED ORDER — MAGNESIUM SULFATE 2 GM/50ML IV SOLN: 2.0000 g | Freq: Every day | INTRAVENOUS | Status: DC | PRN

## 2021-11-21 MED ORDER — PROTAMINE SULFATE 10 MG/ML IV SOLN
INTRAVENOUS | Status: AC
  Filled 2021-11-21: qty 5

## 2021-11-21 MED ORDER — CLOPIDOGREL BISULFATE 75 MG PO TABS
75.0000 mg | ORAL_TABLET | Freq: Every day | ORAL | Status: DC
  Administered 2021-11-22: 75 mg via ORAL
  Filled 2021-11-21: qty 1

## 2021-11-21 MED ORDER — PROPOFOL 10 MG/ML IV BOLUS
INTRAVENOUS | Status: DC | PRN
  Administered 2021-11-21: 120 mg via INTRAVENOUS

## 2021-11-21 MED ORDER — DOCUSATE SODIUM 100 MG PO CAPS
100.0000 mg | ORAL_CAPSULE | Freq: Every day | ORAL | Status: DC
  Administered 2021-11-22: 100 mg via ORAL
  Filled 2021-11-21: qty 1

## 2021-11-21 MED ORDER — LAMOTRIGINE 100 MG PO TABS
100.0000 mg | ORAL_TABLET | Freq: Every day | ORAL | Status: DC
  Administered 2021-11-21: 100 mg via ORAL
  Filled 2021-11-21: qty 1

## 2021-11-21 MED ORDER — ALUM & MAG HYDROXIDE-SIMETH 200-200-20 MG/5ML PO SUSP: 15.0000 mL | ORAL | Status: DC | PRN

## 2021-11-21 MED ORDER — PHENOL 1.4 % MT LIQD: 1.0000 | OROMUCOSAL | Status: DC | PRN

## 2021-11-21 MED ORDER — CEFAZOLIN SODIUM-DEXTROSE 2-4 GM/100ML-% IV SOLN
2.0000 g | INTRAVENOUS | Status: AC
  Administered 2021-11-21: 2 g via INTRAVENOUS
  Filled 2021-11-21: qty 100

## 2021-11-21 MED ORDER — PROMETHAZINE HCL 25 MG/ML IJ SOLN: 6.2500 mg | INTRAMUSCULAR | Status: DC | PRN

## 2021-11-21 MED ORDER — TRAZODONE HCL 150 MG PO TABS
150.0000 mg | ORAL_TABLET | Freq: Every day | ORAL | Status: DC
  Administered 2021-11-21: 150 mg via ORAL
  Filled 2021-11-21: qty 1

## 2021-11-21 MED ORDER — HEMOSTATIC AGENTS (NO CHARGE) OPTIME
TOPICAL | Status: DC | PRN
  Administered 2021-11-21: 1 via TOPICAL

## 2021-11-21 MED ORDER — LABETALOL HCL 5 MG/ML IV SOLN: 10.0000 mg | INTRAVENOUS | Status: DC | PRN

## 2021-11-21 MED ORDER — HEPARIN 6000 UNIT IRRIGATION SOLUTION
Status: DC | PRN
  Administered 2021-11-21: 1

## 2021-11-21 MED ORDER — PHENYLEPHRINE 80 MCG/ML (10ML) SYRINGE FOR IV PUSH (FOR BLOOD PRESSURE SUPPORT)
PREFILLED_SYRINGE | INTRAVENOUS | Status: AC
  Filled 2021-11-21: qty 10

## 2021-11-21 MED ORDER — PANTOPRAZOLE SODIUM 40 MG PO TBEC
40.0000 mg | DELAYED_RELEASE_TABLET | Freq: Every day | ORAL | Status: DC
  Administered 2021-11-21 – 2021-11-22 (×2): 40 mg via ORAL
  Filled 2021-11-21 (×3): qty 1

## 2021-11-21 MED ORDER — ESCITALOPRAM OXALATE 20 MG PO TABS
20.0000 mg | ORAL_TABLET | Freq: Every day | ORAL | Status: DC
  Administered 2021-11-22: 20 mg via ORAL
  Filled 2021-11-21: qty 1

## 2021-11-21 MED ORDER — ONDANSETRON HCL 4 MG/2ML IJ SOLN
INTRAMUSCULAR | Status: AC
  Filled 2021-11-21: qty 2

## 2021-11-21 MED ORDER — MORPHINE SULFATE (PF) 2 MG/ML IV SOLN: 2.0000 mg | INTRAVENOUS | Status: DC | PRN

## 2021-11-21 MED ORDER — FENTANYL CITRATE (PF) 250 MCG/5ML IJ SOLN
INTRAMUSCULAR | Status: DC | PRN
  Administered 2021-11-21: 100 ug via INTRAVENOUS

## 2021-11-21 MED ORDER — EPHEDRINE 5 MG/ML INJ
INTRAVENOUS | Status: AC
  Filled 2021-11-21: qty 5

## 2021-11-21 SURGICAL SUPPLY — 49 items
BAG BANDED W/RUBBER/TAPE 36X54 (MISCELLANEOUS) ×2 IMPLANT
BAG COUNTER SPONGE SURGICOUNT (BAG) ×2 IMPLANT
BALLN STERLING RX 5X40X80 (BALLOONS) ×2
BALLOON STERLING RX 5X40X80 (BALLOONS) IMPLANT
CANISTER SUCT 3000ML PPV (MISCELLANEOUS) ×2 IMPLANT
CLIP LIGATING EXTRA MED SLVR (CLIP) ×2 IMPLANT
CLIP LIGATING EXTRA SM BLUE (MISCELLANEOUS) ×2 IMPLANT
COVER DOME SNAP 22 D (MISCELLANEOUS) ×2 IMPLANT
COVER PROBE W GEL 5X96 (DRAPES) ×2 IMPLANT
DERMABOND ADVANCED .7 DNX12 (GAUZE/BANDAGES/DRESSINGS) ×2 IMPLANT
DRAPE C-ARM 42X72 X-RAY (DRAPES) IMPLANT
DRAPE FEMORAL ANGIO 80X135IN (DRAPES) ×2 IMPLANT
ELECT REM PT RETURN 9FT ADLT (ELECTROSURGICAL) ×2
ELECTRODE REM PT RTRN 9FT ADLT (ELECTROSURGICAL) ×2 IMPLANT
GLOVE BIO SURGEON STRL SZ7.5 (GLOVE) ×2 IMPLANT
GOWN STRL REUS W/ TWL LRG LVL3 (GOWN DISPOSABLE) ×4 IMPLANT
GOWN STRL REUS W/ TWL XL LVL3 (GOWN DISPOSABLE) ×2 IMPLANT
GOWN STRL REUS W/TWL LRG LVL3 (GOWN DISPOSABLE) ×4
GOWN STRL REUS W/TWL XL LVL3 (GOWN DISPOSABLE) ×2
GUIDEWIRE ENROUTE 0.014 (WIRE) ×2 IMPLANT
INSERT FOGARTY SM (MISCELLANEOUS) IMPLANT
KIT BASIN OR (CUSTOM PROCEDURE TRAY) ×2 IMPLANT
KIT ENCORE 26 ADVANTAGE (KITS) ×2 IMPLANT
KIT TURNOVER KIT B (KITS) ×2 IMPLANT
NDL HYPO 25GX1X1/2 BEV (NEEDLE) IMPLANT
NEEDLE HYPO 25GX1X1/2 BEV (NEEDLE) IMPLANT
PACK CAROTID (CUSTOM PROCEDURE TRAY) ×2 IMPLANT
POSITIONER HEAD DONUT 9IN (MISCELLANEOUS) ×2 IMPLANT
POWDER SURGICEL 3.0 GRAM (HEMOSTASIS) IMPLANT
PROTECTION STATION PRESSURIZED (MISCELLANEOUS)
SET MICROPUNCTURE 5F STIFF (MISCELLANEOUS) IMPLANT
SHEATH AVANTI 11CM 5FR (SHEATH) IMPLANT
STATION PROTECTION PRESSURIZED (MISCELLANEOUS) IMPLANT
STENT TRANSCAROTID SYS 7X40 (Permanent Stent) IMPLANT
STENT TRANSCAROTID SYSTEM 9X30 (Permanent Stent) IMPLANT
SUT MNCRL AB 4-0 PS2 18 (SUTURE) ×2 IMPLANT
SUT PROLENE 5 0 C 1 24 (SUTURE) ×2 IMPLANT
SUT PROLENE 6 0 BV (SUTURE) IMPLANT
SUT SILK 2 0 PERMA HAND 18 BK (SUTURE) ×2 IMPLANT
SUT VIC AB 3-0 SH 27 (SUTURE) ×2
SUT VIC AB 3-0 SH 27X BRD (SUTURE) ×2 IMPLANT
SYR 10ML LL (SYRINGE) ×6 IMPLANT
SYR 20ML LL LF (SYRINGE) ×2 IMPLANT
SYR CONTROL 10ML LL (SYRINGE) IMPLANT
SYSTEM TRANSCAROTID NEUROPRTCT (MISCELLANEOUS) ×2 IMPLANT
TOWEL GREEN STERILE (TOWEL DISPOSABLE) ×2 IMPLANT
TRANSCAROTID NEUROPROTECT SYS (MISCELLANEOUS) ×2
WATER STERILE IRR 1000ML POUR (IV SOLUTION) ×2 IMPLANT
WIRE BENTSON .035X145CM (WIRE) ×2 IMPLANT

## 2021-11-21 NOTE — Anesthesia Procedure Notes (Signed)
Procedure Name: Intubation Date/Time: 11/21/2021 7:40 AM  Performed by: Heide Scales, CRNAPre-anesthesia Checklist: Patient identified, Emergency Drugs available, Suction available and Patient being monitored Patient Re-evaluated:Patient Re-evaluated prior to induction Oxygen Delivery Method: Circle system utilized Preoxygenation: Pre-oxygenation with 100% oxygen Induction Type: IV induction Ventilation: Mask ventilation without difficulty Laryngoscope Size: Mac and 4 Grade View: Grade I Tube type: Oral Tube size: 7.5 mm Number of attempts: 1 Airway Equipment and Method: Stylet and Oral airway Placement Confirmation: ETT inserted through vocal cords under direct vision, positive ETCO2 and breath sounds checked- equal and bilateral Secured at: 23 cm Tube secured with: Tape Dental Injury: Teeth and Oropharynx as per pre-operative assessment

## 2021-11-21 NOTE — Plan of Care (Signed)

## 2021-11-21 NOTE — H&P (Signed)
HPI:   Colin Villa is a 81 y.o. male with a history of high-grade left ICA stenosis with occluded right ICA.  He is asymptomatic from the left ICA stenosis.  Does not have a history of any symptoms of stroke, TIA or amaurosis.  He is on aspirin Plavix and a statin.  He does have a known 70% stenosis of the left ICA in the past we have discussed stenting for which they present for further discussion today.  He has had some issues with memory but otherwise in his usual state of health.  Patient is blind.        Past Medical History:  Diagnosis Date   ADHD     Blind in both eyes     BPH (benign prostatic hyperplasia)     Carotid artery occlusion     Colonic polyp     Depression     Elevated fasting glucose     Elevated PSA     Essential hypertension, benign     Hypertension     Kidney stones     Mixed dyslipidemia     Osteoarthritis      Especially of the left kneee which end stage          Family History  Problem Relation Age of Onset   Heart failure Mother     Melanoma Mother     Mental illness Mother     Heart failure Father     Cancer Brother          bladder   Diabetes Brother           Past Surgical History:  Procedure Laterality Date   COLONOSCOPY       CORONARY ANGIOGRAPHY   1998   KNEE SURGERY   1959    medial collateral ligament   LEFT HEART CATH AND CORONARY ANGIOGRAPHY N/A 08/10/2016    Procedure: LEFT HEART CATH AND CORONARY ANGIOGRAPHY;  Surgeon: Troy Sine, MD;  Location: [place] [facility];  Service: Cardiovascular;  Laterality: N/A;   LITHOTRIPSY   [place] Left 2010   THORACIC AORTOGRAM N/A 08/10/2016    Procedure: Thoracic Aortogram;  Surgeon: Troy Sine, MD;  Location: [place] [facility];  Service: Cardiovascular;  Laterality: N/A;      Short Social History:  Social History         Tobacco Use   Smoking status: Former      Types: Cigarettes      Quit date: 01/27/1996      Years since quitting: 25.7   Smokeless  tobacco: Never  Substance Use Topics   Alcohol use: Yes      Comment: 4 beers weekly           Allergies  Allergen Reactions   Dextromethorphan-Quinidine        Other reaction(s): Dizziness (intolerance)   Sulfa Antibiotics Other (See Comments)      Childhood allergy            Current Outpatient Medications  Medication Sig Dispense Refill   ARIPiprazole (ABILIFY) 10 MG tablet Take 10 mg by mouth daily.       Ascorbic Acid (VITAMIN C) 1000 MG tablet Take 1,000 mg by mouth daily.       aspirin 81 MG EC tablet Take 162 mg by mouth daily.       atropine 1 % ophthalmic solution Place 1 drop into both eyes at bedtime.  B Complex Vitamins (VITAMIN-B COMPLEX PO) Take 1 capsule by mouth daily.       clopidogrel (PLAVIX) 75 MG tablet TAKE 1 TABLET(75 MG) BY MOUTH DAILY 30 tablet 6   escitalopram (LEXAPRO) 20 MG tablet Take 20 mg by mouth daily.       FERROUS SULFATE PO Take 65 mg by mouth daily.       lamoTRIgine (LAMICTAL) 100 MG tablet         prednisoLONE acetate (PRED FORTE) 1 % ophthalmic suspension Place 1 drop into both eyes See admin instructions. Morning and bedtime. May use 2 extra times during the day if needed       ramipril (ALTACE) 2.5 MG capsule Take 2.5 mg by mouth daily.       rosuvastatin (CRESTOR) 40 MG tablet Take 1 tablet (40 mg total) by mouth daily. Please make overdue appt with Dr. Marlou Porch before anymore refills. 1st attempt (Patient taking differently: Take 40 mg by mouth at bedtime.) 30 tablet 0   tamsulosin (FLOMAX) 0.4 MG CAPS capsule Take 0.4 mg by mouth at bedtime.   11   traZODone (DESYREL) 150 MG tablet Take 150 mg by mouth at bedtime.       valACYclovir (VALTREX) 1000 MG tablet Take 1,000 mg by mouth daily.        No current facility-administered medications for this visit.      Review of Systems  Constitutional:  Constitutional negative. HENT: HENT negative.  Eyes: Positive for loss of vision.   Respiratory: Respiratory negative.  Cardiovascular:  Cardiovascular negative.  GI: Gastrointestinal negative.  Musculoskeletal: Musculoskeletal negative.  Skin: Skin negative.  Neurological: Neurological negative. Hematologic: Hematologic/lymphatic negative.  Psychiatric: Psychiatric negative.          Objective:    Vitals:   11/21/21 0550  BP: 129/63  Pulse: 60  Resp: 17  Temp: 97.7 F (36.5 C)  SpO2: 99%    Physical Exam HENT:     Head: Normocephalic.     Nose: Nose normal.  Eyes:     Pupils: Pupils are equal, round, and reactive to light.  Cardiovascular:     Pulses: Normal pulses.  Pulmonary:     Effort: Pulmonary effort is normal.  Abdominal:     General: Abdomen is flat.     Palpations: Abdomen is soft.  Musculoskeletal:        General: Normal range of motion.     Cervical back: Normal range of motion and neck supple.     Right lower leg: No edema.     Left lower leg: No edema.  Skin:    General: Skin is warm.     Capillary Refill: Capillary refill takes less than 2 seconds.  Neurological:     General: No focal deficit present.     Mental Status: He is alert.  Psychiatric:        Mood and Affect: Mood normal.        Data: CTA Neck IMPRESSION: 1. Occlusion of the right internal carotid artery just distal to its origin without distal reconstitution. 2. Approximately 70% stenosis of the proximal left internal carotid artery due to mixed density plaque. 3. 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    Assessment/Plan:   81 year old male with asymptomatic left ICA stenosis.  He is on aspirin, Plavix and statin.  We discussed proceeding with left transcarotid artery stenting which he demonstrates good understanding of the risk and benefits.       Saul Fabiano C.  Donzetta Matters, MD [facility] Office: [phone] Pager: [phone]

## 2021-11-21 NOTE — Transfer of Care (Signed)
Immediate Anesthesia Transfer of Care Note  Patient: Colin Villa  Procedure(s) Performed: Left Transcarotid Artery Revascularization (Left: Neck) ULTRASOUND GUIDANCE FOR VASCULAR ACCESS (Right: Groin)  Patient Location: PACU  Anesthesia Type:General  Level of Consciousness: awake, alert , and oriented  Airway & Oxygen Therapy: Patient Spontanous Breathing and Patient connected to face mask oxygen  Post-op Assessment: Report given to RN, Post -op Vital signs reviewed and stable, and Patient moving all extremities X 4  Post vital signs: Reviewed and stable  Last Vitals:  Vitals Value Taken Time  BP 161/60   Temp    Pulse 107 11/21/21 0941  Resp 22 11/21/21 0941  SpO2 91 % 11/21/21 0941  Vitals shown include unvalidated device data.  Last Pain:  Vitals:   11/21/21 0610  TempSrc:   PainSc: 0-No pain         Complications: No notable events documented.

## 2021-11-21 NOTE — Progress Notes (Signed)
Pt arrived from ..pacu..., A/ox .4..pt denies any pain, MD aware,CCMD called. CHG bath given,no further needs at this time   

## 2021-11-21 NOTE — Anesthesia Postprocedure Evaluation (Signed)
Anesthesia Post Note  Patient: Colin Villa  Procedure(s) Performed: Left Transcarotid Artery Revascularization (Left: Neck) ULTRASOUND GUIDANCE FOR VASCULAR ACCESS (Right: Groin)     Patient location during evaluation: PACU Anesthesia Type: General Level of consciousness: sedated Pain management: pain level controlled Vital Signs Assessment: post-procedure vital signs reviewed and stable Respiratory status: spontaneous breathing and respiratory function stable Cardiovascular status: stable Postop Assessment: no apparent nausea or vomiting Anesthetic complications: no   No notable events documented.  Last Vitals:  Vitals:   11/21/21 1115 11/21/21 1130  BP:  129/69  Pulse: 69 69  Resp: 12 16  Temp:    SpO2: 100% 99%    Last Pain:  Vitals:   11/21/21 1130  TempSrc:   PainSc: 0-No pain                 Hanzel Pizzo DANIEL

## 2021-11-21 NOTE — Anesthesia Procedure Notes (Signed)
Arterial Line Insertion Start/End11/14/2023 7:05 AM, 11/21/2021 7:10 AM Performed by: Heide Scales, CRNA, CRNA  Patient location: OR. Preanesthetic checklist: patient identified, IV checked, site marked, risks and benefits discussed, surgical consent, monitors and equipment checked, pre-op evaluation, timeout performed and anesthesia consent Lidocaine 1% used for infiltration Right, radial was placed Catheter size: 20 G Hand hygiene performed , maximum sterile barriers used  and Seldinger technique used Allen's test indicative of satisfactory collateral circulation Attempts: 1 Procedure performed without using ultrasound guided technique. Following insertion, dressing applied. Post procedure assessment: normal and unchanged  Patient tolerated the procedure well with no immediate complications.

## 2021-11-21 NOTE — Op Note (Addendum)
Patient name: MEHAR KIRKWOOD MRN: [id number] DOB: April 10, 1940 Sex: male  11/21/2021 Pre-operative Diagnosis: Asymptomatic left ICA stenosis Post-operative diagnosis:  Same Surgeon:  Erlene Quan C. Donzetta Matters, MD Assistant: Curt Jews, MD Procedure Performed: 1.  Ultrasound-guided cannulation of right common femoral vein for placement of flow reversal sheath 2.  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  He has followed up in our office with CT scan and duplex and has been on dual antiplatelet therapy and is now indicated for transcarotid artery stenting on the left.  Findings: There were 2 areas of stenosis 1 high-grade in the distal ICA and more proximally there was at least 50% stenosis.  After stenting these were reduced to less than 20% stenosis.   Procedure:  The patient was identified in the holding area and taken to the operating room where he was placed upon operative when general anesthesia was induced.  He was sterilely prepped and draped in the left neck bilateral groins in usual fashion, antibiotics were minister timeout was called.  We began using ultrasound guidance to cannulate the right common femoral vein with micropuncture needle followed by wire and a sheath.  A Bentson wire was placed followed by flow reversal sheath and this was affixed to the skin with silk suture.  Longitudinal incision was then made between the 2 heads of the sternocleidomastoid.  We dissected down to the common carotid artery encircled this proximally with vessel loop in a Potts configuration as well as umbilical tape and the patient was fully heparinized.  We placed a U stitch in the common carotid artery  5-0 Prolene suture and then cannulated the common carotid artery between the U stitch with micropuncture needle followed by wire to 3 cm in 2 to 3 cm and performed angiography.  We then placed a J-wire Amplatz to just below the level of stenosis and then placed the flow reversal sheath and this was affixed to the skin in 2 areas and flow reversal was established.  TCAR timeout was performed and ACT was over 300.  The common carotid artery was clamped and flow reversal was confirmed.  We then crossed the stenosed areas using 014 wire primarily ballooned with a 5 mm balloon and then primarily stented distally with a 7 x 40 mm stent and approximately extended down with 9 x 30 mm stent.  We allowed 2 minutes of washout.  We then performed a completion angiography in 2 views which demonstrated minimal residual stenosis.  The wire and catheters were removed.  Carotid was unclamped and blood was returned via the right groin sheath.  We then removed the sheath in the common carotid artery and the U stitch was cinched did require a second 6-0 Prolene suture for hemostasis.  Doppler confirmed strong flow in the common carotid artery and 50 mg of protamine was administered which he tolerated well.  Sheath was removed and the right groin pressure held for hemostasis obtained.  We obtain hemostasis and washed out the left neck incision and then closed the platysma with Vicryl and the skin with Monocryl.  Dermabond was placed at the skin level.  Patient was awake from anesthesia and noted to be neurologically intact transferred to the recovery  area in stable condition.  All counts were correct at completion.    Given the complexity of the case,  the assistant was necessary in order to expedient the procedure and safely perform the technical aspects of the operation.  The assistant provided traction and countertraction to assist with exposure of the common carotid artery and to facilitate exchange of wire, balloons and  stent.    EBL: 50 cc  Contrast: 25 cc  Amram Maya C. Donzetta Matters, MD [facility] [place] Office: [phone] Pager: [phone]

## 2021-11-22 ENCOUNTER — Encounter (HOSPITAL_COMMUNITY): Payer: Self-pay | Admitting: Vascular Surgery

## 2021-11-22 LAB — BASIC METABOLIC PANEL
Anion gap: 10 (ref 5–15)
BUN: 19 mg/dL (ref 8–23)
CO2: 23 mmol/L (ref 22–32)
Calcium: 8.9 mg/dL (ref 8.9–10.3)
Chloride: 105 mmol/L (ref 98–111)
Creatinine, Ser: 1.3 mg/dL — ABNORMAL HIGH (ref 0.61–1.24)
GFR, Estimated: 56 mL/min — ABNORMAL LOW (ref >60)
Glucose, Bld: 165 mg/dL — ABNORMAL HIGH (ref 70–99)
Potassium: 4.1 mmol/L (ref 3.5–5.1)
Sodium: 138 mmol/L (ref 135–145)

## 2021-11-22 LAB — CBC
HCT: 32.5 % — ABNORMAL LOW (ref 39.0–52.0)
Hemoglobin: 11.7 g/dL — ABNORMAL LOW (ref 13.0–17.0)
MCH: 36.9 pg — ABNORMAL HIGH (ref 26.0–34.0)
MCHC: 36 g/dL (ref 30.0–36.0)
MCV: 102.5 fL — ABNORMAL HIGH (ref 80.0–100.0)
Platelets: 130 10*3/uL — ABNORMAL LOW (ref 150–400)
RBC: 3.17 MIL/uL — ABNORMAL LOW (ref 4.22–5.81)
RDW: 13.8 % (ref 11.5–15.5)
WBC: 13.9 10*3/uL — ABNORMAL HIGH (ref 4.0–10.5)
nRBC: 0 % (ref 0.0–0.2)

## 2021-11-22 LAB — POCT I-STAT 7, (LYTES, BLD GAS, ICA,H+H)
Acid-Base Excess: 0 mmol/L (ref 0.0–2.0)
Bicarbonate: 22.6 mmol/L (ref 20.0–28.0)
Calcium, Ion: 1.18 mmol/L (ref 1.15–1.40)
HCT: 30 % — ABNORMAL LOW (ref 39.0–52.0)
Hemoglobin: 10.2 g/dL — ABNORMAL LOW (ref 13.0–17.0)
O2 Saturation: 100 %
Potassium: 4 mmol/L (ref 3.5–5.1)
Sodium: 137 mmol/L (ref 135–145)
TCO2: 23 mmol/L (ref 22–32)
pCO2 arterial: 28.6 mmHg — ABNORMAL LOW (ref 32–48)
pH, Arterial: 7.506 — ABNORMAL HIGH (ref 7.35–7.45)
pO2, Arterial: 314 mmHg — ABNORMAL HIGH (ref 83–108)

## 2021-11-22 LAB — POCT ACTIVATED CLOTTING TIME: Activated Clotting Time: 371 seconds

## 2021-11-22 MED ORDER — OXYCODONE-ACETAMINOPHEN 5-325 MG PO TABS: 1.0000 | ORAL_TABLET | Freq: Four times a day (QID) | ORAL | 0 refills | Status: DC | PRN

## 2021-11-22 NOTE — Progress Notes (Signed)
Discharge instructions reviewed with pt and family.  IV and telemetry removed.  CCMD notified.  Pt discharged home with daughter and spouse.

## 2021-11-22 NOTE — Discharge Instructions (Signed)
   [facility] [place]  Discharge Instructions   Carotid Endarterectomy (CEA)  Please refer to the following instructions for your post-procedure care. Your surgeon or physician assistant will discuss any changes with you.  Activity  You are encouraged to walk as much as you can. You can slowly return to normal activities but must avoid strenuous activity and heavy lifting until your doctor tell you it's OK. Avoid activities such as vacuuming or swinging a golf club. You can drive after one week if you are comfortable and you are no longer taking prescription pain medications. It is normal to feel tired for serval weeks after your surgery. It is also normal to have difficulty with sleep habits, eating, and bowel movements after surgery. These will go away with time.  Bathing/Showering  You may shower after you come home. Do not soak in a bathtub, hot tub, or swim until the incision heals completely.  Incision Care  Shower every day. Clean your incision with mild soap and water. Pat the area dry with a clean towel. You do not need a bandage unless otherwise instructed. Do not apply any ointments or creams to your incision. You may have skin glue on your incision. Do not peel it off. It will come off on its own in about one week. Your incision may feel thickened and raised for several weeks after your surgery. This is normal and the skin will soften over time. For Men Only: It's OK to shave around the incision but do not shave the incision itself for 2 weeks. It is common to have numbness under your chin that could last for several months.  Diet  Resume your normal diet. There are no special food restrictions following this procedure. A low fat/low cholesterol diet is recommended for all patients with vascular disease. In order to heal from your surgery, it is CRITICAL to get adequate nutrition. Your body requires vitamins, minerals, and protein. Vegetables are the best  source of vitamins and minerals. Vegetables also provide the perfect balance of protein. Processed food has little nutritional value, so try to avoid this.        Medications  Resume taking all of your medications unless your doctor or physician assistant tells you not to. If your incision is causing pain, you may take over-the- counter pain relievers such as acetaminophen (Tylenol). If you were prescribed a stronger pain medication, please be aware these medications can cause nausea and constipation. Prevent nausea by taking the medication with a snack or meal. Avoid constipation by drinking plenty of fluids and eating foods with a high amount of fiber, such as fruits, vegetables, and grains. Do not take Tylenol if you are taking prescription pain medications.  Follow Up  Our office will schedule a follow up appointment 2-3 weeks following discharge.  Please call us immediately for any of the following conditions  Increased pain, redness, drainage (pus) from your incision site. Fever of 101 degrees or higher. If you should develop stroke (slurred speech, difficulty swallowing, weakness on one side of your body, loss of vision) you should call 911 and go to the nearest emergency room.  Reduce your risk of vascular disease:  Stop smoking. If you would like help call [facility] at 1-800-QUIT-NOW (1-800-784-8669) or [place] at [phone]. Manage your cholesterol Maintain a desired weight Control your diabetes Keep your blood pressure down  If you have any questions, please call the office at [phone].   

## 2021-11-22 NOTE — TOC Transition Note (Signed)
Transition of Care (TOC) - CM/SW Discharge Note Marvetta Gibbons RN, BSN Transitions of Care Unit 4E- RN Case Manager See Treatment Team for direct phone #   Patient Details  Name: Colin Villa MRN: [id number] Date of Birth: October 17, 1940  [facility] CM/SW Contact:  Dawayne Patricia, RN Phone Number: 11/22/2021, 11:19 AM   Clinical Narrative:    Pt s/p TCAR, stable for transition home today. CM Notified by [facility] - they are following patient with MD office protocol referral prearranged for [facility] needs. Lattie Haw w/ [place] notified of discharge for start of care.  No further TOC needs noted.  Family to transport home   Final next level of care: [place] Barriers to Discharge: No Barriers Identified   Patient Goals and CMS Choice Patient states their goals for this hospitalization and ongoing recovery are:: plan to return home      Discharge Placement                 Home w/ [facility]      Discharge Plan and Services                DME Arranged: N/A DME Agency: NA       HH Arranged:  (office protocol) [place] Agency: [place] Date [place]: 11/22/21 Time HH Agency Contacted: 1000 Representative spoke with at [place]: [place] ([place]) Interventions     Readmission Risk Interventions    11/22/2021   11:19 AM  Readmission Risk Prevention Plan  Post Dischage Appt Complete  Medication Screening Complete  Transportation Screening Complete

## 2021-11-22 NOTE — Plan of Care (Signed)
  Problem: Education: Goal: Understanding of CV disease, CV risk reduction, and recovery process will improve Outcome: Adequate for Discharge Goal: Individualized Educational Video(s) Outcome: Adequate for Discharge   Problem: Activity: Goal: Ability to return to baseline activity level will improve Outcome: Adequate for Discharge   Problem: Cardiovascular: Goal: Ability to achieve and maintain adequate cardiovascular perfusion will improve Outcome: Adequate for Discharge   Problem: Health Behavior/Discharge Planning: Goal: Ability to safely manage health-related needs after discharge will improve Outcome: Adequate for Discharge

## 2021-11-22 NOTE — Progress Notes (Signed)
  Progress Note    11/22/2021 8:14 AM 1 Day Post-Op  Subjective: No new complaints  Vitals:   11/21/21 2342 11/22/21 0453  BP: 131/60 (!) 114/54  Pulse: 78 98  Resp: 20 20  Temp: 98.3 F (36.8 C) 98.4 F (36.9 C)  SpO2: 100% 94%    Physical Exam: Awake alert and oriented at neurologic baseline Left neck hematoma is minimal today  CBC    Component Value Date/Time   WBC 13.9 (H) 11/22/2021 0119   RBC 3.17 (L) 11/22/2021 0119   HGB 11.7 (L) 11/22/2021 0119   HCT 32.5 (L) 11/22/2021 0119   PLT 130 (L) 11/22/2021 0119   MCV 102.5 (H) 11/22/2021 0119   MCH 36.9 (H) 11/22/2021 0119   MCHC 36.0 11/22/2021 0119   RDW 13.8 11/22/2021 0119   LYMPHSABS 0.9 09/12/2020 0225   MONOABS 0.4 09/12/2020 0225   EOSABS 0.3 09/12/2020 0225   BASOSABS 0.0 09/12/2020 0225    BMET    Component Value Date/Time   NA 138 11/22/2021 0119   K 4.1 11/22/2021 0119   CL 105 11/22/2021 0119   CO2 23 11/22/2021 0119   GLUCOSE 165 (H) 11/22/2021 0119   BUN 19 11/22/2021 0119   CREATININE 1.30 (H) 11/22/2021 0119   CALCIUM 8.9 11/22/2021 0119   GFRNONAA 56 (L) 11/22/2021 0119   GFRAA >60 08/10/2016 1202    INR    Component Value Date/Time   INR 1.2 11/13/2021 1422     Intake/Output Summary (Last 24 hours) at 11/22/2021 0814 Last data filed at 11/22/2021 0500 Gross per 24 hour  Intake 2118.5 ml  Output 475 ml  Net 1643.5 ml     Assessment:  81 y.o. male is s/p left TCAR without complication 1 Day Post-Op  Plan: Okay for DC today with aspirin, Plavix and statin   Colin Villa C. Donzetta Matters, MD [facility] [place] Office: [phone] Pager: [phone]  11/22/2021 8:14 AM

## 2021-11-24 ENCOUNTER — Telehealth: Payer: Self-pay

## 2021-11-24 ENCOUNTER — Telehealth: Payer: Self-pay | Admitting: Physician Assistant

## 2021-11-24 DIAGNOSIS — E782 Mixed hyperlipidemia: Secondary | ICD-10-CM | POA: Diagnosis not present

## 2021-11-24 DIAGNOSIS — Z7982 Long term (current) use of aspirin: Secondary | ICD-10-CM | POA: Diagnosis not present

## 2021-11-24 DIAGNOSIS — F411 Generalized anxiety disorder: Secondary | ICD-10-CM | POA: Diagnosis not present

## 2021-11-24 DIAGNOSIS — H548 Legal blindness, as defined in USA: Secondary | ICD-10-CM | POA: Diagnosis not present

## 2021-11-24 DIAGNOSIS — F32A Depression, unspecified: Secondary | ICD-10-CM | POA: Diagnosis not present

## 2021-11-24 DIAGNOSIS — Z7902 Long term (current) use of antithrombotics/antiplatelets: Secondary | ICD-10-CM | POA: Diagnosis not present

## 2021-11-24 DIAGNOSIS — I2510 Atherosclerotic heart disease of native coronary artery without angina pectoris: Secondary | ICD-10-CM | POA: Diagnosis not present

## 2021-11-24 DIAGNOSIS — Z48812 Encounter for surgical aftercare following surgery on the circulatory system: Secondary | ICD-10-CM | POA: Diagnosis not present

## 2021-11-24 DIAGNOSIS — I10 Essential (primary) hypertension: Secondary | ICD-10-CM | POA: Diagnosis not present

## 2021-11-24 DIAGNOSIS — M1712 Unilateral primary osteoarthritis, left knee: Secondary | ICD-10-CM | POA: Diagnosis not present

## 2021-11-24 DIAGNOSIS — C159 Malignant neoplasm of esophagus, unspecified: Secondary | ICD-10-CM | POA: Diagnosis not present

## 2021-11-24 DIAGNOSIS — F988 Other specified behavioral and emotional disorders with onset usually occurring in childhood and adolescence: Secondary | ICD-10-CM | POA: Diagnosis not present

## 2021-11-24 DIAGNOSIS — N400 Enlarged prostate without lower urinary tract symptoms: Secondary | ICD-10-CM | POA: Diagnosis not present

## 2021-11-24 NOTE — Telephone Encounter (Signed)
Magda Paganini RN with [facility] called stating that the pt would be out of town next week for Thanksgiving so he would miss a couple of days of home visits. She also reported that the pt was experiencing some nausea and had requested some Zofran.  Reviewed pt's chart, called pt for clarification, two identifiers used. Spoke with pt's daughter, Lambert Keto, (on [place]), and asked if the pt was having nausea d/t to the pain meds. She was unsure, but gave her the advice to eat something small and then take the pain medicine to see if that would stop the nausea. If not, she was instructed to call the office, even after hours, to get some nausea meds. Confirmed understanding.

## 2021-11-24 NOTE — Telephone Encounter (Signed)
-----   Message from Dagoberto Ligas, PA-C sent at 11/22/2021  7:38 AM EST -----  Carotid duplex in 3-4 weeks with PA on [place] office day.  PO L TCAR Thanks

## 2021-11-25 NOTE — Discharge Summary (Signed)
Discharge Summary     Colin Villa Jan 06, 1941 81 y.o. male  [id number]  Admission Date: 11/21/2021  Discharge Date: 11/22/21  Physician: Dr. Donzetta Matters  Admission Diagnosis: Carotid artery stenosis [I65.29]  Discharge Day services:   See progress note 11/22/2021  Hospital Course:  Mr. Colin Villa is an 81 year old male who underwent left sided TCAR due to asymptomatic high-grade left ICA stenosis.  He tolerated the procedure well and was admitted to the hospital postoperatively.  POD #1 his neuro exam remained at baseline and he was ready for discharge home.  He will follow-up in the office in a few weeks for incision and carotid duplex check.  He will need to continue his aspirin, Plavix, statin regimen daily.  He will be prescribed narcotic pain medication for continued postoperative pain control.  Discharge instructions were reviewed with the patient and he voices understanding.  He was discharged home in stable condition.   No results for input(s): "NA", "K", "CL", "CO2", "GLUCOSE", "BUN", "CALCIUM" in the last 72 hours.  Invalid input(s): "CRATININE" No results for input(s): "WBC", "HGB", "HCT", "PLT" in the last 72 hours. No results for input(s): "INR" in the last 72 hours.     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   GAD (generalized anxiety disorder) 11/08/2017   ADD (attention deficit disorder) 11/08/2017   Coronary artery disease involving native coronary artery of native heart without angina pectoris 09/05/2016   Essential hypertension 09/05/2016   Hyperlipidemia 09/05/2016   Exertional dyspnea    Osteoarthritis    Past Medical History:  Diagnosis Date   ADHD    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 Wonda Amis    Allergies as of 11/22/2021       Reactions   Dextromethorphan-quinidine    Other reaction(s): Dizziness (intolerance)   Sulfa Antibiotics Other (See Comments)   Childhood allergy        Medication List     TAKE these medications    acetaminophen 500 MG tablet Commonly known as: TYLENOL Take 500 mg by mouth every 6 (six) hours as needed for mild pain or moderate pain.   aspirin EC 81 MG tablet Take 162 mg by mouth daily.   atropine 1 % ophthalmic solution Place 1 drop into both eyes at bedtime.   CeraVe Itch Relief 1 % Lotn Generic drug: pramoxine Apply 1 Application topically daily. On face and head   clopidogrel 75 MG tablet Commonly known as: PLAVIX TAKE 1 TABLET(75 MG) BY MOUTH DAILY   Dandruff Shampoo 1 % shampoo Generic drug: pyrithione zinc Apply 1 Application topically every 3 (three) days.  diphenhydrAMINE 25 MG tablet Commonly known as: BENADRYL Take 25-50 mg by mouth daily as needed for allergies.   escitalopram 20 MG tablet Commonly known as: LEXAPRO Take 20 mg by mouth daily.   FERROUS SULFATE PO Take 65 mg by mouth daily.   lamoTRIgine 100 MG tablet Commonly known as: LAMICTAL Take 100 mg by mouth at bedtime. Take with 25 mg for a total of 125 mg at bedtime   lamoTRIgine 25 MG tablet Commonly known as: LAMICTAL Take 25 mg by mouth See admin instructions. Take with 100 mg for a total of 125 mg at bedtime    oxyCODONE-acetaminophen 5-325 MG tablet Commonly known as: PERCOCET/ROXICET Take 1 tablet by mouth every 6 (six) hours as needed for moderate pain.   prednisoLONE acetate 1 % ophthalmic suspension Commonly known as: PRED FORTE Place 1 drop into both eyes 2 (two) times daily. Morning and bedtime. May use 2 extra times during the day if needed   ramipril 2.5 MG capsule Commonly known as: ALTACE Take 2.5 mg by mouth at bedtime.   rosuvastatin 40 MG tablet Commonly known as: CRESTOR Take 1 tablet (40 mg total) by mouth daily. Please make overdue appt with Dr. Marlou Porch before anymore refills. 1st attempt What changed:  when to take this additional instructions   traZODone 150 MG tablet Commonly known as: DESYREL Take 150 mg by mouth at bedtime.   valACYclovir 1000 MG tablet Commonly known as: VALTREX Take 1,000 mg by mouth daily.   vitamin C 1000 MG tablet Take 1,000 mg by mouth daily.   VITAMIN-B COMPLEX PO Take 1 capsule by mouth daily.         Discharge Instructions:   [facility] Discharge Instructions Carotid Endarterectomy (CEA)  Please refer to the following instructions for your post-procedure care. Your surgeon or physician assistant will discuss any changes with you.  Activity  You are encouraged to walk as much as you can. You can slowly return to normal activities but must avoid strenuous activity and heavy lifting until your doctor tell you it's OK. Avoid activities such as vacuuming or swinging a golf club. You can drive after one week if you are comfortable and you are no longer taking prescription pain medications. It is normal to feel tired for serval weeks after your surgery. It is also normal to have difficulty with sleep habits, eating, and bowel movements after surgery. These will go away with time.  Bathing/Showering  You may shower after you come home. Do not soak in a bathtub, hot tub, or swim until the incision  heals completely.  Incision Care  Shower every day. Clean your incision with mild soap and water. Pat the area dry with a clean towel. You do not need a bandage unless otherwise instructed. Do not apply any ointments or creams to your incision. You may have skin glue on your incision. Do not peel it off. It will come off on its own in about one week. Your incision may feel thickened and raised for several weeks after your surgery. This is normal and the skin will soften over time. For Men Only: It's OK to shave around the incision but do not shave the incision itself for 2 weeks. It is common to have numbness under your chin that could last for several months.  Diet  Resume your normal diet. There are no special food restrictions following this procedure. A low fat/low cholesterol diet is recommended for all patients with vascular  disease. In order to heal from your surgery, it is CRITICAL to get adequate nutrition. Your body requires vitamins, minerals, and protein. Vegetables are the best source of vitamins and minerals. Vegetables also provide the perfect balance of protein. Processed food has little nutritional value, so try to avoid this.  Medications  Resume taking all of your medications unless your doctor or physician assistant tells you not to.  If your incision is causing pain, you may take over-the- counter pain relievers such as acetaminophen (Tylenol). If you were prescribed a stronger pain medication, please be aware these medications can cause nausea and constipation.  Prevent nausea by taking the medication with a snack or meal. Avoid constipation by drinking plenty of fluids and eating foods with a high amount of fiber, such as fruits, vegetables, and grains. Do not take Tylenol if you are taking prescription pain medications.  Follow Up  Our office will schedule a follow up appointment 2-3 weeks following discharge.  Please call us immediately for any of the following  conditions  Increased pain, redness, drainage (pus) from your incision site. Fever of 101 degrees or higher. If you should develop stroke (slurred speech, difficulty swallowing, weakness on one side of your body, loss of vision) you should call 911 and go to the nearest emergency room.  Reduce your risk of vascular disease:  Stop smoking. If you would like help call [facility] at 1-800-QUIT-NOW 951-013-4701) or [place] at [phone]. Manage your cholesterol Maintain a desired weight Control your diabetes Keep your blood pressure down  If you have any questions, please call the office at [phone].  Disposition: home  Patient's condition: is Good  Follow up: 1. Dr. VVS in 2 weeks.   Dagoberto Ligas, PA-C [facility] [phone]   --- For [facility] Registry use ---   Modified Rankin score at D/C (0-6): 0  IV medication needed for:  1. Hypertension: No 2. Hypotension: No  Post-op Complications: No  1. Post-op CVA or TIA: No  If yes: Event classification (right eye, left eye, right cortical, left cortical, verterobasilar, other):   If yes: Timing of event (intra-op, <6 hrs post-op, >=6 hrs post-op, unknown):   2. CN injury: No  If yes: CN  injuried   3. Myocardial infarction: No  If yes: Dx by (EKG or clinical, Troponin):   4.  CHF: No  5.  Dysrhythmia (new): No  6. Wound infection: No  7. Reperfusion symptoms: No  8. Return to OR: No  If yes: return to OR for (bleeding, neurologic, other CEA incision, other):   Discharge medications: Statin use:  Yes ASA use:  Yes   Beta blocker use:  No ACE-Inhibitor use:  Yes  ARB use:  No CCB use: No P2Y12 Antagonist use: Yes, [ x] Plavix, '[ ]'$  Plasugrel, '[ ]'$  Ticlopinine, '[ ]'$  Ticagrelor, '[ ]'$  Other, '[ ]'$  No for medical reason, '[ ]'$  Non-compliant, '[ ]'$  Not-indicated Anti-coagulant use:  No, '[ ]'$  Warfarin, '[ ]'$  Rivaroxaban, '[ ]'$  Dabigatran,

## 2021-11-28 ENCOUNTER — Telehealth: Payer: Self-pay

## 2021-11-28 DIAGNOSIS — C159 Malignant neoplasm of esophagus, unspecified: Secondary | ICD-10-CM | POA: Diagnosis not present

## 2021-11-28 DIAGNOSIS — Z48812 Encounter for surgical aftercare following surgery on the circulatory system: Secondary | ICD-10-CM | POA: Diagnosis not present

## 2021-11-28 DIAGNOSIS — E782 Mixed hyperlipidemia: Secondary | ICD-10-CM | POA: Diagnosis not present

## 2021-11-28 DIAGNOSIS — I10 Essential (primary) hypertension: Secondary | ICD-10-CM | POA: Diagnosis not present

## 2021-11-28 DIAGNOSIS — H548 Legal blindness, as defined in USA: Secondary | ICD-10-CM | POA: Diagnosis not present

## 2021-11-28 DIAGNOSIS — I2510 Atherosclerotic heart disease of native coronary artery without angina pectoris: Secondary | ICD-10-CM | POA: Diagnosis not present

## 2021-11-28 NOTE — Telephone Encounter (Signed)
Claiborne Billings RN with [phone] [facility] called stating that the pt was still having nausea and requested some nausea med. Pt is also having slight drainage and bleeding from his incision site and wanted to place a gauze or bandage on it.  Spoke to [place], [place] concerning nausea and she called the pt's daughter. Nausea was prior to surgery and she r/o'd any surgical possibilities. Instructed her to reach out to PCP.  Called Claiborne Billings RN and informed her of call. Instructed her to tell pt that he can use a bandage as long as he changes it often, keeps the site clean and dry, and doesn't let it stick when removing. Confirmed understanding.

## 2021-12-04 DIAGNOSIS — H548 Legal blindness, as defined in USA: Secondary | ICD-10-CM | POA: Diagnosis not present

## 2021-12-04 DIAGNOSIS — E782 Mixed hyperlipidemia: Secondary | ICD-10-CM | POA: Diagnosis not present

## 2021-12-04 DIAGNOSIS — I10 Essential (primary) hypertension: Secondary | ICD-10-CM | POA: Diagnosis not present

## 2021-12-04 DIAGNOSIS — Z48812 Encounter for surgical aftercare following surgery on the circulatory system: Secondary | ICD-10-CM | POA: Diagnosis not present

## 2021-12-04 DIAGNOSIS — I2510 Atherosclerotic heart disease of native coronary artery without angina pectoris: Secondary | ICD-10-CM | POA: Diagnosis not present

## 2021-12-04 DIAGNOSIS — C159 Malignant neoplasm of esophagus, unspecified: Secondary | ICD-10-CM | POA: Diagnosis not present

## 2021-12-06 DIAGNOSIS — I2510 Atherosclerotic heart disease of native coronary artery without angina pectoris: Secondary | ICD-10-CM | POA: Diagnosis not present

## 2021-12-06 DIAGNOSIS — H548 Legal blindness, as defined in USA: Secondary | ICD-10-CM | POA: Diagnosis not present

## 2021-12-06 DIAGNOSIS — C159 Malignant neoplasm of esophagus, unspecified: Secondary | ICD-10-CM | POA: Diagnosis not present

## 2021-12-06 DIAGNOSIS — Z48812 Encounter for surgical aftercare following surgery on the circulatory system: Secondary | ICD-10-CM | POA: Diagnosis not present

## 2021-12-06 DIAGNOSIS — I10 Essential (primary) hypertension: Secondary | ICD-10-CM | POA: Diagnosis not present

## 2021-12-06 DIAGNOSIS — E782 Mixed hyperlipidemia: Secondary | ICD-10-CM | POA: Diagnosis not present

## 2021-12-07 ENCOUNTER — Other Ambulatory Visit: Payer: Self-pay | Admitting: *Deleted

## 2021-12-07 DIAGNOSIS — R634 Abnormal weight loss: Secondary | ICD-10-CM | POA: Diagnosis not present

## 2021-12-07 DIAGNOSIS — I779 Disorder of arteries and arterioles, unspecified: Secondary | ICD-10-CM

## 2021-12-07 DIAGNOSIS — R110 Nausea: Secondary | ICD-10-CM | POA: Diagnosis not present

## 2021-12-07 DIAGNOSIS — J989 Respiratory disorder, unspecified: Secondary | ICD-10-CM | POA: Diagnosis not present

## 2021-12-07 DIAGNOSIS — Z6822 Body mass index (BMI) 22.0-22.9, adult: Secondary | ICD-10-CM | POA: Diagnosis not present

## 2021-12-08 DIAGNOSIS — H548 Legal blindness, as defined in USA: Secondary | ICD-10-CM | POA: Diagnosis not present

## 2021-12-08 DIAGNOSIS — I2510 Atherosclerotic heart disease of native coronary artery without angina pectoris: Secondary | ICD-10-CM | POA: Diagnosis not present

## 2021-12-08 DIAGNOSIS — E782 Mixed hyperlipidemia: Secondary | ICD-10-CM | POA: Diagnosis not present

## 2021-12-08 DIAGNOSIS — C159 Malignant neoplasm of esophagus, unspecified: Secondary | ICD-10-CM | POA: Diagnosis not present

## 2021-12-08 DIAGNOSIS — Z48812 Encounter for surgical aftercare following surgery on the circulatory system: Secondary | ICD-10-CM | POA: Diagnosis not present

## 2021-12-08 DIAGNOSIS — I10 Essential (primary) hypertension: Secondary | ICD-10-CM | POA: Diagnosis not present

## 2021-12-13 ENCOUNTER — Ambulatory Visit (INDEPENDENT_AMBULATORY_CARE_PROVIDER_SITE_OTHER): Payer: Medicare Other | Admitting: Physician Assistant

## 2021-12-13 ENCOUNTER — Ambulatory Visit (HOSPITAL_COMMUNITY)
Admission: RE | Admit: 2021-12-13 | Discharge: 2021-12-13 | Disposition: A | Discharge status: Home / Self Care | Payer: Medicare Other | Source: Ambulatory Visit | Attending: Vascular Surgery | Admitting: Vascular Surgery

## 2021-12-13 VITALS — BP 109/60 | HR 64 | Temp 98.0°F | Ht 68.0 in | Wt 144.0 lb

## 2021-12-13 DIAGNOSIS — I6522 Occlusion and stenosis of left carotid artery: Secondary | ICD-10-CM

## 2021-12-13 DIAGNOSIS — I6521 Occlusion and stenosis of right carotid artery: Secondary | ICD-10-CM

## 2021-12-13 DIAGNOSIS — I779 Disorder of arteries and arterioles, unspecified: Secondary | ICD-10-CM | POA: Diagnosis not present

## 2021-12-13 NOTE — Progress Notes (Signed)
Office Note     CC:  follow up Requesting Provider:  Lawerance Cruel, MD  HPI: Colin Villa is a 81 y.o. (09/20/1940) male who presents for post op from left TCAR on 11/21/21 for asymptomatic high grade stenosis. He presents today with his daughter. He says overall he is doing well since his surgery. His incision is mildly sore. He denies any changes in vision, slurred speech, facial drooping, unilateral upper or lower extremity weakness or numbness. He his legally blind. He additionally has hoarseness at baseline. This is unchanged. He does report episodes of dizziness when standing and ambulating. Per his daughter this has been present for over a year. He does report some nausea that has only been present since surgery. His PCP has been slowly eliminating some of his regular home medications to see if they are causing his nausea. His daughter also reports that just prior to his surgery he began having some restlessness in his legs and some other muscle twitching in his arms and face as well. She thought maybe this was just nerves approaching his surgery but this has continued on even after the surgery. He reports that it is at times uncomfortable in his legs, kind of like muscle spasms.   The pt is on a statin for cholesterol management.  The pt is on a daily aspirin.  Other AC:  Plavix The pt is not on medication for hypertension.   The pt is not diabetic.   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 Wonda Amis    Past Surgical History:  Procedure Laterality Date   [place]   medial collateral ligament   LEFT HEART CATH AND CORONARY ANGIOGRAPHY N/A 08/10/2016   Procedure: LEFT HEART CATH AND CORONARY ANGIOGRAPHY;  Surgeon: Troy Sine, MD;  Location: [facility];  Service: Cardiovascular;  Laterality: N/A;   LITHOTRIPSY  [place] Left 2010   THORACIC AORTOGRAM N/A 08/10/2016   Procedure: Thoracic Aortogram;  Surgeon: Troy Sine, MD;  Location: [facility];  Service: Cardiovascular;  Laterality: N/A;   TRANSCAROTID ARTERY REVASCULARIZATION  Left 11/21/2021   Procedure: Left Transcarotid Artery Revascularization;  Surgeon: Waynetta Sandy, MD;  Location: [facility];  Service: Vascular;  Laterality: Left;   ULTRASOUND GUIDANCE FOR VASCULAR ACCESS Right 11/21/2021   Procedure: ULTRASOUND GUIDANCE FOR VASCULAR ACCESS;  Surgeon: Waynetta Sandy, MD;  Location: [facility];  Service: Vascular;  Laterality: Right;    Social History   Socioeconomic History   Marital status: Married    Spouse name: [name]   Number of children: 2   Years of  education: 13   Highest education level: Not on file  Occupational History    Comment: retired  Tobacco Use   Smoking status: Former    Types: Cigarettes    Quit date: 01/27/1996    Years since quitting: 25.8   Smokeless tobacco: Never  Vaping Use   Vaping Use: Never used  Substance and Sexual Activity   Alcohol use: Yes    Comment: 4 beers weekly   Drug use: No   Sexual activity: Not on file  Other Topics Concern   Not on file  Social History Narrative   Not on file   Social Determinants of Health   Financial Resource Strain: Not on file  Food Insecurity: No Food Insecurity (11/21/2021)   Hunger Vital Sign     Worried About Running Out of Food in the Last Year: Never true    Ran Out of Food in the Last Year: Never true  Transportation Needs: No Transportation Needs (11/21/2021)   PRAPARE - Hydrologist (Medical): No    Lack of Transportation (Non-Medical): No  Physical Activity: Not on file  Stress: Not on file  Social Connections: Not on file  Intimate Partner Violence: Not At Risk (11/21/2021)   Humiliation, Afraid, Rape, and Kick questionnaire    Fear of Current or Ex-Partner: No    Emotionally Abused: No    Physically Abused: No    Sexually Abused: No    Family History  Problem Relation Age of Onset   Heart failure Mother    Melanoma Mother    Mental illness Mother    Heart failure Father    Cancer Brother        bladder   Diabetes Brother     Current Outpatient Medications  Medication Sig Dispense Refill   acetaminophen (TYLENOL) 500 MG tablet Take 500 mg by mouth every 6 (six) hours as needed for mild pain or moderate pain.     Ascorbic Acid (VITAMIN C) 1000 MG tablet Take 1,000 mg by mouth daily.     aspirin 81 MG EC tablet Take 162 mg by mouth daily.     atropine 1 % ophthalmic solution Place 1 drop into both eyes at bedtime.     B Complex Vitamins (VITAMIN-B COMPLEX PO) Take 1 capsule by mouth daily.     clopidogrel (PLAVIX) 75 MG tablet TAKE 1 TABLET(75 MG) BY MOUTH DAILY 30 tablet 6   diphenhydrAMINE (BENADRYL) 25 MG tablet Take 25-50 mg by mouth daily as needed for allergies.     escitalopram (LEXAPRO) 20 MG tablet Take 20 mg by mouth daily.     lamoTRIgine (LAMICTAL) 100 MG tablet Take 100 mg by mouth at bedtime. Take with 25 mg for a total of 125 mg at bedtime     lamoTRIgine (LAMICTAL) 25 MG tablet Take 25 mg by mouth See admin instructions. Take with 100 mg for a total of 125 mg at bedtime     pramoxine (CERAVE ITCH RELIEF) 1 % LOTN Apply 1 Application topically daily. On face and head     prednisoLONE acetate (PRED FORTE) 1 % ophthalmic  suspension Place 1 drop into both eyes 2 (two) times daily. Morning and bedtime. May use 2 extra times during the day if needed     pyrithione zinc (DANDRUFF SHAMPOO) 1 % shampoo Apply 1 Application topically every 3 (three) days.     rosuvastatin (CRESTOR) 40 MG tablet Take 1 tablet (40 mg total) by mouth daily.  Please make overdue appt with Dr. Marlou Porch before anymore refills. 1st attempt (Patient taking differently: Take 40 mg by mouth at bedtime.) 30 tablet 0   traZODone (DESYREL) 150 MG tablet Take 150 mg by mouth at bedtime.     No current facility-administered medications for this visit.    Allergies  Allergen Reactions   Dextromethorphan-Quinidine     Other reaction(s): Dizziness (intolerance)   Sulfa Antibiotics Other (See Comments)    Childhood allergy     REVIEW OF SYSTEMS:  '[X]'$  denotes positive finding, '[ ]'$  denotes negative finding Cardiac  Comments:  Chest pain or chest pressure:    Shortness of breath upon exertion:    Short of breath when lying flat:    Irregular heart rhythm:        Vascular    Pain in calf, thigh, or hip brought on by ambulation:    Pain in feet at night that wakes you up from your sleep:     Blood clot in your veins:    Leg swelling:         Pulmonary    Oxygen at home:    Productive cough:     Wheezing:         Neurologic    Sudden weakness in arms or legs:     Sudden numbness in arms or legs:     Sudden onset of difficulty speaking or slurred speech:    Temporary loss of vision in one eye:     Problems with dizziness:         Gastrointestinal    Blood in stool:     Vomited blood:         Genitourinary    Burning when urinating:     Blood in urine:        Psychiatric    Major depression:         Hematologic    Bleeding problems:    Problems with blood clotting too easily:        Skin    Rashes or ulcers:        Constitutional    Fever or chills:      PHYSICAL EXAMINATION:  Vitals:   12/13/21 1324 12/13/21 1325  BP:  96/60 109/60  Pulse: 64   Temp: 98 F (36.7 C)   TempSrc: Temporal   SpO2: 99%   Weight: 144 lb (65.3 kg)   Height: '5\' 8"'$  (1.727 m)     General:  WDWN in NAD; vital signs documented above; frequent blinking Gait: Normal HENT: WNL, normocephalic Pulmonary: normal non-labored breathing  Cardiac: regular HR, without  Murmurs without carotid bruit Vascular Exam/Pulses:2+ radial and distal pulses bilaterally. Moving all extremities without deficits. Tremor present in both legs Extremities: without ischemic changes, without Gangrene , without cellulitis; without open wounds;  Musculoskeletal: no muscle wasting or atrophy  Neurologic: A&O X 3;  No focal weakness or paresthesias are detected Psychiatric:  The pt has Normal affect.   Non-Invasive Vascular Imaging:   VAS Carotid Duplex: Summary:  Right Carotid: Evidence consistent with a total occlusion of the right ICA.   Left Carotid: Patent left ICA stent.   Vertebrals:  Bilateral vertebral arteries demonstrate antegrade flow.  Subclavians: Left subclavian artery was stenotic. Normal flow hemodynamics were seen in the right subclavian artery.    ASSESSMENT/PLAN:: 81 y.o. male here for follow up s/p Left TCAR on 11/21/21  by Dr. Donzetta Matters for asymptomatic high grade stenosis. He is doing well post operatively.  No new neurological symptoms. He has developed a tremor prior to his TCAR in his legs, arms and some facial as well. I recommended neurology referral. Patient and his daughter would like to think about it at this time but may call for referral if they change their mind.  -Duplex shows patent left ICA stent. Right ICA with known total occlusion. Normal flow in the vertebral arteries. Some stenosis of left subclavian with normal flow in right subclavian.  - PCP is doing trial of elimination of some medications to see if they are contributing to patients recent nausea. I recommend not stopping the Aspirin or Plavix at this time for at least  1 month post carotid stenting. Advised patient and his daughter to contact us for further instruction prior to discontinuing - continue Aspirin, Statin, Plavix - He will return in 9 months with repeat carotid duplex  Karoline Caldwell, PA-C [facility] [phone]  Clinic MD:   [facility]

## 2021-12-20 ENCOUNTER — Other Ambulatory Visit: Payer: Self-pay

## 2021-12-20 DIAGNOSIS — I6522 Occlusion and stenosis of left carotid artery: Secondary | ICD-10-CM

## 2021-12-20 DIAGNOSIS — I6521 Occlusion and stenosis of right carotid artery: Secondary | ICD-10-CM

## 2021-12-20 DIAGNOSIS — I779 Disorder of arteries and arterioles, unspecified: Secondary | ICD-10-CM

## 2021-12-25 DIAGNOSIS — F330 Major depressive disorder, recurrent, mild: Secondary | ICD-10-CM | POA: Diagnosis not present

## 2021-12-25 DIAGNOSIS — F411 Generalized anxiety disorder: Secondary | ICD-10-CM | POA: Diagnosis not present

## 2021-12-26 DIAGNOSIS — R110 Nausea: Secondary | ICD-10-CM | POA: Diagnosis not present

## 2021-12-26 DIAGNOSIS — K5900 Constipation, unspecified: Secondary | ICD-10-CM | POA: Diagnosis not present

## 2021-12-26 DIAGNOSIS — Z6822 Body mass index (BMI) 22.0-22.9, adult: Secondary | ICD-10-CM | POA: Diagnosis not present

## 2021-12-26 DIAGNOSIS — R634 Abnormal weight loss: Secondary | ICD-10-CM | POA: Diagnosis not present

## 2022-01-30 DIAGNOSIS — U071 COVID-19: Secondary | ICD-10-CM | POA: Diagnosis not present

## 2022-02-16 DIAGNOSIS — R0602 Shortness of breath: Secondary | ICD-10-CM | POA: Diagnosis not present

## 2022-02-16 DIAGNOSIS — R911 Solitary pulmonary nodule: Secondary | ICD-10-CM | POA: Diagnosis not present

## 2022-02-16 DIAGNOSIS — Z8501 Personal history of malignant neoplasm of esophagus: Secondary | ICD-10-CM | POA: Diagnosis not present

## 2022-02-16 DIAGNOSIS — Z923 Personal history of irradiation: Secondary | ICD-10-CM | POA: Diagnosis not present

## 2022-02-16 DIAGNOSIS — Z08 Encounter for follow-up examination after completed treatment for malignant neoplasm: Secondary | ICD-10-CM | POA: Diagnosis not present

## 2022-02-16 DIAGNOSIS — C154 Malignant neoplasm of middle third of esophagus: Secondary | ICD-10-CM | POA: Diagnosis not present

## 2022-03-06 DIAGNOSIS — Z8501 Personal history of malignant neoplasm of esophagus: Secondary | ICD-10-CM | POA: Diagnosis not present

## 2022-03-06 DIAGNOSIS — C154 Malignant neoplasm of middle third of esophagus: Secondary | ICD-10-CM | POA: Diagnosis not present

## 2022-03-06 DIAGNOSIS — R911 Solitary pulmonary nodule: Secondary | ICD-10-CM | POA: Diagnosis not present

## 2022-03-12 ENCOUNTER — Encounter: Payer: Self-pay | Admitting: Vascular Surgery

## 2022-03-16 DIAGNOSIS — C154 Malignant neoplasm of middle third of esophagus: Secondary | ICD-10-CM | POA: Diagnosis not present

## 2022-03-16 DIAGNOSIS — C342 Malignant neoplasm of middle lobe, bronchus or lung: Secondary | ICD-10-CM | POA: Diagnosis not present

## 2022-03-16 DIAGNOSIS — R259 Unspecified abnormal involuntary movements: Secondary | ICD-10-CM | POA: Diagnosis not present

## 2022-03-16 DIAGNOSIS — R911 Solitary pulmonary nodule: Secondary | ICD-10-CM | POA: Diagnosis not present

## 2022-03-16 DIAGNOSIS — C7931 Secondary malignant neoplasm of brain: Secondary | ICD-10-CM | POA: Diagnosis not present

## 2022-03-16 DIAGNOSIS — Z87891 Personal history of nicotine dependence: Secondary | ICD-10-CM | POA: Diagnosis not present

## 2022-03-16 DIAGNOSIS — C155 Malignant neoplasm of lower third of esophagus: Secondary | ICD-10-CM | POA: Diagnosis not present

## 2022-03-16 DIAGNOSIS — C7949 Secondary malignant neoplasm of other parts of nervous system: Secondary | ICD-10-CM | POA: Diagnosis not present

## 2022-03-20 DIAGNOSIS — C154 Malignant neoplasm of middle third of esophagus: Secondary | ICD-10-CM | POA: Diagnosis not present

## 2022-03-20 DIAGNOSIS — R259 Unspecified abnormal involuntary movements: Secondary | ICD-10-CM | POA: Diagnosis not present

## 2022-03-27 DIAGNOSIS — C342 Malignant neoplasm of middle lobe, bronchus or lung: Secondary | ICD-10-CM | POA: Diagnosis not present

## 2022-03-28 DIAGNOSIS — C158 Malignant neoplasm of overlapping sites of esophagus: Secondary | ICD-10-CM | POA: Diagnosis not present

## 2022-03-28 DIAGNOSIS — Z510 Encounter for antineoplastic radiation therapy: Secondary | ICD-10-CM | POA: Diagnosis not present

## 2022-03-28 DIAGNOSIS — C342 Malignant neoplasm of middle lobe, bronchus or lung: Secondary | ICD-10-CM | POA: Diagnosis not present

## 2022-03-29 DIAGNOSIS — C342 Malignant neoplasm of middle lobe, bronchus or lung: Secondary | ICD-10-CM | POA: Diagnosis not present

## 2022-03-29 DIAGNOSIS — Z510 Encounter for antineoplastic radiation therapy: Secondary | ICD-10-CM | POA: Diagnosis not present

## 2022-03-30 DIAGNOSIS — C3490 Malignant neoplasm of unspecified part of unspecified bronchus or lung: Secondary | ICD-10-CM | POA: Diagnosis not present

## 2022-03-30 DIAGNOSIS — C342 Malignant neoplasm of middle lobe, bronchus or lung: Secondary | ICD-10-CM | POA: Diagnosis not present

## 2022-03-30 DIAGNOSIS — Z510 Encounter for antineoplastic radiation therapy: Secondary | ICD-10-CM | POA: Diagnosis not present

## 2022-04-02 DIAGNOSIS — Z510 Encounter for antineoplastic radiation therapy: Secondary | ICD-10-CM | POA: Diagnosis not present

## 2022-04-02 DIAGNOSIS — C342 Malignant neoplasm of middle lobe, bronchus or lung: Secondary | ICD-10-CM | POA: Diagnosis not present

## 2022-04-03 DIAGNOSIS — C3490 Malignant neoplasm of unspecified part of unspecified bronchus or lung: Secondary | ICD-10-CM | POA: Diagnosis not present

## 2022-04-03 DIAGNOSIS — C3491 Malignant neoplasm of unspecified part of right bronchus or lung: Secondary | ICD-10-CM | POA: Diagnosis not present

## 2022-04-03 DIAGNOSIS — C7801 Secondary malignant neoplasm of right lung: Secondary | ICD-10-CM | POA: Diagnosis not present

## 2022-05-11 DIAGNOSIS — C3490 Malignant neoplasm of unspecified part of unspecified bronchus or lung: Secondary | ICD-10-CM | POA: Diagnosis not present

## 2022-05-11 DIAGNOSIS — C158 Malignant neoplasm of overlapping sites of esophagus: Secondary | ICD-10-CM | POA: Diagnosis not present

## 2022-05-21 DIAGNOSIS — F3341 Major depressive disorder, recurrent, in partial remission: Secondary | ICD-10-CM | POA: Diagnosis not present

## 2022-05-21 DIAGNOSIS — F411 Generalized anxiety disorder: Secondary | ICD-10-CM | POA: Diagnosis not present

## 2022-06-06 DIAGNOSIS — Z6820 Body mass index (BMI) 20.0-20.9, adult: Secondary | ICD-10-CM | POA: Diagnosis not present

## 2022-06-06 DIAGNOSIS — R110 Nausea: Secondary | ICD-10-CM | POA: Diagnosis not present

## 2022-06-06 DIAGNOSIS — D696 Thrombocytopenia, unspecified: Secondary | ICD-10-CM | POA: Diagnosis not present

## 2022-06-06 DIAGNOSIS — E039 Hypothyroidism, unspecified: Secondary | ICD-10-CM | POA: Diagnosis not present

## 2022-06-06 DIAGNOSIS — G4700 Insomnia, unspecified: Secondary | ICD-10-CM | POA: Diagnosis not present

## 2022-06-06 DIAGNOSIS — R634 Abnormal weight loss: Secondary | ICD-10-CM | POA: Diagnosis not present

## 2022-06-06 DIAGNOSIS — D500 Iron deficiency anemia secondary to blood loss (chronic): Secondary | ICD-10-CM | POA: Diagnosis not present

## 2022-06-06 DIAGNOSIS — Z79899 Other long term (current) drug therapy: Secondary | ICD-10-CM | POA: Diagnosis not present

## 2022-06-06 DIAGNOSIS — E782 Mixed hyperlipidemia: Secondary | ICD-10-CM | POA: Diagnosis not present

## 2022-06-06 DIAGNOSIS — Z1389 Encounter for screening for other disorder: Secondary | ICD-10-CM | POA: Diagnosis not present

## 2022-06-06 DIAGNOSIS — Z0000 Encounter for general adult medical examination without abnormal findings: Secondary | ICD-10-CM | POA: Diagnosis not present

## 2022-06-06 DIAGNOSIS — R7301 Impaired fasting glucose: Secondary | ICD-10-CM | POA: Diagnosis not present

## 2022-08-09 DIAGNOSIS — H33023 Retinal detachment with multiple breaks, bilateral: Secondary | ICD-10-CM | POA: Diagnosis not present

## 2022-08-09 DIAGNOSIS — H30133 Disseminated chorioretinal inflammation, generalized, bilateral: Secondary | ICD-10-CM | POA: Diagnosis not present

## 2022-08-09 DIAGNOSIS — H4312 Vitreous hemorrhage, left eye: Secondary | ICD-10-CM | POA: Diagnosis not present

## 2022-08-09 DIAGNOSIS — H5316 Psychophysical visual disturbances: Secondary | ICD-10-CM | POA: Diagnosis not present

## 2022-08-21 DIAGNOSIS — G4700 Insomnia, unspecified: Secondary | ICD-10-CM | POA: Diagnosis not present

## 2022-08-21 DIAGNOSIS — F411 Generalized anxiety disorder: Secondary | ICD-10-CM | POA: Diagnosis not present

## 2022-08-21 DIAGNOSIS — F3341 Major depressive disorder, recurrent, in partial remission: Secondary | ICD-10-CM | POA: Diagnosis not present

## 2022-09-07 DIAGNOSIS — C154 Malignant neoplasm of middle third of esophagus: Secondary | ICD-10-CM | POA: Diagnosis not present

## 2022-09-07 DIAGNOSIS — R918 Other nonspecific abnormal finding of lung field: Secondary | ICD-10-CM | POA: Diagnosis not present

## 2022-09-07 DIAGNOSIS — C159 Malignant neoplasm of esophagus, unspecified: Secondary | ICD-10-CM | POA: Diagnosis not present

## 2022-09-13 DIAGNOSIS — G2401 Drug induced subacute dyskinesia: Secondary | ICD-10-CM | POA: Diagnosis not present

## 2022-09-13 DIAGNOSIS — C154 Malignant neoplasm of middle third of esophagus: Secondary | ICD-10-CM | POA: Diagnosis not present

## 2022-09-13 DIAGNOSIS — I6522 Occlusion and stenosis of left carotid artery: Secondary | ICD-10-CM | POA: Diagnosis not present

## 2022-09-13 DIAGNOSIS — F32A Depression, unspecified: Secondary | ICD-10-CM | POA: Diagnosis not present

## 2022-09-13 DIAGNOSIS — H30133 Disseminated chorioretinal inflammation, generalized, bilateral: Secondary | ICD-10-CM | POA: Diagnosis not present

## 2022-09-19 ENCOUNTER — Ambulatory Visit (INDEPENDENT_AMBULATORY_CARE_PROVIDER_SITE_OTHER): Payer: Medicare Other | Admitting: Physician Assistant

## 2022-09-19 ENCOUNTER — Ambulatory Visit (HOSPITAL_COMMUNITY)
Admission: RE | Admit: 2022-09-19 | Discharge: 2022-09-19 | Disposition: A | Discharge status: Home / Self Care | Payer: Medicare Other | Source: Ambulatory Visit | Attending: Vascular Surgery | Admitting: Vascular Surgery

## 2022-09-19 VITALS — BP 100/70 | HR 70 | Temp 98.5°F | Wt 144.0 lb

## 2022-09-19 DIAGNOSIS — I779 Disorder of arteries and arterioles, unspecified: Secondary | ICD-10-CM | POA: Diagnosis not present

## 2022-09-19 DIAGNOSIS — I6521 Occlusion and stenosis of right carotid artery: Secondary | ICD-10-CM | POA: Diagnosis present

## 2022-09-19 DIAGNOSIS — I6522 Occlusion and stenosis of left carotid artery: Secondary | ICD-10-CM | POA: Diagnosis not present

## 2022-09-19 NOTE — Progress Notes (Unsigned)
Office Note   History of Present Illness   Colin Villa is a 82 y.o. (05-11-1940) male who presents for surveillance of carotid artery stenosis. He has a history of left TCAR on 11/21/2021 for asymptomatic high grade stenosis. He has a known right ICA occlusion.  The patient returns today for follow up. He denies any recent strokelike symptoms such as sudden weakness/numbness, slurred speech, dizziness, or facial drooping.   He is taking his aspirin and statin daily. He is no longer taking his plavix.   Current Outpatient Medications  Medication Sig Dispense Refill   acetaminophen (TYLENOL) 500 MG tablet Take 500 mg by mouth every 6 (six) hours as needed for mild pain or moderate pain.     Ascorbic Acid (VITAMIN C) 1000 MG tablet Take 1,000 mg by mouth daily.     aspirin 81 MG EC tablet Take 162 mg by mouth daily.     atropine 1 % ophthalmic solution Place 1 drop into both eyes at bedtime.     B Complex Vitamins (VITAMIN-B COMPLEX PO) Take 1 capsule by mouth daily.     clopidogrel (PLAVIX) 75 MG tablet TAKE 1 TABLET(75 MG) BY MOUTH DAILY 30 tablet 6   diphenhydrAMINE (BENADRYL) 25 MG tablet Take 25-50 mg by mouth daily as needed for allergies.     escitalopram (LEXAPRO) 20 MG tablet Take 20 mg by mouth daily.     lamoTRIgine (LAMICTAL) 100 MG tablet Take 100 mg by mouth at bedtime. Take with 25 mg for a total of 125 mg at bedtime     lamoTRIgine (LAMICTAL) 25 MG tablet Take 25 mg by mouth See admin instructions. Take with 100 mg for a total of 125 mg at bedtime     pramoxine (CERAVE ITCH RELIEF) 1 % LOTN Apply 1 Application topically daily. On face and head     prednisoLONE acetate (PRED FORTE) 1 % ophthalmic suspension Place 1 drop into both eyes 2 (two) times daily. Morning and bedtime. May use 2 extra times during the day if needed     pyrithione zinc (DANDRUFF SHAMPOO) 1 % shampoo Apply 1 Application topically every 3 (three) days.     rosuvastatin (CRESTOR) 40 MG tablet Take 1  tablet (40 mg total) by mouth daily. Please make overdue appt with Dr. Anne Fu before anymore refills. 1st attempt (Patient taking differently: Take 40 mg by mouth at bedtime.) 30 tablet 0   traZODone (DESYREL) 150 MG tablet Take 150 mg by mouth at bedtime.     No current facility-administered medications for this visit.    REVIEW OF SYSTEMS (negative unless checked):   Cardiac:  []  Chest pain or chest pressure? []  Shortness of breath upon activity? []  Shortness of breath when lying flat? []  Irregular heart rhythm?  Vascular:  []  Pain in calf, thigh, or hip brought on by walking? []  Pain in feet at night that wakes you up from your sleep? []  Blood clot in your veins? []  Leg swelling?  Pulmonary:  []  Oxygen at home? []  Productive cough? []  Wheezing?  Neurologic:  []  Sudden weakness in arms or legs? []  Sudden numbness in arms or legs? []  Sudden onset of difficult speaking or slurred speech? []  Temporary loss of vision in one eye? []  Problems with dizziness?  Gastrointestinal:  []  Blood in stool? []  Vomited blood?  Genitourinary:  []  Burning when urinating? []  Blood in urine?  Psychiatric:  []  Major depression  Hematologic:  []  Bleeding problems? []  Problems with blood  clotting?  Dermatologic:  []  Rashes or ulcers?  Constitutional:  []  Fever or chills?  Ear/Nose/Throat:  []  Change in hearing? []  Nose bleeds? []  Sore throat?  Musculoskeletal:  []  Back pain? []  Joint pain? []  Muscle pain?   Physical Examination   Vitals:   09/19/22 1450 09/19/22 1452  BP: 97/60 100/70  Pulse: 70   Temp: 98.5 F (36.9 C)   TempSrc: Temporal   SpO2: 94%   Weight: 144 lb (65.3 kg)    Body mass index is 21.9 kg/m.  General:  WDWN in NAD; vital signs documented above Gait: Not observed HENT: WNL, normocephalic Pulmonary: normal non-labored breathing , without rales, rhonchi,  wheezing Cardiac: regular Abdomen: soft, NT, no masses Skin: without rashes Vascular  Exam/Pulses: palpable radial pulses bilaterally Extremities: without ischemic changes, without gangrene , without cellulitis; without open wounds;  Musculoskeletal: no muscle wasting or atrophy  Neurologic: A&O X 3;  No focal weakness or paresthesias are detected Psychiatric:  The pt has Normal affect.  Non-Invasive Vascular Imaging   Bilateral Carotid Duplex (09/19/2022):  R ICA stenosis:  Occluded R VA:  patent and antegrade  Patent left ICA stent without stenosis. Patent and antegrade left vertebral artery.    Medical Decision Making   Colin Villa is a 82 y.o. male who presents for surveillance of carotid artery stenosis  Based on the patient's vascular studies, their carotid artery stenosis is unchanged. He has a known right ICA occlusion. His left ICA stent is patent without stenosis He denies any strokelike symptoms such as slurred speech, facial droop, or sudden weakness/numbness. Fortunately he is no longer dealing with nausea. This resolved naturally several months ago He does not have to take his Plavix anymore. He will continue his aspirin and statin and follow up with our office in 1 year with carotid duplex   Loel Dubonnet PA-C [facility] Office: [phone]  Clinic MD: Randie Heinz

## 2022-09-21 ENCOUNTER — Other Ambulatory Visit: Payer: Self-pay

## 2022-09-21 DIAGNOSIS — I6522 Occlusion and stenosis of left carotid artery: Secondary | ICD-10-CM

## 2022-10-10 DIAGNOSIS — E039 Hypothyroidism, unspecified: Secondary | ICD-10-CM | POA: Diagnosis not present

## 2022-11-09 DIAGNOSIS — Z23 Encounter for immunization: Secondary | ICD-10-CM | POA: Diagnosis not present

## 2022-11-19 DIAGNOSIS — F3341 Major depressive disorder, recurrent, in partial remission: Secondary | ICD-10-CM | POA: Diagnosis not present

## 2022-11-19 DIAGNOSIS — F411 Generalized anxiety disorder: Secondary | ICD-10-CM | POA: Diagnosis not present

## 2022-11-19 DIAGNOSIS — G4700 Insomnia, unspecified: Secondary | ICD-10-CM | POA: Diagnosis not present

## 2023-02-21 DIAGNOSIS — F411 Generalized anxiety disorder: Secondary | ICD-10-CM | POA: Diagnosis not present

## 2023-02-21 DIAGNOSIS — F3341 Major depressive disorder, recurrent, in partial remission: Secondary | ICD-10-CM | POA: Diagnosis not present

## 2023-02-21 DIAGNOSIS — G4700 Insomnia, unspecified: Secondary | ICD-10-CM | POA: Diagnosis not present

## 2023-03-19 DIAGNOSIS — D485 Neoplasm of uncertain behavior of skin: Secondary | ICD-10-CM | POA: Diagnosis not present

## 2023-03-19 DIAGNOSIS — L570 Actinic keratosis: Secondary | ICD-10-CM | POA: Diagnosis not present

## 2023-03-19 DIAGNOSIS — L821 Other seborrheic keratosis: Secondary | ICD-10-CM | POA: Diagnosis not present

## 2023-03-19 DIAGNOSIS — L814 Other melanin hyperpigmentation: Secondary | ICD-10-CM | POA: Diagnosis not present

## 2023-03-19 DIAGNOSIS — L578 Other skin changes due to chronic exposure to nonionizing radiation: Secondary | ICD-10-CM | POA: Diagnosis not present

## 2023-03-19 DIAGNOSIS — C44612 Basal cell carcinoma of skin of right upper limb, including shoulder: Secondary | ICD-10-CM | POA: Diagnosis not present

## 2023-03-19 DIAGNOSIS — D225 Melanocytic nevi of trunk: Secondary | ICD-10-CM | POA: Diagnosis not present

## 2023-03-19 DIAGNOSIS — C44319 Basal cell carcinoma of skin of other parts of face: Secondary | ICD-10-CM | POA: Diagnosis not present

## 2023-03-19 DIAGNOSIS — Z85828 Personal history of other malignant neoplasm of skin: Secondary | ICD-10-CM | POA: Diagnosis not present

## 2023-06-11 DIAGNOSIS — Z79899 Other long term (current) drug therapy: Secondary | ICD-10-CM | POA: Diagnosis not present

## 2023-06-11 DIAGNOSIS — D500 Iron deficiency anemia secondary to blood loss (chronic): Secondary | ICD-10-CM | POA: Diagnosis not present

## 2023-06-11 DIAGNOSIS — F329 Major depressive disorder, single episode, unspecified: Secondary | ICD-10-CM | POA: Diagnosis not present

## 2023-06-11 DIAGNOSIS — E782 Mixed hyperlipidemia: Secondary | ICD-10-CM | POA: Diagnosis not present

## 2023-06-11 DIAGNOSIS — R7301 Impaired fasting glucose: Secondary | ICD-10-CM | POA: Diagnosis not present

## 2023-06-11 DIAGNOSIS — G4700 Insomnia, unspecified: Secondary | ICD-10-CM | POA: Diagnosis not present

## 2023-06-11 DIAGNOSIS — D696 Thrombocytopenia, unspecified: Secondary | ICD-10-CM | POA: Diagnosis not present

## 2023-06-11 DIAGNOSIS — E039 Hypothyroidism, unspecified: Secondary | ICD-10-CM | POA: Diagnosis not present

## 2023-06-11 DIAGNOSIS — R110 Nausea: Secondary | ICD-10-CM | POA: Diagnosis not present

## 2023-06-11 DIAGNOSIS — Z6825 Body mass index (BMI) 25.0-25.9, adult: Secondary | ICD-10-CM | POA: Diagnosis not present

## 2023-06-11 DIAGNOSIS — H547 Unspecified visual loss: Secondary | ICD-10-CM | POA: Diagnosis not present

## 2023-06-11 DIAGNOSIS — Z0000 Encounter for general adult medical examination without abnormal findings: Secondary | ICD-10-CM | POA: Diagnosis not present

## 2023-07-22 DIAGNOSIS — G4700 Insomnia, unspecified: Secondary | ICD-10-CM | POA: Diagnosis not present

## 2023-07-22 DIAGNOSIS — F411 Generalized anxiety disorder: Secondary | ICD-10-CM | POA: Diagnosis not present

## 2023-07-22 DIAGNOSIS — F3341 Major depressive disorder, recurrent, in partial remission: Secondary | ICD-10-CM | POA: Diagnosis not present

## 2023-08-05 DIAGNOSIS — R410 Disorientation, unspecified: Secondary | ICD-10-CM | POA: Diagnosis not present

## 2023-08-05 DIAGNOSIS — R300 Dysuria: Secondary | ICD-10-CM | POA: Diagnosis not present

## 2023-08-05 DIAGNOSIS — Z6825 Body mass index (BMI) 25.0-25.9, adult: Secondary | ICD-10-CM | POA: Diagnosis not present

## 2023-11-27 DIAGNOSIS — C4491 Basal cell carcinoma of skin, unspecified: Secondary | ICD-10-CM | POA: Diagnosis not present

## 2023-11-27 DIAGNOSIS — C44622 Squamous cell carcinoma of skin of right upper limb, including shoulder: Secondary | ICD-10-CM | POA: Diagnosis not present

## 2023-11-27 DIAGNOSIS — D485 Neoplasm of uncertain behavior of skin: Secondary | ICD-10-CM | POA: Diagnosis not present

## 2023-12-23 DIAGNOSIS — F411 Generalized anxiety disorder: Secondary | ICD-10-CM | POA: Diagnosis not present

## 2023-12-23 DIAGNOSIS — F3341 Major depressive disorder, recurrent, in partial remission: Secondary | ICD-10-CM | POA: Diagnosis not present
